# Patient Record
Sex: Male | Born: 1940 | Race: Black or African American | Hispanic: No | Marital: Married | Smoking: Never smoker
Health system: Southern US, Community
[De-identification: ages and names within clinical notes are randomized; demographics above are authoritative.]

## PROBLEM LIST (undated history)

## (undated) DIAGNOSIS — N189 Chronic kidney disease, unspecified: Secondary | ICD-10-CM

## (undated) DIAGNOSIS — I6350 Cerebral infarction due to unspecified occlusion or stenosis of unspecified cerebral artery: Secondary | ICD-10-CM

## (undated) DIAGNOSIS — E785 Hyperlipidemia, unspecified: Secondary | ICD-10-CM

## (undated) DIAGNOSIS — J189 Pneumonia, unspecified organism: Secondary | ICD-10-CM

## (undated) DIAGNOSIS — N289 Disorder of kidney and ureter, unspecified: Secondary | ICD-10-CM

## (undated) DIAGNOSIS — I69959 Hemiplegia and hemiparesis following unspecified cerebrovascular disease affecting unspecified side: Secondary | ICD-10-CM

## (undated) DIAGNOSIS — R1312 Dysphagia, oropharyngeal phase: Secondary | ICD-10-CM

## (undated) DIAGNOSIS — I10 Essential (primary) hypertension: Secondary | ICD-10-CM

## (undated) DIAGNOSIS — J9610 Chronic respiratory failure, unspecified whether with hypoxia or hypercapnia: Secondary | ICD-10-CM

## (undated) DIAGNOSIS — I639 Cerebral infarction, unspecified: Secondary | ICD-10-CM

## (undated) DIAGNOSIS — R4701 Aphasia: Secondary | ICD-10-CM

## (undated) DIAGNOSIS — I69920 Aphasia following unspecified cerebrovascular disease: Secondary | ICD-10-CM

## (undated) HISTORY — PX: STOMACH SURGERY: SHX791

## (undated) HISTORY — DX: Cerebral infarction, unspecified: I63.9

## (undated) HISTORY — DX: Aphasia following unspecified cerebrovascular disease: I69.920

## (undated) HISTORY — DX: Chronic respiratory failure, unspecified whether with hypoxia or hypercapnia: J96.10

## (undated) HISTORY — DX: Hyperlipidemia, unspecified: E78.5

## (undated) HISTORY — DX: Chronic kidney disease, unspecified: N18.9

## (undated) HISTORY — DX: Disorder of kidney and ureter, unspecified: N28.9

## (undated) HISTORY — DX: Hemiplegia and hemiparesis following unspecified cerebrovascular disease affecting unspecified side: I69.959

## (undated) HISTORY — DX: Pneumonia, unspecified organism: J18.9

## (undated) HISTORY — DX: Cerebral infarction due to unspecified occlusion or stenosis of unspecified cerebral artery: I63.50

## (undated) HISTORY — DX: Dysphagia, oropharyngeal phase: R13.12

---

## 2000-05-29 ENCOUNTER — Inpatient Hospital Stay (HOSPITAL_COMMUNITY): Admission: EM | Admit: 2000-05-29 | Discharge: 2000-05-30 | Payer: Self-pay | Admitting: *Deleted

## 2000-05-30 ENCOUNTER — Encounter: Payer: Self-pay | Admitting: *Deleted

## 2002-03-17 ENCOUNTER — Emergency Department (HOSPITAL_COMMUNITY): Admission: EM | Admit: 2002-03-17 | Discharge: 2002-03-17 | Payer: Self-pay | Admitting: Emergency Medicine

## 2002-03-17 ENCOUNTER — Encounter: Payer: Self-pay | Admitting: *Deleted

## 2002-03-31 ENCOUNTER — Emergency Department (HOSPITAL_COMMUNITY): Admission: EM | Admit: 2002-03-31 | Discharge: 2002-03-31 | Payer: Self-pay | Admitting: Emergency Medicine

## 2002-04-11 ENCOUNTER — Inpatient Hospital Stay (HOSPITAL_COMMUNITY): Admission: EM | Admit: 2002-04-11 | Discharge: 2002-04-30 | Payer: Self-pay | Admitting: Emergency Medicine

## 2002-04-11 ENCOUNTER — Encounter (INDEPENDENT_AMBULATORY_CARE_PROVIDER_SITE_OTHER): Payer: Self-pay | Admitting: *Deleted

## 2002-04-11 ENCOUNTER — Encounter: Payer: Self-pay | Admitting: Emergency Medicine

## 2002-04-11 ENCOUNTER — Encounter: Payer: Self-pay | Admitting: General Surgery

## 2002-04-16 ENCOUNTER — Encounter: Payer: Self-pay | Admitting: General Surgery

## 2002-04-25 ENCOUNTER — Encounter: Payer: Self-pay | Admitting: General Surgery

## 2005-08-12 ENCOUNTER — Ambulatory Visit (HOSPITAL_COMMUNITY): Admission: RE | Admit: 2005-08-12 | Discharge: 2005-08-12 | Payer: Self-pay | Admitting: Internal Medicine

## 2008-03-03 ENCOUNTER — Ambulatory Visit (HOSPITAL_COMMUNITY): Admission: RE | Admit: 2008-03-03 | Discharge: 2008-03-03 | Payer: Self-pay | Admitting: Internal Medicine

## 2008-05-26 ENCOUNTER — Ambulatory Visit: Payer: Self-pay | Admitting: Internal Medicine

## 2008-06-15 ENCOUNTER — Encounter: Payer: Self-pay | Admitting: Internal Medicine

## 2008-06-15 ENCOUNTER — Ambulatory Visit (HOSPITAL_COMMUNITY): Admission: RE | Admit: 2008-06-15 | Discharge: 2008-06-15 | Payer: Self-pay | Admitting: Internal Medicine

## 2008-06-15 ENCOUNTER — Ambulatory Visit: Payer: Self-pay | Admitting: Internal Medicine

## 2008-09-20 ENCOUNTER — Ambulatory Visit: Payer: Self-pay | Admitting: Internal Medicine

## 2010-07-04 ENCOUNTER — Encounter (INDEPENDENT_AMBULATORY_CARE_PROVIDER_SITE_OTHER): Payer: Self-pay | Admitting: Internal Medicine

## 2010-07-04 ENCOUNTER — Ambulatory Visit: Payer: Self-pay | Admitting: Cardiology

## 2010-07-04 ENCOUNTER — Inpatient Hospital Stay (HOSPITAL_COMMUNITY): Admission: EM | Admit: 2010-07-04 | Discharge: 2010-07-09 | Payer: Self-pay | Admitting: Emergency Medicine

## 2010-07-09 ENCOUNTER — Inpatient Hospital Stay: Admission: AD | Admit: 2010-07-09 | Discharge: 2010-07-16 | Payer: Self-pay | Admitting: Internal Medicine

## 2010-11-15 ENCOUNTER — Encounter (HOSPITAL_COMMUNITY): Payer: Self-pay

## 2010-11-15 ENCOUNTER — Other Ambulatory Visit (HOSPITAL_COMMUNITY): Payer: Self-pay | Admitting: Internal Medicine

## 2010-11-15 ENCOUNTER — Ambulatory Visit (HOSPITAL_COMMUNITY)
Admission: RE | Admit: 2010-11-15 | Discharge: 2010-11-15 | Disposition: A | Discharge status: Home / Self Care | Payer: Medicare HMO | Source: Ambulatory Visit | Attending: Internal Medicine | Admitting: Internal Medicine

## 2010-11-15 DIAGNOSIS — R059 Cough, unspecified: Secondary | ICD-10-CM | POA: Insufficient documentation

## 2010-11-15 DIAGNOSIS — R05 Cough: Secondary | ICD-10-CM

## 2010-11-15 HISTORY — DX: Essential (primary) hypertension: I10

## 2010-12-26 LAB — GLUCOSE, CAPILLARY
Glucose-Capillary: 178 mg/dL — ABNORMAL HIGH (ref 70–99)
Glucose-Capillary: 190 mg/dL — ABNORMAL HIGH (ref 70–99)
Glucose-Capillary: 205 mg/dL — ABNORMAL HIGH (ref 70–99)

## 2010-12-27 LAB — GLUCOSE, CAPILLARY
Glucose-Capillary: 112 mg/dL — ABNORMAL HIGH (ref 70–99)
Glucose-Capillary: 125 mg/dL — ABNORMAL HIGH (ref 70–99)
Glucose-Capillary: 131 mg/dL — ABNORMAL HIGH (ref 70–99)
Glucose-Capillary: 131 mg/dL — ABNORMAL HIGH (ref 70–99)
Glucose-Capillary: 131 mg/dL — ABNORMAL HIGH (ref 70–99)
Glucose-Capillary: 141 mg/dL — ABNORMAL HIGH (ref 70–99)
Glucose-Capillary: 141 mg/dL — ABNORMAL HIGH (ref 70–99)
Glucose-Capillary: 158 mg/dL — ABNORMAL HIGH (ref 70–99)
Glucose-Capillary: 163 mg/dL — ABNORMAL HIGH (ref 70–99)
Glucose-Capillary: 165 mg/dL — ABNORMAL HIGH (ref 70–99)
Glucose-Capillary: 166 mg/dL — ABNORMAL HIGH (ref 70–99)
Glucose-Capillary: 175 mg/dL — ABNORMAL HIGH (ref 70–99)
Glucose-Capillary: 175 mg/dL — ABNORMAL HIGH (ref 70–99)
Glucose-Capillary: 201 mg/dL — ABNORMAL HIGH (ref 70–99)
Glucose-Capillary: 238 mg/dL — ABNORMAL HIGH (ref 70–99)

## 2010-12-27 LAB — LIPID PANEL
Cholesterol: 121 mg/dL (ref 0–200)
Total CHOL/HDL Ratio: 3 RATIO
VLDL: 16 mg/dL (ref 0–40)

## 2010-12-27 LAB — POCT I-STAT, CHEM 8
BUN: 33 mg/dL — ABNORMAL HIGH (ref 6–23)
Chloride: 112 mEq/L (ref 96–112)
Potassium: 4.7 mEq/L (ref 3.5–5.1)
Sodium: 139 mEq/L (ref 135–145)
TCO2: 20 mmol/L (ref 0–100)

## 2010-12-27 LAB — DIFFERENTIAL
Basophils Absolute: 0 10*3/uL (ref 0.0–0.1)
Eosinophils Relative: 2 % (ref 0–5)
Lymphocytes Relative: 8 % — ABNORMAL LOW (ref 12–46)
Neutro Abs: 9.7 10*3/uL — ABNORMAL HIGH (ref 1.7–7.7)
Neutrophils Relative %: 85 % — ABNORMAL HIGH (ref 43–77)

## 2010-12-27 LAB — POCT CARDIAC MARKERS: Myoglobin, poc: 80.6 ng/mL (ref 12–200)

## 2010-12-27 LAB — CBC
Platelets: 180 10*3/uL (ref 150–400)
RDW: 13.4 % (ref 11.5–15.5)
WBC: 11.4 10*3/uL — ABNORMAL HIGH (ref 4.0–10.5)

## 2010-12-27 LAB — COMPREHENSIVE METABOLIC PANEL
BUN: 21 mg/dL (ref 6–23)
CO2: 28 mEq/L (ref 19–32)
Calcium: 9.5 mg/dL (ref 8.4–10.5)
Creatinine, Ser: 2.2 mg/dL — ABNORMAL HIGH (ref 0.4–1.5)
GFR calc Af Amer: 36 mL/min — ABNORMAL LOW (ref >60)
GFR calc non Af Amer: 30 mL/min — ABNORMAL LOW (ref >60)
Glucose, Bld: 139 mg/dL — ABNORMAL HIGH (ref 70–99)

## 2010-12-27 LAB — VITAMIN B12: Vitamin B-12: 469 pg/mL (ref 211–911)

## 2011-02-26 NOTE — Op Note (Signed)
Brady Frazier, Brady Frazier              ACCOUNT NO.:  [id number]   MEDICAL RECORD NO.:  [id number]          PATIENT TYPE:  AMB   LOCATION:  DAY                           FACILITY:  [facility]   PHYSICIAN:  R. Roetta Sessions, M.D. DATE OF BIRTH:  November 16, 1940   DATE OF PROCEDURE:  06/15/2008  DATE OF DISCHARGE:                               OPERATIVE REPORT   PROCEDURE:  Colonoscopy with biopsy.   INDICATIONS FOR PROCEDURE:  A 70 year old gentleman with chronic  constipation of at least 2 years' duration.  He has intermittent low-  volume hematochezia setting with straining.  He has never had his lower  GI tract evaluated.  There is no family history of colorectal neoplasia.  Colonoscopy is now being done.  Risks, benefits, alternatives, and  limitations have been reviewed, questions answered, and he is agreeable.  Please see the documentation in the medical record.   PROCEDURE NOTE:  O2 saturation, blood pressure, pulse, and respirations  were monitored throughout the entire procedure.   CONSCIOUS SEDATION:  Versed 3 mg IV and Demerol 75 mg IV in divided  doses.   INSTRUMENT:  Pentax video chip system.   FINDINGS:  Digital rectal exam revealed no abnormalities.  Endoscopic  findings:  Prep was good.  Colon:  Colonic mucosa was surveyed from the  rectosigmoid junction through the left transverse, right colon,  appendiceal orifice, ileocecal valve, and cecum.  These structures were  well seen and photographed for the record.  From this level, scope was  slowly withdrawn.  All previously mentioned mucosal surfaces were again  cautiously reviewed.  The colonic mucosa appeared normal aside from a  single 2-mm polyp in the mid sigmoid colon, which was cold  biopsied/removed.  The scope was pulled down the rectum with thorough  examination of the rectal mucosa, including an en face view of the anal  canal demonstrated friable anal canal tissue, otherwise it was  unremarkable.  The patient tolerated  the procedure well and was reacted  to Endoscopy.   IMPRESSION:  Friable anal canal, otherwise normal rectum.  Diminutive  mid sigmoid polyp status post cold biopsy removal.  The remainder of  colonic mucosa appeared normal.   RECOMMENDATIONS:  1. Constipation/high-fiber diet literature provided to [name].      Daily fiber supplement with Benefiber and Metamucil.  2. MiraLax 17 g orally at bedtime p.r.n. constipation.  3. Avoid straining.  4. Follow up on path.  5. Further recommendations to follow.      Jonathon Bellows, M.D.  Electronically Signed     RMR/MEDQ  D:  06/15/2008  T:  06/15/2008  Job:  [id number]   cc:   Kingsley Callander. Ouida Sills, MD  Fax: [phone]

## 2011-02-26 NOTE — Assessment & Plan Note (Signed)
Brady Frazier, Brady Frazier                 CHART#:  [id number]   DATE:  09/20/2008                       DOB:  [birth year]   CHIEF COMPLAINT:  Followup constipation.   SUBJECTIVE:  The patient is here for 55-months followup of recent  colonoscopy.  He has a history of chronic constipation with intermittent  low- volume hematochezia in the setting of straining.  Colonoscopy in  September 2009, revealed friable anal canal, diminutive mid sigmoid  polyp, which was hyperplastic.  He was started on MiraLax and fiber  supplement.  He states that he is having daily bowel movements and is  using MiraLax about twice a week.  He uses fiber p.r.n. as well.  He is  very pleased with his bowel function at this time.  Denies any blood in  the stool or melena.  No abdominal pain, nausea, vomiting, heartburn,  dysphagia, or odynophagia.   CURRENT MEDICATIONS:  See updated list.   ALLERGIES:  No known drug allergies.   PHYSICAL EXAMINATION:  VITAL SIGNS:  Weight 196, stable; temp 97.8;  blood pressure 120/68; and pulse 72.  GENERAL:  A very pleasant, elderly gentleman in no acute distress.  SKIN:  Warm and dry.  No jaundice.  HEENT:  Sclerae nonicteric.  Oropharyngeal mucosa moist and pink.  ABDOMEN:  Positive bowel sounds.  Abdomen is soft, nontender, and  nondistended.   IMPRESSION:  The patient is a 70 year old gentleman with chronic  constipation, doing very well on p.r.n. MiraLax.  He has had no further  hematochezia.   PLAN:  1. Colonoscopy in 10 years, health permitting.  2. Continue MiraLax 17 g daily p.r.n.  3. Continue fiber supplement daily.  4. Office visit on an as needed basis.       Tana Coast, P.A.  Electronically Signed     R. Roetta Sessions, M.D.  Electronically Signed    LL/MEDQ  D:  09/20/2008  T:  09/21/2008  Job:  [id number]   cc:   Kingsley Callander. Ouida Sills, MD

## 2011-02-26 NOTE — Consult Note (Signed)
Brady Frazier, Brady Frazier              ACCOUNT NO.:  [id number]   MEDICAL RECORD NO.:  [id number]          PATIENT TYPE:  AMB   LOCATION:  DAY                           FACILITY:  [facility]   PHYSICIAN:  R. Roetta Sessions, M.D. DATE OF BIRTH:  23-May-1941   DATE OF CONSULTATION:  05/26/2008  DATE OF DISCHARGE:                                 CONSULTATION   REFERRING PHYSICIAN:  Kingsley Callander. Ouida Sills, MD   REASON FOR CONSULTATION:  Constipation/colonoscopy.   HISTORY OF PRESENT ILLNESS:  Brady Frazier is a 69 year old African  American male with type 2 diabetes mellitus referred out of  the  courtesy of Dr. Carylon Perches for further evaluation of constipation and  consideration of colonoscopy.  Brady Frazier has never had his lower GI  tract evaluated.  He has had a couple of years' history of constipation.  He has difficulty evacuating and initiating a bowel movement.  He has a  bowel movement only three times weekly or every other day.  He has tried  a variety of laxatives and stool softeners with varying results.  He  does have occasional small-volume rectal bleeding when he is attempting  to have a bowel movement.  There is no family history of colorectal  neoplasia.  He has not lost any weight.  According to Dr. Alonza Smoker  records, he was Hemoccult negative back in May 2009.  He has had some  vague reflux symptoms in the past treated with Nexium; those were self-  limiting, and he is not on aspiration therapy now.  He denies  odynophagia, dysphagia, early satiety, reflux symptoms, nausea,  vomiting, abdominal pain, or melena currently.  He has not really ever  had any diarrhea.   PAST MEDICAL HISTORY:  1. Significant for type 2 diabetes mellitus.  2. Hypertension.  3. 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  4. History of anemia.   PAST SURGERIES:  Cholecystectomy and laparotomy for what sounds like  small bowel obstruction.   CURRENT MEDICATIONS:  1. Plendil 5 mg daily.  2.  Lisinopril 10 mg daily.  3. Metformin 500 mg b.i.d.  4. Glipizide b.i.d.  5. ASA 81 mg daily.  6. Fish oil daily.   ALLERGIES:  No known drug allergies.   FAMILY HISTORY:  Negative for chronic GI or liver illness.  Father is  deceased, cause unknown.  Mother died at age 34 with diabetes-related  problems.   SOCIAL HISTORY:  The patient is married.  He has 4 children.  He is not  employed.  No tobacco.  No alcohol.  No illicit drugs.   REVIEW OF SYSTEMS:  GI:  As above.  No recent chest pain or dyspnea on  exertion.  No fever or chills.  No change in weight.   PHYSICAL EXAMINATION:  GENERAL:  Pleasant 70 year old gentleman resting  comfortably.  VITAL SIGNS:  Weight 195, height 5 feet 6-1/2 inches, temperature 98.2,  BP 118/80, and pulse 72.  SKIN:  Warm and dry.  HEENT:  No scleral icterus.  CHEST:  Lungs are clear to auscultation.  CARDIAC:  Regular  rate and rhythm without murmur, gallop, or rub.  ABDOMEN:  Nondistended.  Well-healed midline laparotomy scar.  Positive  bowel sounds.  Soft and nontender without appreciable mass or  hepatosplenomegaly.  EXTREMITIES:  No edema.  RECTAL:  Deferred at the time of colonoscopy.   IMPRESSION:  Brady Frazier is a 70 year old gentleman with at least  2-year history of constipation.  He has intermittent low-volume  hematochezia at the time of bowel movements with occasional straining.  I agree with Dr. Ouida Sills since he has never had his lower GI tract  evaluated, he needs a colonoscopy run off the back.  Risks, benefits,  alternatives, and limitations have been reviewed.  Brady Frazier  questions are answered.  He is agreeable.  We will plan to perform a  diagnostic colonoscopy in the very near future and then we will make  further recommendations relating to the management of his constipation.   I would like to thank Dr. Carylon Perches for allowing me to see this very  nice gentleman today.      Jonathon Bellows, M.D.   Electronically Signed     RMR/MEDQ  D:  05/26/2008  T:  05/27/2008  Job:  [id number]   cc:   Kingsley Callander. Ouida Sills, MD  Fax: [phone]

## 2011-03-01 NOTE — Discharge Summary (Signed)
Brady Frazier, Brady Frazier                          ACCOUNT NO.:  [id number]   MEDICAL RECORD NO.:  [id number]                   PATIENT TYPE:  INP   LOCATION:  2019                                 FACILITY:  [facility]   PHYSICIAN:  Margaretann Loveless, M.D.              DATE OF BIRTH:  [birth year]   DATE OF ADMISSION:  04/11/2002  DATE OF DISCHARGE:  04/30/2002                                 DISCHARGE SUMMARY   DISCHARGE DIAGNOSES:  1. Abdominal pain secondary to gallstone pancreatitis.  Transferred to [facility] for surgery.  2. Diabetes with Accu-Cheks well controlled on glipizide 5 mg b.i.d.  3. Hypercholesterolemia, on Lipitor during this hospitalization.  4. Hypertension with blood pressure well controlled on Altace.  5. Anemia with a hemoglobin of 11.1 during this hospitalization.  No workup     done as patient was transferred to [facility].  Workup can be done there     if indicated.   DISCHARGE MEDICATIONS:  1. Glipizide 5 mg t.i.d.  2. Lipitor 10 mg q.h.s.  3. Enteric-coated aspirin 325 mg q.d., which is on hold at this time.  4. Altace 5 mg p.o. q. a.m.  5. Glucophage, which is on hold at this time.   DISPOSITION:  The patient will be transferred to [facility] for surgery  secondary to gallstone pancreatitis.   HISTORY AND PHYSICAL:  Please refer to dictated history and physical.   LABORATORY DATA:  The patient's white cell count on admission was 16.2, his  hemoglobin was 11.1, platelets 271,000.  His chemistry panel showed his  blood sugar elevated at 170, BUN 17, creatinine 1.4.  The electrolytes were  all normal.  His AST 243, ALT 149, alkaline phosphatase 98, and total bili  1.6, amylase 159, lipase 73.  His blood cultures showed no growth.   DIAGNOSTIC DATA:  CT scan showed gallstones with evidence consistent with  gallbladder wall thickening raising the question of acute cholecystitis.  It  also showed questionable inflammation of the distal aspect of the  appendix.   HOSPITAL COURSE:  Problem #1 - Gallstone pancreatitis:  On admission, the  patient had abdominal pain worrisome for gallstones.  His CT scan did show  gallstones and a thickened gallbladder wall consistent with cholecystitis.  His amylase and lipase were elevated consistent with gallstone pancreatitis.  General surgery consultation was obtained and Dr. Lovell Sheehan agreed with the  Unasyn and IV hydration.  He scheduled the patient for laparoscopic  cholecystectomy with cholangiogram but the patient wished this to be done in  [place] at [facility].  He was subsequently transferred to [facility] for  surgery.   Problem #2 - Questionable appendicitis:  This issue will be dealt with by  his surgeon in [place].   Problem #3 - Diabetes.  He was maintained on glipizide and his blood sugars  were stable on sliding scale insulin and Accu-Cheks.  Problem #4 - Hypertension:  Blood pressure stable on Altace.                                               Margaretann Loveless, M.D.    RLJ/MEDQ  D:  05/16/2002  T:  05/20/2002  Job:  [id number]   cc:   Kingsley Callander. Ouida Sills, M.D.

## 2011-03-01 NOTE — Discharge Summary (Signed)
[place]. [facility]  Patient:    Brady Frazier, Brady Frazier Visit Number: [id number] MRN: [id number]          Service Type: MED Location: 2000 2019 01 Attending Physician:  Jackie Plum Dictated by:   Vikki Ports, M.D. Admit Date:  04/11/2002 Discharge Date: 04/30/2002   CC:         Dr. Gray Bernhardt in [place], [place]   Discharge Summary  ADMISSION DIAGNOSIS:  Biliary pancreatitis.  DISCHARGE DIAGNOSES: 1. Biliary pancreatitis. 2. Hypertension. 3. Pulmonary embolus. 4. Non-insulin dependent diabetes mellitus.  PROCEDURES PERFORMED: 1. Laparoscopic cholecystectomy. 2. Exploratory laparotomy with lysis of adhesions.  CONDITION ON DISCHARGE:  Good and improved.  DISPOSITION:  The patient is discharged to home.  HISTORY OF PRESENT ILLNESS:  The patient is a 70 year old male who was seen at [facility] on April 10, 2002, with complaints of abdominal pain. An ultrasound and a CT scan showed multiple gallstones and minimal inflammation of the pancreas but a thickened gallbladder wall. The patient requested transfer down to [facility]. I accepted the patient in transfer.  HOSPITAL COURSE:  He was admitted and placed on bowel rest, IV antibiotics and IV hydration. His temperature came down from 102 to 100.6 by hospital day #2. On the following day he underwent laparoscopic cholecystectomy with intraoperative cholangiogram.  On postoperative day #1 the patient developed fever to 104.2 and had a mildly distended abdomen. White count was 12,000. On postoperative day #2 his temperature came down to 101.3. He was continued on antibiotics.  On postoperative day #3 the patient was vomiting. He had marked distention of the abdomen. He underwent a CT scan which showed umbilical hernia with small bowel involvement in a transition zone. The patient also had developed renal insufficiency, oliguria, and was transferred to the intensive care unit  and taken to the operating room by Dr. Magnus Ivan, where he underwent exploratory laparotomy and repair of hernia. Postoperatively he was maintained on IV hydration. His creatinine slowly decreased from a peak of 5.6 down to over the next few weeks 1.5.  His bowel function was slow to return. His nasogastric tube was pulled out on postoperative day #2. He was started on only a few sips of clears. He was transferred back to the floor. His creatinine continued to improve, however, he was having uncontrolled hypertension and was started on Tenormin. He continued to have uncontrolled hypertension and the hospitalists were consulted. They started the patient on Norvasc and a Catapres patch, which controlled his hypertension nicely.  Over the following few days he continued to improve. He was tolerating a regular diet. However, on postoperative day #9, the patient had significant shortness of breath, was slightly confused. He had decreased oxygen saturations at 90% on 2 liters. The hospitalists requested a V/Q scan which showed high probability for a pulmonary embolism bilaterally. The patient was started on Lovenox and Coumadin, and required about 4 or 5 days of bimodal therapy until he was therapeutic with an INR of 2.1 on April 30, 2002. At this point he was ready for discharge to home.  DISCHARGE MEDICATIONS: 1. Tenormin 100 mg q.d. 2. Norvasc 10 mg q.d. 3. Coumadin 5 mg q.d. 4. We were holding Altace and glucophage until his primary doctor can see him. 5. Continues on Glipizide 10 mg q.d. 6. Aspirin 81 mg q.d. 7. Lipitor 10 mg q.d.  FOLLOWUP: Followup is with me in two weeks and with Dr. Gray Bernhardt in [place]. Dictated by:   Nicholaus Corolla  Luan Pulling, M.D. Attending Physician:  Jackie Plum DD:  04/30/02 TD:  05/05/02 Job: [id number]

## 2011-03-01 NOTE — H&P (Signed)
[place]. [facility]  Patient:    Brady Frazier, Brady Frazier Visit Number: [id number] MRN: [id number]          Service Type: MED Location: 2000 2019 01 Attending Physician:  Jackie Plum Dictated by:   Renne Musca, M.D. Admit Date:  04/11/2002 Discharge Date: 04/30/2002                           History and Physical  DATE OF BIRTH:  21-Feb-1941  HISTORY OF PRESENT ILLNESS:  The patient is a 70 year old African American gentleman with a past medical history remarkable for diabetes mellitus, history of CVA approximately two years prior with no residual, who presents with a four- to five-week history of worsening abdominal pain with nausea and vomiting.  Initially, the pain was intermittent.  He was seen in the emergency room at [facility] two weeks prior and was diagnosed with diverticulitis.  At that time, his white count was normal.  Because of ongoing symptoms including difficulty maintaining p.o.s, he was seen in the [facility] ER one week ago; no labs are in the computer.  However, he did note he had seen a small amount of blood per rectum at that time and continued to feel poorly, though he had no diarrhea.  He was scheduled for colonoscopy at that time, once his "stomach healed."  He was told he had gastritis.  The pain has continued.  He generally feels poor and weak.  He is not able to maintain any p.o.s; he, however, did not vomit again until yesterday after drinking apple juice and an apple.  He has not worked during this time due to the ongoing symptoms and generally feeling poorly.  He is quite active as a Scientist, water quality.  Tonight, he presents with these symptoms including the nausea and vomiting.  He has had no fever or chills, no diarrhea, no shortness of breath, no chest pain or cardiovascular-type symptoms.  His white count tonight is 16,000 with a left shift.  His hemoglobin is 11, although this is not significantly changed, though I  did not have any labs from prior to June.  In addition, his transaminases are markedly elevated with SGPT of ______, SGOT of 243, SGPT of 149.  The patient is also noted to have lipase of 73, amylase 159 with a BUN of 17 and creatinine of 1.4.  Wife has noted his glucoses have been moderately elevated compared to his baseline over this period.  CT scan reveals a suspected gallbladder wall thickening and some small stones, as well as thickening of the appendix as well and non-filling consistent with appendicitis.  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 Glucophage 500 mg b.i.d. 2. Glipizide 10 mg b.i.d. 3. Lipitor 10 mg daily. 4. Aspirin. 5. Altace 5 mg daily.  SOCIAL HISTORY:  No alcohol.  No tobacco.  He is married.  He has four children who are alive and well.  He is a Scientist, water quality.  FAMILY MEDICAL HISTORY:  Positive for diabetes, hypertension and colon cancer. He has four siblings, I believe.  PAST MEDICAL HISTORY: 1. Diabetes mellitus.  Control has been good per the patients wife. 2. Hypertension. 3. CVA -- manifest as peripheral vision loss which had resolved.  ALLERGIES:  None known.  PHYSICAL EXAMINATION:  VITAL SIGNS:  Blood pressure is 120/70.  Pulse is 84 and regular.  GENERAL:  He is lying supine, in no  distress now after 8 mg of morphine.  NECK:  Supple.  There is no JVD, adenopathy or thyromegaly.  LUNGS:  Clear to auscultation anteriorly and posteriorly.  Respiratory rate is 14.  ABDOMEN:  Protuberant.  Hyperactive bowel sounds.  Mild diffuse tenderness but more marked tenderness in the right upper quadrant and right mid-lower abdomen.  No frank rebound is noted.  EXTREMITIES:  Without clubbing, cyanosis, or edema.  There are good distal pulses bilaterally.  NEUROLOGIC:  Exam is nonfocal.  Gait is not tested.  The patient is awake, easily arouses.  SKIN:  Without rash, lesion or breakdown.  ASSESSMENT AND PLAN:   Abdominal pain with increased transaminases, increased pancreatic enzymes, nausea and vomiting with a question of passed gallstone as well as appendix changes on CT scan.  Surgical consultation has been obtained. Empiric Unasyn.  Will maintain the patient as sips and chips.  The patient had been on Glucophage and has had a CT scan with some renal insufficiency.  We will continue aggressive intravenous hydration and monitor for any evidence of volume overload.  Hold Glucophage for the next 48 hours and Accu-Cheks.  I am going to continue his glipizide at 5 mg b.i.d. and a sliding-scale insulin to continue his management.  The plan was discussed with the patient and his family, who seem to have reasonable understanding. Dictated by:   Renne Musca, M.D. Attending Physician:  Jackie Plum DD:  04/11/02 TD:  04/12/02 Job: [id number]

## 2011-03-01 NOTE — Consult Note (Signed)
[facility]  Patient:    Brady Frazier, Brady Frazier Visit Number: [id number] MRN: [id number]          Service Type: MED Location: [phone] Attending Physician:  Brady Frazier Dictated by:   Brady Frazier, M.D. Proc. Date: 04/11/02 Admit Date:  04/11/2002   CC:         Brady Frazier, M.D.  Brady Frazier, M.D.   Consultation Report  PATIENT AGE:  70 years old.  REASON FOR CONSULTATION:  Gallstone pancreatitis, question appendicitis.  REFERRING PHYSICIANS:  Brady Frazier, M.D. and Brady Frazier, M.D.  HISTORY OF PRESENT ILLNESS:  The patient is a 70 year old black male who has had a several-week history of recurring abdominal pain.  He has been seen by multiple physicians in the emergency room in the past.  Earlier this morning, he was seen in the emergency room at [facility] and was noted to have a leukocytosis as well as elevated transaminase as well as elevated amylase and lipase.  A CT scan of the abdomen and pelvis was performed which revealed cholecystitis with cholelithiasis.  The gallbladder wall was thickened.  There was also a question of whether or not he had appendicitis.  The patient has had nonspecific abdominal pain and nausea.  He states this has been intermittent in nature.  He has also had generalized fatigue.  He denies any fever or chills.  He denies any jaundice.  PAST MEDICAL HISTORY: 1. Non-insulin-dependent diabetes mellitus. 2. Hypertension. 3. High cholesterol levels.  PAST MEDICAL HISTORY:  Unremarkable.  CURRENT MEDICATIONS: 1. Glucophage 500 mg p.o. b.i.d. 2. Glipizide 10 mg p.o. b.i.d. 3. Lipitor 10 mg p.o. q.d. 4. Aspirin 1 tablet p.o. q.d. 5. Altace 5 mg p.o. q.d.  ALLERGIES:  No known drug allergies.  REVIEW OF SYSTEMS:  Unremarkable.  PHYSICAL EXAMINATION:  GENERAL:  The patient is a pleasant 70 year old black male in no acute distress.  VITAL SIGNS:  He is afebrile.  Vital signs are  stable.  ABDOMEN:  Soft but protuberant.  He is tender diffusely in all quadrants, though specifically in the right upper quadrant.  He has no hepatosplenomegaly or rigidity.  No hernias are noted.  RECTAL:  Examination was deferred at this time.  DIAGNOSTIC DATA:  White blood cell count 16.2, hematocrit 33, platelet count 271.  Amylase 159, lipase 73.  MET-7 within normal limits except for glucose of 170.  SGOT is 243, SGPT 159, alkaline phosphatase 98, total bilirubin 1.6.  I reviewed the CT scan with radiology this morning.  It is their feeling that he does have a thickened gallbladder wall and edema as well as cholelithiasis. The pancreas is within normal limits.  In looking at the appendix, the appendix is filled with either contrast or air.  It does not have any periappendiceal process, thus making the diagnosis of appendicitis unlikely.  IMPRESSION:  Cholecystitis, cholelithiasis, with gallstone pancreatitis.  PLAN:  The patient is scheduled for a laparoscopic cholecystectomy with cholangiogram tomorrow.  The risks and benefits of the procedure including bleeding, infection, hepatobiliary injury, the possibility of an open procedure were fully explained to the patient who gave informed consent.  Agree with continuing Unasyn and intravenous hydration.  Repeat liver enzymes as well as pancreatic enzymes have been ordered. Dictated by:   Brady Frazier, M.D. Attending Physician:  Brady Frazier DD:  04/11/02 TD:  04/12/02 Job: [id number]

## 2011-03-01 NOTE — Op Note (Signed)
[place]. [facility]  Patient:    Brady Frazier, Brady Frazier Visit Number: [id number] MRN: [id number]          Service Type: MED Location: [phone] Attending Physician:  Vikki Ports. Dictated by:   Abigail Miyamoto, M.D. Proc. Date: 04/16/02 Admit Date:  04/11/2002                             Operative Report  PREOPERATIVE DIAGNOSIS:  Small bowel obstruction.  POSTOPERATIVE DIAGNOSIS:  Small bowel obstruction.  OPERATION PERFORMED:  Exploratory laparotomy.  SURGEON:  Abigail Miyamoto, M.D.  ASSISTANT:  Sheppard Plumber. Earlene Plater, M.D.  ANESTHESIA:  General endotracheal anesthesia.  ESTIMATED BLOOD LOSS:  Minimal.  INDICATIONS FOR PROCEDURE:  Brady Frazier is a 70 year old gentleman who underwent a laparoscopic cholecystectomy several days ago.  He presented with nausea, vomiting, worsening NG output as well as an increase in his white blood cell count, creatinine that increased to 5 and increasing liver function tests.  Given these findings, a CAT scan was performed which showed a partial small bowel obstruction with bowel at the umbilicus.  Given these findings a decision was made to take the patient to the operating room for exploratory laparotomy.  OPERATIVE FINDINGS:  The patient indeed was found to have a piece of obstructed small bowel at the umbilicus.  There was no evidence of ischemia of this.  The right upper quadrant was examined and found to be normal with only postoperative changes and no evidence of bile leak.  The cecum was examined also and found to have only postoperative changes and no evidence of perforation.  DESCRIPTION OF PROCEDURE:  Patient brought to operating room and identified as Brady Frazier.  He was placed supine on the operating table and general anesthesia was induced.  His abdomen was then prepped and draped in the usual sterile fashion.  Using a #10 blade, a midline incision was then created. Incision was carried  down through the fascia with the electrocautery.  The peritoneum was then opened the entire length of the incision.  The small bowel was examined and was found to be incarcerated into a hernia where the suture had broken through the umbilicus.  The skin incision was taken further down to include the umbilicus and the fascial defect was incorporated into the midline.  The bowel was examined and no ischemia was identified.  The rest of the small bowel was examined from the ligament of Treitz to the terminal ileum and again no defect was found.  The right upper quadrant was then examined and the liver bed was evaluated and there was no evidence of gross hemorrhage there.  The cecum was also examined and there was evidence of the surgical changes but no evidence of bowel injury.  At this point the abdomen was copiously irrigated with normal saline.  Hemostasis appeared to be achieved. The midline fascia was then closed with running #1 PDS suture.  The skin was then irrigated and closed with skin staples.  The patient tolerated the procedure well.  Sponge, needle and instrument counts were correct at the end of the procedure.  The patient was then extubated in the operating room and taken in stable condition to the recovery room. Dictated by:   Abigail Miyamoto, M.D. Attending Physician:  Danna Hefty R. DD:  04/16/02 TD:  04/20/02 Job: [id number]

## 2011-03-01 NOTE — Discharge Summary (Signed)
[place]. [facility]  Patient:    Brady Frazier, Brady Frazier                       MRN: [id number] Adm. Date:  [id number] Disc. Date: [id number] Attending:  Enos Fling CC:         Candy Sledge, M.D., admitting physician                           Discharge Summary  DATE OF BIRTH:  [birth year].  ADMITTING PHYSICIAN:  Candy Sledge, M.D.  ADMISSION DIAGNOSES: 1. Left posterior cerebral artery stroke. 2. Diabetes mellitus.  DISCHARGE DIAGNOSES: 1. 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 2. Diabetes mellitus.  CONDITION AT DISCHARGE:  Stable.  DISCHARGE DIET:  A 2200 calorie ADA diet.  DISCHARGE ACTIVITY:  Ad lib except for no driving.  MEDICATIONS AT DISCHARGE: 1. Plavix 75 mg p.o. q.d. 2. Enteric coated aspirin 325 mg p.o. q.d. 3. Glucophage 500 mg p.o. b.i.d. 4. Glucotrol XL 10 mg p.o. b.i.d.  STUDIES: 1. MRI/MRA of the brain revealing acute left posterior cerebral artery    distribution stroke with moderate degree of hemorrhagic transformation. 2. MRA of the intracranial vessels revealing no significant stenosis    except for distal left posterior cerebral artery branches. 3. Echocardiogram revealing good left ventricular function and EF of about    60%, mild left ventricular hypertrophy and no evident cardiac source of    emboli. 4. Carotid Doppler study revealing no hemodynamically significant carotid    stenosis.  HOSPITAL COURSE:  Please see admission H&P for full details.  Briefly, this is a 70 year old gentleman with history of diabetes who experienced nausea, vomiting and visual changes on Monday.  He was seen at [facility] ER where a CT was said to demonstrate a stroke.  He was discharged to home and subsequently came here for further work-up.  He was admitted on Thursday, May 29, 2000, from the emergency room for work-up of a stroke.  He underwent the usual work-up including  MRI/MRA, echocardiogram and Dopplers which were unrevealing except for local disease in the left posterior cerebral artery distribution.  He was placed on aspirin and Plavix for stroke prophylaxis.  He was ambulatory throughout his hospitalization and complained only of a mild headache.  Medications for his diabetes and blood sugar control remained reasonably good throughout his hospitalization.  He had no further strokelike events.  As his work-up was negative, he was deemed stable for discharge on combination antiplatelet therapy with aspirin and Plavix and was discharged on the evening of May 30, 2000.  DISCHARGE INSTRUCTIONS: 1. He is to follow up with his primary care physician within one week. 2. He may follow up with [facility] as needed. DD:  05/30/00 TD:  06/02/00 Job: [id number]

## 2011-03-01 NOTE — Op Note (Signed)
[place]. [facility]  Patient:    Brady Frazier, Brady Frazier Visit Number: [id number] MRN: [id number]          Service Type: MED Location: [phone] Attending Physician:  Estella Husk Dictated by:   Earna Coder, M.D. Proc. Date: 04/13/02 Admit Date:  04/11/2002                             Operative Report  PREOPERATIVE DIAGNOSIS:  Rule out acute appendicitis, and acute cholecystitis.  POSTOPERATIVE DIAGNOSIS:  Acute cholecystitis and normal retrocecal appendix.  OPERATION PERFORMED:  Laparoscopic cholecystectomy with intraoperative cholangiogram.  SURGEON:  Stephenie Acres, M.D.  ASSISTANT:  Anselm Pancoast. Zachery Dakins, M.D.  ANESTHESIA:  General.  DESCRIPTION OF PROCEDURE:  The patient was taken to the operating room and placed in supine position.  After adequate anesthesia was induced, the abdomen was prepped and draped in normal sterile fashion.  Using a transverse infraumbilical incision, I dissected down to the fascia.  This was opened vertically.  A second 10 mm trocar was placed in the subxiphoid region after pneumoperitoneum had been obtained.  Extensive exploration was undertaken to identify the appendix which was in a very difficult location to see.  Three separate 5 mm ports were placed in the right abdomen.  The cecum was retracted and mobilized completely until a normal-appearing completely retrocecal appendix was identified.  The gallbladder was then identified, was very tense, was aspirated, was grasped and retracted cephalad.  Dissection down at the infundibulum identified the cystic duct which was clipped.  Ductotomy was made.  Cholangiogram was performed with a Reddick catheter which showed free flow into the common bile duct and down into the duodenum.  Both left and right hepatic ducts also filled nicely.  The Reddick catheter was removed. The cystic duct was doubly clipped and divided.  The cystic artery was  then identified, triply clipped and divided.  The gallbladder was taken off the gallbladder bed using Bovie electrocautery, placed in an Endocatch bag as a number of small holes were made in the gallbladder with loss of a number of small stones.  It was placed in an endocatch bag.  The right upper quadrant was copiously irrigated with 5L of saline solution.  All stones had been removed.  The Endo bag was removed, the remainder of the abdomen was copiously irrigated.  All trocars were removed after a pneumoperitoneum had been released.  Incisions were injected using 0.5 Marcaine.  Fascial defect was closed with the 0 Vicryl suture.  Skin incisions were closed with staples. The patient tolerated the procedure well and went to PACU in good condition.Dictated by:   Earna Coder, M.D. Attending Physician:  Estella Husk DD:  04/13/02 TD:  04/15/02 Job: [id number]

## 2011-07-17 LAB — GLUCOSE, CAPILLARY: Glucose-Capillary: 111 — ABNORMAL HIGH

## 2012-10-01 ENCOUNTER — Ambulatory Visit (INDEPENDENT_AMBULATORY_CARE_PROVIDER_SITE_OTHER): Payer: Medicare HMO | Admitting: Orthopedic Surgery

## 2012-10-01 ENCOUNTER — Encounter: Payer: Self-pay | Admitting: Orthopedic Surgery

## 2012-10-01 ENCOUNTER — Ambulatory Visit (INDEPENDENT_AMBULATORY_CARE_PROVIDER_SITE_OTHER): Payer: Medicare HMO

## 2012-10-01 VITALS — BP 130/72 | Ht 67.0 in | Wt 191.4 lb

## 2012-10-01 DIAGNOSIS — M25519 Pain in unspecified shoulder: Secondary | ICD-10-CM | POA: Insufficient documentation

## 2012-10-01 DIAGNOSIS — M7552 Bursitis of left shoulder: Secondary | ICD-10-CM

## 2012-10-01 DIAGNOSIS — M67919 Unspecified disorder of synovium and tendon, unspecified shoulder: Secondary | ICD-10-CM

## 2012-10-01 MED ORDER — ACETAMINOPHEN-CODEINE #3 300-30 MG PO TABS: 1.0000 | ORAL_TABLET | ORAL | Status: DC | PRN

## 2012-10-01 NOTE — Progress Notes (Signed)
Patient ID: Brady Frazier, male   DOB: 07-04-1941, 71 y.o.   MRN: [id number] Chief Complaint  Patient presents with  . Shoulder Pain    Left shoulder pain d/t injury a year ago referred by Dr. Ouida Sills      Mr. Grivas fell landed on his left side about one year ago now complains of sharp 7/10 intermittent left shoulder pain which is worse at night and worse when rolling onto his side. He denies catching or locking and he says nothing makes it better. He does not report weakness or loss of motion  He listed his review of systems as normal except for cold intolerance.   Medical history Past Medical History  Diagnosis Date  . Diabetes mellitus   . Hypertension   . Stroke   . Kidney disease    Physical Exam(12)  Vital signs:   GENERAL: normal development   CDV: pulses are normal   Skin: normal  Lymph: nodes were not palpable/normal  Psychiatric: awake, alert and oriented  Neuro: normal sensation Physical Exam  Musculoskeletal:       Left shoulder: He exhibits decreased range of motion, tenderness, bony tenderness and decreased strength. He exhibits no swelling, no effusion, no crepitus, no deformity, no laceration, no pain, no spasm and normal pulse.       Right elbow: Normal.      Left elbow: Normal.       Right wrist: Normal.       Left wrist: Normal.   Provocative tests for the shoulder including impingement sign which was positive. He had a negative drop arm test.  Lower extremity exam  Ambulation is normal.  The right and left lower extremity:  Inspection and palpation revealed no tenderness or abnormality in alignment in the lower extremities. Range of motion is full.  Strength is grade 5.  All joints are stable.    His x-ray did not show an abnormality.  He most likely has bursitis  We ordered an injection and therapy for him  Injection Subacromial Shoulder Injection Procedure Note  Pre-operative Diagnosis: left RC Syndrome  Post-operative  Diagnosis: same  Indications: pain   Anesthesia: ethyl chloride   Procedure Details   Verbal consent was obtained for the procedure. The shoulder was prepped withalcohol and the skin was anesthetized. A 20 gauge needle was advanced into the subacromial space through posterior approach without difficulty  The space was then injected with 3 ml 1% lidocaine and 1 ml of depomedrol. The injection site was cleansed with isopropyl alcohol and a dressing was applied.  Complications:  None; patient tolerated the procedure well.

## 2012-10-01 NOTE — Patient Instructions (Addendum)
Schedule therapy   DX: bursitis of shoulder   You have received a steroid shot. 15% of patients experience increased pain at the injection site with in the next 24 hours. This is best treated with ice and tylenol extra strength 2 tabs every 8 hours. If you are still having pain please call the office.

## 2012-10-21 ENCOUNTER — Ambulatory Visit (HOSPITAL_COMMUNITY): Payer: Medicare HMO | Admitting: Specialist

## 2012-10-28 ENCOUNTER — Ambulatory Visit (HOSPITAL_COMMUNITY)
Admission: RE | Admit: 2012-10-28 | Discharge: 2012-10-28 | Disposition: A | Discharge status: Home / Self Care | Payer: Medicare HMO | Source: Ambulatory Visit | Attending: Orthopedic Surgery | Admitting: Orthopedic Surgery

## 2012-10-28 DIAGNOSIS — IMO0001 Reserved for inherently not codable concepts without codable children: Secondary | ICD-10-CM | POA: Insufficient documentation

## 2012-10-28 DIAGNOSIS — M6281 Muscle weakness (generalized): Secondary | ICD-10-CM | POA: Insufficient documentation

## 2012-10-28 DIAGNOSIS — M25619 Stiffness of unspecified shoulder, not elsewhere classified: Secondary | ICD-10-CM | POA: Insufficient documentation

## 2012-10-28 DIAGNOSIS — M25519 Pain in unspecified shoulder: Secondary | ICD-10-CM | POA: Insufficient documentation

## 2012-10-28 NOTE — Evaluation (Signed)
Occupational Therapy Evaluation  Patient Details  Name: Brady Frazier MRN: [id number] Date of Birth: 08-01-1941  Today's Date: 10/28/2012 Time: 4098-1191 OT Time Calculation (min): 38 min OT eval 4782-9562 38'  Visit#: 1  of 16   Re-eval: 11/25/12  Assessment Diagnosis: Left shoulder bursitis  Authorization: [name] medicare - Requested 16 visits. Re-assess on 10 th visit.  Authorization Time Period:    Authorization Visit#: 1  of 16    Past Medical History:  Past Medical History  Diagnosis Date  . Diabetes mellitus   . Hypertension   . Stroke   . Kidney disease    Past Surgical History:  Past Surgical History  Procedure Date  . Stomach surgery     Subjective Symptoms/Limitations Symptoms: S: My left shoulder just hurts. Pertinent History: [name] states that approximately 3 weeks ago he began to experience pain in his left shoulder. The pain did not get any better so he received a steroid shot which he says has helped.  He has been referred to occupational therapy for evaluation and treatment. Limitations: Getting up from sitting or going down to sit, 5/10 pain level when putting on a shirt, rolling over in bed.  Patient Stated Goals: To be able to use my arm without it hurting. Pain Assessment Currently in Pain?: No/denies  Precautions/Restrictions  Precautions Precautions: None  Prior Functioning  Home Living Lives With: Spouse  Assessment ADL/Vision/Perception ADL ADL Comments: Painful when donning shirt Dominant Hand: Right Vision - History Baseline Vision: No visual deficits   Additional Assessments RUE AROM (degrees) Right Shoulder Flexion: 106 Degrees Right Shoulder ABduction: 94 Degrees Right Shoulder Internal Rotation: 51 Degrees Right Shoulder External Rotation: 30 Degrees RUE Strength Right Shoulder Flexion: 4/5 Right Shoulder ABduction: 4/5 Right Shoulder Internal Rotation: 4/5 Right Shoulder External Rotation: 4/5 Grip (lbs):  45# Lateral Pinch: 20 lbs 3 Point Pinch: 9 lbs LUE AROM (degrees) Left Shoulder Flexion: 89 Degrees Left Shoulder ABduction: 79 Degrees Left Shoulder Internal Rotation: 51 Degrees Left Shoulder External Rotation: 23 Degrees LUE PROM (degrees) Left Shoulder Flexion: 102 Degrees Left Shoulder ABduction: 98 Degrees Left Shoulder Internal Rotation: 70 Degrees Left Shoulder External Rotation: 26 Degrees LUE Strength Left Shoulder Flexion: 4/5 Left Shoulder ABduction: 3+/5 Left Shoulder Internal Rotation: 4/5 Left Shoulder External Rotation: 4/5 Grip (lbs): 45# Lateral Pinch: 15 lbs 3 Point Pinch: 6 lbs Right Hand Strength - Pinch (lbs) Lateral Pinch: 20 lbs 3 Point Pinch: 9 lbs Left Hand Strength - Pinch (lbs) Lateral Pinch: 15 lbs 3 Point Pinch: 6 lbs    Manual Therapy Manual Therapy: Myofascial release Myofascial Release: Myofascial Release: MFR and manual stretching to left upper arm, shoulder, and scapular region to decrease pain and restrictions and increase pain free mobility.  Occupational Therapy Assessment and Plan OT Assessment and Plan Clinical Impression Statement: A: Patient presents with decreased AROM and strength and increased pain and restrictions in left shoulder region causing decreased I with all B/IADLs, work, and leisure activities.  Patient will benefit from skilled OT intervention to return to prior level of function. Pt will benefit from skilled therapeutic intervention in order to improve on the following deficits: Decreased mobility;Decreased range of motion;Decreased strength;Increased fascial restricitons;Pain;Impaired UE functional use Rehab Potential: Good OT Frequency: Min 2X/week OT Duration: 8 weeks OT Treatment/Interventions: Self-care/ADL training;Therapeutic exercise;Therapeutic activities;Energy conservation;Manual therapy;Patient/family education OT Plan: P:  Skilled OT intervention to decrease pain and restrictions in his left shoulder and  increase AROM and strength needed to return to prior level of  funciton.  Treatment Plan:  MFR and manual stretching to left shoulder region.  PROM and AAROM in supine. Seated ext, ret, row, elev.  ball stretches wall wash, thumb tacks, prot.ret..elev.dep.  Progress as tolerated     Goals Short Term Goals Short Term Goal 1: Patient will be educated on HEP. Short Term Goal 2: Patient will increase PROM in left shoulder to WNL for increased abiity to reach up to groom hair. Short Term Goal 3: Patient will increase left shoulder strength to 4-/5 for increased ability to pick up groceries. Short Term Goal 4: Patient will decrease pain in his left shoulder to 3/10 when donning a shirt. Short Term Goal 5: Patient will decrease fascial restrictions to minimal-moderate in his left shoulder region. Long Term Goals Long Term Goal 1: Patient will return to prior level of I with all BIADLs and leisure activities and return to work. Long Term Goal 2: Patient will increase left shoulder AROM to [facility] for increaesd ability to reach into overhead cabinets. Long Term Goal 3: Patient will increase left shoulder strength to 4+/5 for increased ability to lift groceries Long Term Goal 4: Patient will decrease pain in his left shoulder to 1/10 when completing daily activiites. Long Term Goal 5: Patient will decrease fascial restrictions to minimal in his left shoulder region.   Problem List Patient Active Problem List  Diagnosis  . Bursitis of shoulder, left  . Shoulder pain    End of Session Activity Tolerance: Patient tolerated treatment well General Behavior During Session: [facility] for tasks performed Cognition: [facility] for tasks performed OT Plan of Care OT Home Exercise Plan: towel slides and shoulder stretches OT Patient Instructions: handout Consulted and Agree with Plan of Care: Patient   Brady Frazier, OTR/L 10/28/2012, 5:13 PM  Physician Documentation Your signature is required to indicate approval  of the treatment plan as stated above.  Please sign and either send electronically or make a copy of this report for your files and return this physician signed original.  Please mark one 1.__approve of plan  2. ___approve of plan with the following conditions.   ______________________________                                                          _____________________ Physician Signature                                                                                                             Date

## 2012-11-05 ENCOUNTER — Inpatient Hospital Stay (HOSPITAL_COMMUNITY): Admission: RE | Admit: 2012-11-05 | Payer: Medicare HMO | Source: Ambulatory Visit

## 2012-11-06 ENCOUNTER — Ambulatory Visit (HOSPITAL_COMMUNITY)
Admission: RE | Admit: 2012-11-06 | Discharge: 2012-11-06 | Disposition: A | Discharge status: Home / Self Care | Payer: Medicare HMO | Source: Ambulatory Visit | Attending: Orthopedic Surgery | Admitting: Orthopedic Surgery

## 2012-11-06 DIAGNOSIS — M6281 Muscle weakness (generalized): Secondary | ICD-10-CM | POA: Insufficient documentation

## 2012-11-06 DIAGNOSIS — M25519 Pain in unspecified shoulder: Secondary | ICD-10-CM | POA: Insufficient documentation

## 2012-11-06 NOTE — Progress Notes (Signed)
Occupational Therapy Treatment Patient Details  Name: Brady Frazier MRN: [id number] Date of Birth: November 22, 1940  Today's Date: 11/06/2012 Time: 6295-2841 OT Time Calculation (min): 43 min MFR 3244-0102 11' Therex 7253-6644 32'  Visit#: 2  of 16   Re-eval: 11/25/12    Authorization: [employer] medicare - Requested 16 visits. Re-assess on 10 th visit.  Authorization Time Period:    Authorization Visit#: 2  of 16   Subjective Symptoms/Limitations Symptoms: S: My shoulder feels pretty good today. Pain Assessment Currently in Pain?: No/denies  Precautions/Restrictions   Progress as tolerated.  Exercise/Treatments Supine Protraction: PROM;AAROM;10 reps Horizontal ABduction: PROM;AAROM;10 reps External Rotation: PROM;AAROM;10 reps Internal Rotation: PROM;AAROM;10 reps Flexion: PROM;AAROM;10 reps ABduction: PROM;AAROM;10 reps    Manual Therapy Manual Therapy: Myofascial release Myofascial Release: MFR and manual stretching to left upper arm, shoulder, and scapular region to decrease pain and restrictions and increase pain free mobility.  Occupational Therapy Assessment and Plan OT Assessment and Plan Pt will benefit from skilled therapeutic intervention in order to improve on the following deficits: Decreased mobility;Decreased range of motion;Decreased strength;Increased fascial restricitons;Pain;Impaired UE functional use Rehab Potential: Good OT Frequency: Min 2X/week OT Duration: 8 weeks OT Treatment/Interventions: Self-care/ADL training;Therapeutic exercise;Therapeutic activities;Energy conservation;Manual therapy;Patient/family education OT Plan: P: Seated ext, ret, row, elev. ball stretches wall wash, thumb tacks, prot.ret..elev.dep. Progress as tolerated    Goals Short Term Goals Short Term Goal 1: Patient will be educated on HEP. Short Term Goal 1 Progress: Progressing toward goal Short Term Goal 2: Patient will increase PROM in left shoulder to WNL for increased  abiity to reach up to groom hair. Short Term Goal 2 Progress: Progressing toward goal Short Term Goal 3: Patient will increase left shoulder strength to 4-/5 for increased ability to pick up groceries. Short Term Goal 3 Progress: Progressing toward goal Short Term Goal 4: Patient will decrease pain in his left shoulder to 3/10 when donning a shirt. Short Term Goal 4 Progress: Progressing toward goal Short Term Goal 5: Patient will decrease fascial restrictions to minimal-moderate in his left shoulder region. Long Term Goals Long Term Goal 1: Patient will return to prior level of I with all BIADLs and leisure activities and return to work. Long Term Goal 1 Progress: Progressing toward goal Long Term Goal 2: Patient will increase left shoulder AROM to [facility] for increaesd ability to reach into overhead cabinets. Long Term Goal 2 Progress: Progressing toward goal Long Term Goal 3: Patient will increase left shoulder strength to 4+/5 for increased ability to lift groceries Long Term Goal 3 Progress: Progressing toward goal Long Term Goal 4: Patient will decrease pain in his left shoulder to 1/10 when completing daily activiites. Long Term Goal 4 Progress: Progressing toward goal Long Term Goal 5: Patient will decrease fascial restrictions to minimal in his left shoulder region.  Long Term Goal 5 Progress: Progressing toward goal  Problem List Patient Active Problem List  Diagnosis  . Bursitis of shoulder, left  . Shoulder pain  . Muscle weakness (generalized)  . Pain in joint, shoulder region    End of Session Activity Tolerance: Patient tolerated treatment well General Behavior During Session: [facility] for tasks performed Cognition: [facility] for tasks performed   [name], OTR/L 11/06/2012, 12:00 PM

## 2012-11-10 ENCOUNTER — Ambulatory Visit (HOSPITAL_COMMUNITY)
Admission: RE | Admit: 2012-11-10 | Discharge: 2012-11-10 | Disposition: A | Discharge status: Home / Self Care | Payer: Medicare HMO | Source: Ambulatory Visit | Attending: Orthopedic Surgery | Admitting: Orthopedic Surgery

## 2012-11-10 NOTE — Progress Notes (Signed)
Occupational Therapy Treatment Patient Details  Name: Brady Frazier MRN: [id number] Date of Birth: August 10, 1941  Today's Date: 11/10/2012 Time: 7829-5621 OT Time Calculation (min): 42 min Massage 1028-1040 12' Therex 1040-1110 30'  Visit#: 3  of 16   Re-eval: 11/25/12    Authorization: [employer] - Requested 16 visits. Re-assess on 10 th visit.  Authorization Time Period:    Authorization Visit#: 3  of 16   Subjective Symptoms/Limitations Symptoms: S: My shoulder is sore today. I've been moving it a lot. Pain Assessment Currently in Pain?: Yes Pain Score:   5 Pain Location: Shoulder Pain Orientation: Left Pain Type: Acute pain  Precautions/Restrictions  Precautions Precautions: None  Exercise/Treatments Supine Protraction: PROM;AAROM;10 reps Horizontal ABduction: PROM;AAROM;10 reps External Rotation: PROM;AAROM;10 reps Internal Rotation: PROM;AAROM;10 reps Flexion: PROM;AAROM;10 reps ABduction: PROM;AAROM;10 reps Therapy Ball Flexion: 10 reps ABduction: 10 reps ROM / Strengthening / Isometric Strengthening Thumb Tacks: 1'      Manual Therapy Manual Therapy: Massage Massage: Massage and manual stretching to left upper arm, shoulder, and scapular region to decrease pain and restrictions and increase pain free mobility.  Occupational Therapy Assessment and Plan OT Assessment and Plan Clinical Impression Statement: A: Pain noted in left shoulder bursa; anterior shoulder region during abduction stretch. Educated patient to relax shoulder during stretches to decrease pain level. OT Plan: P: Add seated elevation, retraction, row, and extension,    Goals Short Term Goals Short Term Goal 1: Patient will be educated on HEP. Short Term Goal 1 Progress: Met Short Term Goal 2: Patient will increase PROM in left shoulder to WNL for increased abiity to reach up to groom hair. Short Term Goal 2 Progress: Progressing toward goal Short Term Goal 3: Patient will increase  left shoulder strength to 4-/5 for increased ability to pick up groceries. Short Term Goal 3 Progress: Progressing toward goal Short Term Goal 4: Patient will decrease pain in his left shoulder to 3/10 when donning a shirt. Short Term Goal 4 Progress: Progressing toward goal Short Term Goal 5: Patient will decrease fascial restrictions to minimal-moderate in his left shoulder region. Short Term Goal 5 Progress: Progressing toward goal Long Term Goals Long Term Goal 1: Patient will return to prior level of I with all BIADLs and leisure activities and return to work. Long Term Goal 1 Progress: Progressing toward goal Long Term Goal 2: Patient will increase left shoulder AROM to [facility] for increaesd ability to reach into overhead cabinets. Long Term Goal 2 Progress: Progressing toward goal Long Term Goal 3: Patient will increase left shoulder strength to 4+/5 for increased ability to lift groceries Long Term Goal 3 Progress: Progressing toward goal Long Term Goal 4: Patient will decrease pain in his left shoulder to 1/10 when completing daily activiites. Long Term Goal 4 Progress: Progressing toward goal Long Term Goal 5: Patient will decrease fascial restrictions to minimal in his left shoulder region.  Long Term Goal 5 Progress: Progressing toward goal  Problem List Patient Active Problem List  Diagnosis  . Bursitis of shoulder, left  . Shoulder pain  . Muscle weakness (generalized)  . Pain in joint, shoulder region    End of Session Activity Tolerance: Patient tolerated treatment well General Behavior During Session: [facility] for tasks performed Cognition: [facility] for tasks performed   [name], OTR/L 11/10/2012, 11:20 AM

## 2012-11-12 ENCOUNTER — Ambulatory Visit (HOSPITAL_COMMUNITY)
Admission: RE | Admit: 2012-11-12 | Discharge: 2012-11-12 | Disposition: A | Discharge status: Home / Self Care | Payer: Medicare HMO | Source: Ambulatory Visit | Attending: Orthopedic Surgery | Admitting: Orthopedic Surgery

## 2012-11-12 NOTE — Progress Notes (Signed)
Occupational Therapy Treatment Patient Details  Name: Brady Frazier MRN: [id number] Date of Birth: April 23, 1941  Today's Date: 11/12/2012 Time: 7829-5621 OT Time Calculation (min): 53 min Manual Therapy 3086-5784 18' Therapuetic Exercises 6962-9528 35' Visit#: 4  of 16   Re-eval: 11/25/12    Authorization: [name] - Requested 16 visits. Re-assess on 10 th visit.  Authorization Time Period: before 10th visit update  g code  Authorization Visit#: 4  of 10   Subjective S:  I think therapy is helping me.  My shoulder isnt quite as sore. Pain Assessment Currently in Pain?: Yes Pain Score:   4 Pain Location: Shoulder Pain Orientation: Left Pain Type: Acute pain  Precautions/Restrictions   progress as tolerated   Exercise/Treatments Supine Protraction: PROM;10 reps;AAROM;12 reps Horizontal ABduction: PROM;10 reps;AAROM;12 reps External Rotation: PROM;10 reps;AAROM;12 reps Internal Rotation: PROM;10 reps;AAROM;12 reps Flexion: PROM;10 reps;AAROM;12 reps ABduction: PROM;10 reps;AAROM;12 reps Seated Elevation: AROM;10 reps Extension: AROM;10 reps Retraction: AROM;10 reps Therapy Ball Flexion: 15 reps ABduction: 15 reps ROM / Strengthening / Isometric Strengthening Thumb Tacks: resume next visit      Manual Therapy Manual Therapy: Myofascial release Myofascial Release: MFR and manual stretching to left upper arm, shoulder, and scapular region to decrease pain and restrictions and increase pain free mobility. [id number]  Occupational Therapy Assessment and Plan OT Assessment and Plan Clinical Impression Statement: A:  Moderate fascial restrictions noted in scapular region of left shoulder.  With vg, patient able to relax body and allow for increased release release. OT Plan: P:  Add AAROM in seated as AAROM in supine improves   Goals Short Term Goals Short Term Goal 1: Patient will be educated on HEP. Short Term Goal 2: Patient will increase PROM in left shoulder  to WNL for increased abiity to reach up to groom hair. Short Term Goal 3: Patient will increase left shoulder strength to 4-/5 for increased ability to pick up groceries. Short Term Goal 4: Patient will decrease pain in his left shoulder to 3/10 when donning a shirt. Short Term Goal 5: Patient will decrease fascial restrictions to minimal-moderate in his left shoulder region. Long Term Goals Long Term Goal 1: Patient will return to prior level of I with all BIADLs and leisure activities and return to work. Long Term Goal 2: Patient will increase left shoulder AROM to [facility] for increaesd ability to reach into overhead cabinets. Long Term Goal 3: Patient will increase left shoulder strength to 4+/5 for increased ability to lift groceries Long Term Goal 4: Patient will decrease pain in his left shoulder to 1/10 when completing daily activiites. Long Term Goal 5: Patient will decrease fascial restrictions to minimal in his left shoulder region.   Problem List Patient Active Problem List  Diagnosis  . Bursitis of shoulder, left  . Shoulder pain  . Muscle weakness (generalized)  . Pain in joint, shoulder region    End of Session Activity Tolerance: Patient tolerated treatment well General Behavior During Session: [facility] for tasks performed   Shirlean Mylar, OTR/L  11/12/2012, 11:36 AM

## 2012-11-17 ENCOUNTER — Ambulatory Visit (HOSPITAL_COMMUNITY)
Admission: RE | Admit: 2012-11-17 | Discharge: 2012-11-17 | Disposition: A | Discharge status: Home / Self Care | Payer: Medicare HMO | Source: Ambulatory Visit | Attending: Orthopedic Surgery | Admitting: Orthopedic Surgery

## 2012-11-17 DIAGNOSIS — M25619 Stiffness of unspecified shoulder, not elsewhere classified: Secondary | ICD-10-CM | POA: Insufficient documentation

## 2012-11-17 DIAGNOSIS — M6281 Muscle weakness (generalized): Secondary | ICD-10-CM | POA: Insufficient documentation

## 2012-11-17 DIAGNOSIS — M25519 Pain in unspecified shoulder: Secondary | ICD-10-CM | POA: Insufficient documentation

## 2012-11-17 DIAGNOSIS — IMO0001 Reserved for inherently not codable concepts without codable children: Secondary | ICD-10-CM | POA: Insufficient documentation

## 2012-11-17 NOTE — Progress Notes (Signed)
Occupational Therapy Treatment Patient Details  Name: Brady Frazier MRN: [id number] Date of Birth: 09-24-1941  Today's Date: 11/17/2012 Time: 1191-4782 OT Time Calculation (min): 48 min Massage 9562-1308 13' Therex 6578-4696 35'  Visit#: 5  of 16   Re-eval: 11/25/12    Authorization: [employer] - Requested 16 visits. Re-assess on 10 th visit.  Authorization Time Period: before 10th visit update  g code  Authorization Visit#: 5  of 10   Subjective Symptoms/Limitations Symptoms: S: My arm is only a little sore today. Pain Assessment Currently in Pain?: Yes Pain Score:   4 Pain Location: Shoulder Pain Orientation: Left Pain Type: Acute pain  Precautions/Restrictions  Precautions Precautions: None  Exercise/Treatments Supine Protraction: PROM;10 reps;AAROM;12 reps Horizontal ABduction: PROM;10 reps;AAROM;12 reps External Rotation: PROM;10 reps;AAROM;12 reps Internal Rotation: PROM;10 reps;AAROM;12 reps Flexion: PROM;10 reps;AAROM;12 reps ABduction: PROM;10 reps;AAROM;12 reps ROM / Strengthening / Isometric Strengthening Thumb Tacks: 1'      Manual Therapy Manual Therapy: Massage Massage: Massage and manual stretching to left upper arm, shoulder, and scapular region to decrease pain and restrictions and increase pain free mobility  Occupational Therapy Assessment and Plan OT Assessment and Plan Clinical Impression Statement: A: Decreased AROM and AAROM in supine during shoulder abduction due to pain. All other supine exercises were performed without difficulty. OT Plan: P:  Add AAROM in seated as AAROM in supine improves   Goals Short Term Goals Short Term Goal 1: Patient will be educated on HEP. Short Term Goal 2: Patient will increase PROM in left shoulder to WNL for increased abiity to reach up to groom hair. Short Term Goal 2 Progress: Progressing toward goal Short Term Goal 3: Patient will increase left shoulder strength to 4-/5 for increased ability to  pick up groceries. Short Term Goal 3 Progress: Progressing toward goal Short Term Goal 4: Patient will decrease pain in his left shoulder to 3/10 when donning a shirt. Short Term Goal 4 Progress: Progressing toward goal Short Term Goal 5: Patient will decrease fascial restrictions to minimal-moderate in his left shoulder region. Short Term Goal 5 Progress: Progressing toward goal Long Term Goals Long Term Goal 1: Patient will return to prior level of I with all BIADLs and leisure activities and return to work. Long Term Goal 1 Progress: Progressing toward goal Long Term Goal 2: Patient will increase left shoulder AROM to [facility] for increaesd ability to reach into overhead cabinets. Long Term Goal 2 Progress: Progressing toward goal Long Term Goal 3: Patient will increase left shoulder strength to 4+/5 for increased ability to lift groceries Long Term Goal 3 Progress: Progressing toward goal Long Term Goal 4: Patient will decrease pain in his left shoulder to 1/10 when completing daily activiites. Long Term Goal 4 Progress: Progressing toward goal Long Term Goal 5: Patient will decrease fascial restrictions to minimal in his left shoulder region.  Long Term Goal 5 Progress: Progressing toward goal  Problem List Patient Active Problem List  Diagnosis  . Bursitis of shoulder, left  . Shoulder pain  . Muscle weakness (generalized)  . Pain in joint, shoulder region    End of Session Activity Tolerance: Patient tolerated treatment well General Behavior During Session: [facility] for tasks performed   [name], OTR/L 11/17/2012, 12:00 PM

## 2012-11-19 ENCOUNTER — Inpatient Hospital Stay (HOSPITAL_COMMUNITY): Admission: RE | Admit: 2012-11-19 | Payer: Medicare HMO | Source: Ambulatory Visit

## 2013-02-20 ENCOUNTER — Encounter (HOSPITAL_COMMUNITY): Payer: Self-pay | Admitting: *Deleted

## 2013-02-20 ENCOUNTER — Inpatient Hospital Stay (HOSPITAL_COMMUNITY)
Admission: EM | Admit: 2013-02-20 | Discharge: 2013-02-26 | DRG: 061 | Disposition: A | Discharge status: Skilled Nursing Facility | Payer: Medicare HMO | Attending: Neurology | Admitting: Neurology

## 2013-02-20 ENCOUNTER — Inpatient Hospital Stay (HOSPITAL_COMMUNITY): Payer: Medicare HMO

## 2013-02-20 ENCOUNTER — Emergency Department (HOSPITAL_COMMUNITY): Payer: Medicare HMO

## 2013-02-20 DIAGNOSIS — Z66 Do not resuscitate: Secondary | ICD-10-CM | POA: Diagnosis present

## 2013-02-20 DIAGNOSIS — Z8673 Personal history of transient ischemic attack (TIA), and cerebral infarction without residual deficits: Secondary | ICD-10-CM

## 2013-02-20 DIAGNOSIS — Q2111 Secundum atrial septal defect: Secondary | ICD-10-CM

## 2013-02-20 DIAGNOSIS — Q211 Atrial septal defect: Secondary | ICD-10-CM

## 2013-02-20 DIAGNOSIS — G8190 Hemiplegia, unspecified affecting unspecified side: Secondary | ICD-10-CM | POA: Diagnosis present

## 2013-02-20 DIAGNOSIS — I619 Nontraumatic intracerebral hemorrhage, unspecified: Secondary | ICD-10-CM | POA: Diagnosis not present

## 2013-02-20 DIAGNOSIS — I6340 Cerebral infarction due to embolism of unspecified cerebral artery: Principal | ICD-10-CM | POA: Diagnosis present

## 2013-02-20 DIAGNOSIS — R4701 Aphasia: Secondary | ICD-10-CM | POA: Diagnosis present

## 2013-02-20 DIAGNOSIS — G936 Cerebral edema: Secondary | ICD-10-CM

## 2013-02-20 DIAGNOSIS — R4789 Other speech disturbances: Secondary | ICD-10-CM | POA: Diagnosis present

## 2013-02-20 DIAGNOSIS — I63512 Cerebral infarction due to unspecified occlusion or stenosis of left middle cerebral artery: Secondary | ICD-10-CM | POA: Diagnosis present

## 2013-02-20 DIAGNOSIS — R262 Difficulty in walking, not elsewhere classified: Secondary | ICD-10-CM | POA: Diagnosis present

## 2013-02-20 DIAGNOSIS — Q2112 Patent foramen ovale: Secondary | ICD-10-CM

## 2013-02-20 DIAGNOSIS — G9341 Metabolic encephalopathy: Secondary | ICD-10-CM | POA: Diagnosis present

## 2013-02-20 DIAGNOSIS — R29810 Facial weakness: Secondary | ICD-10-CM | POA: Diagnosis present

## 2013-02-20 DIAGNOSIS — I6350 Cerebral infarction due to unspecified occlusion or stenosis of unspecified cerebral artery: Secondary | ICD-10-CM

## 2013-02-20 DIAGNOSIS — R471 Dysarthria and anarthria: Secondary | ICD-10-CM | POA: Diagnosis present

## 2013-02-20 DIAGNOSIS — E119 Type 2 diabetes mellitus without complications: Secondary | ICD-10-CM | POA: Diagnosis present

## 2013-02-20 DIAGNOSIS — I10 Essential (primary) hypertension: Secondary | ICD-10-CM | POA: Diagnosis present

## 2013-02-20 DIAGNOSIS — E785 Hyperlipidemia, unspecified: Secondary | ICD-10-CM | POA: Diagnosis present

## 2013-02-20 DIAGNOSIS — I639 Cerebral infarction, unspecified: Secondary | ICD-10-CM

## 2013-02-20 LAB — CBC
HCT: 31.4 % — ABNORMAL LOW (ref 39.0–52.0)
Hemoglobin: 11.3 g/dL — ABNORMAL LOW (ref 13.0–17.0)
MCH: 30.5 pg (ref 26.0–34.0)
MCHC: 36 g/dL (ref 30.0–36.0)
MCV: 84.9 fL (ref 78.0–100.0)

## 2013-02-20 LAB — COMPREHENSIVE METABOLIC PANEL
BUN: 19 mg/dL (ref 6–23)
CO2: 26 mEq/L (ref 19–32)
Calcium: 8.7 mg/dL (ref 8.4–10.5)
Creatinine, Ser: 2.19 mg/dL — ABNORMAL HIGH (ref 0.50–1.35)
GFR calc Af Amer: 33 mL/min — ABNORMAL LOW (ref >90)
GFR calc non Af Amer: 28 mL/min — ABNORMAL LOW (ref >90)
Glucose, Bld: 244 mg/dL — ABNORMAL HIGH (ref 70–99)
Sodium: 136 mEq/L (ref 135–145)
Total Protein: 6.9 g/dL (ref 6.0–8.3)

## 2013-02-20 LAB — TROPONIN I: Troponin I: <0.3 ng/mL (ref <0.30)

## 2013-02-20 LAB — PROTIME-INR
INR: 1.07 (ref 0.00–1.49)
Prothrombin Time: 13.8 seconds (ref 11.6–15.2)

## 2013-02-20 LAB — URINALYSIS, ROUTINE W REFLEX MICROSCOPIC
Ketones, ur: NEGATIVE mg/dL
Leukocytes, UA: NEGATIVE
Protein, ur: >300 mg/dL — AB
Urobilinogen, UA: 1 mg/dL (ref 0.0–1.0)

## 2013-02-20 MED ORDER — NICARDIPINE HCL IN NACL 20-0.86 MG/200ML-% IV SOLN
5.0000 mg/h | INTRAVENOUS | Status: DC
  Administered 2013-02-22 – 2013-02-23 (×3): 3 mg/h via INTRAVENOUS
  Filled 2013-02-20 (×4): qty 200

## 2013-02-20 MED ORDER — ACETAMINOPHEN 650 MG RE SUPP
650.0000 mg | RECTAL | Status: DC | PRN
  Administered 2013-02-21 – 2013-02-23 (×4): 650 mg via RECTAL
  Filled 2013-02-20 (×4): qty 1

## 2013-02-20 MED ORDER — DEXTROSE 5 % IV SOLN
5.0000 mg/h | INTRAVENOUS | Status: DC
  Filled 2013-02-20 (×3): qty 10

## 2013-02-20 MED ORDER — PANTOPRAZOLE SODIUM 40 MG IV SOLR
40.0000 mg | Freq: Every day | INTRAVENOUS | Status: DC
  Administered 2013-02-20 – 2013-02-22 (×3): 40 mg via INTRAVENOUS
  Filled 2013-02-20 (×4): qty 40

## 2013-02-20 MED ORDER — SODIUM CHLORIDE 0.9 % IV SOLN
INTRAVENOUS | Status: DC
  Administered 2013-02-21 – 2013-02-24 (×6): via INTRAVENOUS

## 2013-02-20 MED ORDER — ONDANSETRON HCL 4 MG/2ML IJ SOLN
4.0000 mg | Freq: Four times a day (QID) | INTRAMUSCULAR | Status: DC | PRN
  Administered 2013-02-20 – 2013-02-21 (×2): 4 mg via INTRAVENOUS
  Filled 2013-02-20 (×2): qty 2

## 2013-02-20 MED ORDER — HYDRALAZINE HCL 20 MG/ML IJ SOLN
5.0000 mg | Freq: Once | INTRAMUSCULAR | Status: AC
  Administered 2013-02-20: 5 mg via INTRAVENOUS

## 2013-02-20 MED ORDER — ALTEPLASE 100 MG IV SOLR
INTRAVENOUS | Status: AC
  Administered 2013-02-20: 70 mg via INTRAVENOUS
  Filled 2013-02-20: qty 1

## 2013-02-20 MED ORDER — HYDRALAZINE HCL 20 MG/ML IJ SOLN
2.0000 mg | INTRAMUSCULAR | Status: DC | PRN
  Administered 2013-02-20 – 2013-02-21 (×2): 2 mg via INTRAVENOUS
  Filled 2013-02-20 (×3): qty 1

## 2013-02-20 MED ORDER — INSULIN ASPART 100 UNIT/ML ~~LOC~~ SOLN
0.0000 [IU] | Freq: Three times a day (TID) | SUBCUTANEOUS | Status: DC
  Administered 2013-02-20: 2 [IU] via SUBCUTANEOUS
  Administered 2013-02-21: 5 [IU] via SUBCUTANEOUS

## 2013-02-20 MED ORDER — SENNOSIDES-DOCUSATE SODIUM 8.6-50 MG PO TABS
1.0000 | ORAL_TABLET | Freq: Every evening | ORAL | Status: DC | PRN
  Filled 2013-02-20: qty 1

## 2013-02-20 MED ORDER — ALTEPLASE (STROKE) FULL DOSE INFUSION
77.8000 mg | Freq: Once | INTRAVENOUS | Status: AC
  Filled 2013-02-20: qty 78

## 2013-02-20 MED ORDER — SODIUM CHLORIDE 0.9 % IV SOLN: INTRAVENOUS | Status: DC

## 2013-02-20 MED ORDER — ACETAMINOPHEN 325 MG PO TABS
650.0000 mg | ORAL_TABLET | ORAL | Status: DC | PRN
  Filled 2013-02-20: qty 2

## 2013-02-20 MED ORDER — NICARDIPINE HCL IN NACL 20-0.86 MG/200ML-% IV SOLN: 5.0000 mg/h | INTRAVENOUS | Status: DC

## 2013-02-20 NOTE — H&P (Addendum)
Admission H&P    Chief Complaint: Drooling, slurred speech and difficulty walking  HPI: Brady Frazier is an 72 y.o. male who was well this morning per family.  Patient was watching television with his wife when she noted that he was drooling.  Wife then began to ask questions of the patient and his answers were not accurate, speech was slurred.  Patient is at baseline ambulatory and walking with a limp but was unable to ambulate at this time as well.  Patient was brought to the ED at that time for further evaluation where he was evaluated by Teleneurology services.  tPA was administered and patient is being transferred here for further care. Patient's pre-tPA NIHSS was 7. Current score is 5.    Date last known well: 02/20/2013 Time last known well: 1045 tPA Given: Yes  Past Medical History  Diagnosis Date  . Diabetes mellitus   . Hypertension   . Stroke   . Kidney disease     Past Surgical History  Procedure Laterality Date  . Stomach surgery      Family history: Family unavailable at this time.  Patient unable to provide.     Social History:  reports that he has never smoked. He does not have any smokeless tobacco history on file. He reports that he does not drink alcohol or use illicit drugs.  Allergies: No Known Allergies  Medications Prior to Admission  Medication Sig Dispense Refill  . amLODipine (NORVASC) 5 MG tablet Take 5 mg by mouth daily.      Marland Kitchen atorvastatin (LIPITOR) 20 MG tablet Take 20 mg by mouth daily.      . insulin glargine (LANTUS) 100 UNIT/ML injection Inject 25 Units into the skin at bedtime.         ROS: History obtained from the patient  General ROS: negative for - chills, fatigue, fever, night sweats, weight gain or weight loss Psychological ROS: negative for - behavioral disorder, hallucinations, memory difficulties, mood swings or suicidal ideation Ophthalmic ROS: negative for - blurry vision, double vision, eye pain or loss of vision ENT ROS:  negative for - epistaxis, nasal discharge, oral lesions, sore throat, tinnitus or vertigo Allergy and Immunology ROS: negative for - hives or itchy/watery eyes Hematological and Lymphatic ROS: negative for - bleeding problems, bruising or swollen lymph nodes Endocrine ROS: negative for - galactorrhea, hair pattern changes, polydipsia/polyuria or temperature intolerance Respiratory ROS: negative for - cough, hemoptysis, shortness of breath or wheezing Cardiovascular ROS: negative for - chest pain, dyspnea on exertion, edema or irregular heartbeat Gastrointestinal ROS: negative for - abdominal pain, diarrhea, hematemesis, nausea/vomiting or stool incontinence Genito-Urinary ROS: negative for - dysuria, hematuria, incontinence or urinary frequency/urgency Musculoskeletal ROS: negative for - joint swelling or muscular weakness Neurological ROS: as noted in HPI, headache (left sided) Dermatological ROS: negative for rash and skin lesion changes  Physical Examination: Blood pressure 192/73, pulse 62, temperature 98.2 F (36.8 C), temperature source Oral, resp. rate 15, height 5\' 8"  (1.727 m), weight 83.3 kg (183 lb 10.3 oz), SpO2 100.00%.  General Examination: HEENT-  Normocephalic, no lesions, without obvious abnormality.  Normal external eye and conjunctiva.  Normal TM's bilaterally.  Normal auditory canals and external ears. Normal external nose, mucus membranes and septum.  Normal pharynx. Neck supple with no masses, nodes, nodules or enlargement. Cardiovascular - S1, S2 normal Lungs - chest clear, no wheezing, rales, normal symmetric air entry Abdomen - soft, non-tender; bowel sounds normal; no masses,  no organomegaly Extremities -  no edema and no skin discoloration  Neurologic Examination: Mental Status: Alert.  Oriented to name and place but thought it was April of 1973.  Speech fluent without evidence of aphasia but mildly dysarthric.  Able to follow 3 step commands without difficulty.   Left neglect. Cranial Nerves: II: Discs flat bilaterally; Visual fields grossly normal, pupils equal, round, reactive to light and accommodation III,IV, VI: ptosis not present, extra-ocular motions intact bilaterally V,VII: right facial droop, facial light touch sensation normal bilaterally VIII: hearing normal bilaterally IX,X: gag reflex present XI: bilateral shoulder shrug XII: midline tongue extension Motor: Right : Upper extremity   5/5    Left:     Upper extremity   4/5  Lower extremity   5/5     Lower extremity   5/5 Tone and bulk:normal tone throughout; no atrophy noted Sensory: Pinprick and light touch intact throughout, bilaterally Deep Tendon Reflexes: 2+ in the upper extremities, 1+ at the knees and absent at the ankles Plantars: Right: mute   Left: mute Cerebellar: normal finger-to-nose and normal heel-to-shin test Gait: Unable to test CV: pulses palpable throughout   Laboratory Studies:   Basic Metabolic Panel:  Recent Labs Lab 02/20/13 1154  NA 136  K 4.8  CL 103  CO2 26  GLUCOSE 244*  BUN 19  CREATININE 2.19*  CALCIUM 8.7    Liver Function Tests:  Recent Labs Lab 02/20/13 1154  AST 17  ALT 14  ALKPHOS 71  BILITOT 0.4  PROT 6.9  ALBUMIN 3.0*   No results found for this basename: LIPASE, AMYLASE,  in the last 168 hours No results found for this basename: AMMONIA,  in the last 168 hours  CBC:  Recent Labs Lab 02/20/13 1154  WBC 7.2  HGB 11.3*  HCT 31.4*  MCV 84.9  PLT 214    Cardiac Enzymes:  Recent Labs Lab 02/20/13 1154  TROPONINI <0.30    BNP: No components found with this basename: POCBNP,   CBG: No results found for this basename: GLUCAP,  in the last 168 hours  Microbiology: No results found for this or any previous visit.  Coagulation Studies:  Recent Labs  02/20/13 1154  LABPROT 13.8  INR 1.07    Urinalysis: No results found for this basename: COLORURINE, APPERANCEUR, LABSPEC, PHURINE, GLUCOSEU, HGBUR,  BILIRUBINUR, KETONESUR, PROTEINUR, UROBILINOGEN, NITRITE, LEUKOCYTESUR,  in the last 168 hours  Lipid Panel:     Component Value Date/Time   CHOL  Value: 121        ATP III CLASSIFICATION:  <200     mg/dL   Desirable  366-440  mg/dL   Borderline High  >=347    mg/dL   High        02/05/9562 0921   TRIG 80 07/05/2010 0921   HDL 40 07/05/2010 0921   CHOLHDL 3.0 07/05/2010 0921   VLDL 16 07/05/2010 0921   LDLCALC  Value: 65        Total Cholesterol/HDL:CHD Risk Coronary Heart Disease Risk Table                     Men   Women  1/2 Average Risk   3.4   3.3  Average Risk       5.0   4.4  2 X Average Risk   9.6   7.1  3 X Average Risk  23.4   11.0        Use the calculated Patient Ratio above and the CHD  Risk Table to determine the patient's CHD Risk.        ATP III CLASSIFICATION (LDL):  <100     mg/dL   Optimal  409-811  mg/dL   Near or Above                    Optimal  130-159  mg/dL   Borderline  914-782  mg/dL   High  >956     mg/dL   Very High 11/27/863 7846    HgbA1C:  No results found for this basename: HGBA1C    Urine Drug Screen:   No results found for this basename: labopia, cocainscrnur, labbenz, amphetmu, thcu, labbarb    Alcohol Level: No results found for this basename: ETH,  in the last 168 hours  Other results: EKG: sinus rhythm at 64 bpm with 1st degree AV block  Imaging: Ct Head Wo Contrast  02/20/2013  *RADIOLOGY REPORT*  Clinical Data: Acute onset of slurred speech and drooling. Weakness.  CT HEAD WITHOUT CONTRAST  Technique:  Contiguous axial images were obtained from the base of the skull through the vertex without contrast.  Comparison: MRI brain 07/04/2010.  Findings: The previous left superior cerebellar infarct is evident. Smaller remote infarcts of the cerebellum are stable as well. Remote posterior parietal and occipital encephalomalacia bilaterally is stable.  No acute cortical infarct, hemorrhage, mass lesion is evident.  The ventricles are proportionate to the degree  of atrophy.  There is ex vacuo dilation associated with the previous infarcts.  No significant extra-axial fluid collection is present.  Partial opacification of anterior right ethmoid air cells is evident.  The remaining paranasal sinuses and the mastoid air cells are clear.  The osseous skull is intact.  Atherosclerotic calcifications are present in the cavernous carotid arteries bilaterally.  t.  IMPRESSION:  1.  Remote left superior cerebellar infarct. 2.  Stable additional bilateral remote infarcts of the cerebellum, posterior parietal and occipital lobes bilaterally.  Ischemic changes are predominately in the posterior circulation. 3.  Stable atrophy and white matter disease. 4.  Stable ventricular dilation, proportionate to the atrophy and with ex vacuo dilation associated with prior infarcts.  These results were called by telephone on 02/20/2013 at 01:18 p.m. to Dr. Devoria Albe, who verbally acknowledged these results.   Original Report Authenticated By: Marin Roberts, M.D.     Assessment: 72 y.o. male presenting with slurred speech, drooling, and difficulty with gait.  Head CT was reviewed and shows no acute changes.  Patient was administered tPA.  NIHSS at that time was 7.  Current NIHSS after tPA administration and transfer is 5.    Stroke Risk Factors - diabetes mellitus, hypertension and stroke in the past  Plan: 1. HgbA1c, fasting lipid panel 2. MRI, MRA  of the brain without contrast 3. PT consult, OT consult, Speech consult 4. Echocardiogram 5. Carotid dopplers 6. Prophylactic therapy-None 7. BP control 8. Telemetry monitoring 9. Frequent neuro checks 10. Repeat head CT in 24 hours   Thana Farr, MD [facility] [phone] 02/20/2013, 3:37 PM

## 2013-02-20 NOTE — ED Notes (Signed)
MD at bedside. 

## 2013-02-20 NOTE — Progress Notes (Signed)
Pt becoming more lethargic with expressive aphasia, new NIH completed with score of 7. Pt remains nauseated. Dr Thad Ranger paged, orders for STAT head CT given. Pt taken for CT. While on table pt vomited. No new neuro changes. Dr Thad Ranger updated. Will continue to monitor.

## 2013-02-20 NOTE — ED Notes (Signed)
Report given to [facility] 

## 2013-02-20 NOTE — Progress Notes (Addendum)
Upon entering pt room to assit with dinner tray, he became nauseated and vomited small amount of green bile. NIH remains at 4. Zofran 4mg  IVP given. Dr Thad Ranger notified of episode. She stated to continue to monitor for any changes in neuro function. No new orders given. Will continue to monitor.

## 2013-02-20 NOTE — ED Notes (Signed)
Pt unable to state place, month or year

## 2013-02-20 NOTE — ED Provider Notes (Signed)
History     CSN: [id number]  Arrival date & time      First MD Initiated Contact with Patient 02/20/13 1144      Chief Complaint  Patient presents with  . Altered Mental Status    (Consider location/radiation/quality/duration/timing/severity/associated sxs/prior treatment) The history is provided by the spouse and medical records. No language interpreter was used.   HPI Comments: Brady Frazier is a 72 y.o. male brought in by ambulance, with a h/o of a CVA with left-sided weakness in 2010 who presents to the Emergency Department with sudden onset AMS that began 1100 when he was sitting in front the television and began to drool. He wife reports that she noticed the drooling and told him to wipe it away, but he did not wipe near his mouth. His wife reports that she noticed he began drooling again a few minutes and became concerned so she started asking him questions about his family, but he was slurring his words and unable to recall his brother and some of his grandchildren. She states that he was last seen normal at 1045 when he was able to walk to the bathroom then into the living room and had just finished eating breakfast. She reports that he was unable to walk after she noticed the drooling. When she asked him to get up he said he couldn't She denies LOC.   In ED, he denies having a HA or having any current pain. His wife states that he has been complaining of being tired all week. She states that he is currently taking Plavix and that he takes all medications as prescribed, although she has to remind him. She reports after his previous CVA that his left arm weakness resolved, but that he is still ambulatory with a limp, but does not require the use of a cane. He is a nonsmoker.  PCP is Dr. Ouida Sills. No neurologist.  Past Medical History  Diagnosis Date  . Diabetes mellitus   . Hypertension   . Stroke   . Kidney disease     Past Surgical History  Procedure Laterality Date  .  Stomach surgery      History reviewed. No pertinent family history.  History  Substance Use Topics  . Smoking status: Never Smoker   . Smokeless tobacco: Not on file  . Alcohol Use: No  lives at home Lives with spouse   Review of Systems  Neurological: Negative for headaches.       Altered Mental Status  All other systems reviewed and are negative.    Allergies  Review of patient's allergies indicates no known allergies.  Home Medications   Current Outpatient Rx  Name  Route  Sig  Dispense  Refill  . amLODipine (NORVASC) 5 MG tablet   Oral   Take 5 mg by mouth daily.         Marland Kitchen atorvastatin (LIPITOR) 20 MG tablet   Oral   Take 20 mg by mouth daily.         . insulin glargine (LANTUS) 100 UNIT/ML injection   Subcutaneous   Inject 25 Units into the skin at bedtime.            BP 182/78  Pulse 65  Temp(Src) 98.4 F (36.9 C) (Oral)  Resp 17  SpO2 100%  Vital signs normal except hypertension   Physical Exam  Nursing note and vitals reviewed. Constitutional: He appears well-developed and well-nourished.  Non-toxic appearance. He does not appear ill. No distress.  HENT:  Head: Normocephalic and atraumatic.  Right Ear: External ear normal.  Left Ear: External ear normal.  Nose: Nose normal. No mucosal edema or rhinorrhea.  Mouth/Throat: Oropharynx is clear and moist and mucous membranes are normal. No dental abscesses or edematous.  Eyes: Conjunctivae and EOM are normal. Pupils are equal, round, and reactive to light.  Neck: Normal range of motion and full passive range of motion without pain. Neck supple.  Cardiovascular: Normal rate, regular rhythm and normal heart sounds.  Exam reveals no gallop and no friction rub.   No murmur heard. Pulmonary/Chest: Effort normal and breath sounds normal. No respiratory distress. He has no wheezes. He has no rhonchi. He has no rales. He exhibits no tenderness and no crepitus.  Abdominal: Soft. Normal appearance and  bowel sounds are normal. He exhibits no distension. There is no tenderness. There is no rebound and no guarding.  Musculoskeletal: Normal range of motion. He exhibits no edema and no tenderness.  Neurological:  Speech is slow and thick, he mainly states "I can't do it". Unable to move his tongue. Can not cooperate for any of the facial nerve tests. Started drooling during exam. Nursing staff was assembling suction for patient. Difficulty with gripping on the left, unable to flex fingers. Grip on the right was very weak. Able to hold both arms against gravity without drift. Could lift both legs off the stretcher, but only about 1 inch and seemed difficult in both legs.   Skin: Skin is warm, dry and intact. No rash noted. No erythema. No pallor.  Psychiatric: He has a normal mood and affect. His speech is normal and behavior is normal. His mood appears not anxious.    ED Course  Procedures (including critical care time)  Medications  alteplase (ACTIVASE) 1 mg/mL infusion 77.8 mg (70 mg Intravenous New Bag/Given 02/20/13 1342)     DIAGNOSTIC STUDIES: Oxygen Saturation is 100% on room air, normal by my interpretation.    COORDINATION OF CARE:  12:00- Discussed planned course of treatment with the patient, including a head CT without contrast, Troponin, blood work, UA, and EKG, who is agreeable at this time.  12:38- Dr. Elta Guadeloupe from tele-neurology discussed reason for consult.  13:13 Dr Sharf,teleneurologistm, feels pt needs to get activase, to get 77.8 mg total,  7.8 mg bolus then 70 mg as drip.  13:15 CODE STROKE called  13:18 Dr Alfredo Batty called back CT results.   13:24 Dr Thad Ranger, Neurology [facility] accepts in transfer to Neuro ICU   13:51 Dr Cleophus Molt, called back, states his BP is improving and he got his tPA within the 3 hour window  14:00 pt now talking, he is able to stick out his tongue, he can flex the fingers in his left hand.    Results for orders placed during the hospital encounter  of 02/20/13  CBC      Result Value Range   WBC 7.2  4.0 - 10.5 K/uL   RBC 3.70 (*) 4.22 - 5.81 MIL/uL   Hemoglobin 11.3 (*) 13.0 - 17.0 g/dL   HCT 16.1 (*) 09.6 - 04.5 %   MCV 84.9  78.0 - 100.0 fL   MCH 30.5  26.0 - 34.0 pg   MCHC 36.0  30.0 - 36.0 g/dL   RDW 40.9  81.1 - 91.4 %   Platelets 214  150 - 400 K/uL  COMPREHENSIVE METABOLIC PANEL      Result Value Range   Sodium 136  135 - 145 mEq/L  Potassium 4.8  3.5 - 5.1 mEq/L   Chloride 103  96 - 112 mEq/L   CO2 26  19 - 32 mEq/L   Glucose, Bld 244 (*) 70 - 99 mg/dL   BUN 19  6 - 23 mg/dL   Creatinine, Ser 1.47 (*) 0.50 - 1.35 mg/dL   Calcium 8.7  8.4 - 82.9 mg/dL   Total Protein 6.9  6.0 - 8.3 g/dL   Albumin 3.0 (*) 3.5 - 5.2 g/dL   AST 17  0 - 37 U/L   ALT 14  0 - 53 U/L   Alkaline Phosphatase 71  39 - 117 U/L   Total Bilirubin 0.4  0.3 - 1.2 mg/dL   GFR calc non Af Amer 28 (*) >90 mL/min   GFR calc Af Amer 33 (*) >90 mL/min  PROTIME-INR      Result Value Range   Prothrombin Time 13.8  11.6 - 15.2 seconds   INR 1.07  0.00 - 1.49  TROPONIN I      Result Value Range   Troponin I <0.30  <0.30 ng/mL   Laboratory interpretation all normal renal insuffic, mild anemia, hyperglycemia  Ct Head Wo Contrast  02/20/2013  *RADIOLOGY REPORT*  Clinical Data: Acute onset of slurred speech and drooling. Weakness.  CT HEAD WITHOUT CONTRAST  Technique:  Contiguous axial images were obtained from the base of the skull through the vertex without contrast.  Comparison: MRI brain 07/04/2010.  Findings: The previous left superior cerebellar infarct is evident. Smaller remote infarcts of the cerebellum are stable as well. Remote posterior parietal and occipital encephalomalacia bilaterally is stable.  No acute cortical infarct, hemorrhage, mass lesion is evident.  The ventricles are proportionate to the degree of atrophy.  There is ex vacuo dilation associated with the previous infarcts.  No significant extra-axial fluid collection is present.   Partial opacification of anterior right ethmoid air cells is evident.  The remaining paranasal sinuses and the mastoid air cells are clear.  The osseous skull is intact.  Atherosclerotic calcifications are present in the cavernous carotid arteries bilaterally.  t.  IMPRESSION:  1.  Remote left superior cerebellar infarct. 2.  Stable additional bilateral remote infarcts of the cerebellum, posterior parietal and occipital lobes bilaterally.  Ischemic changes are predominately in the posterior circulation. 3.  Stable atrophy and white matter disease. 4.  Stable ventricular dilation, proportionate to the atrophy and with ex vacuo dilation associated with prior infarcts.  These results were called by telephone on 02/20/2013 at 01:18 p.m. to Dr. Devoria Albe, who verbally acknowledged these results.   Original Report Authenticated By: Marin Roberts, M.D.      Date: 02/20/2013  Rate: 64  Rhythm: normal sinus rhythm  QRS Axis: normal  Intervals: PR prolonged  ST/T Wave abnormalities: normal  Conduction Disutrbances:LVH  Narrative Interpretation:   Old EKG Reviewed: unchanged from 07/04/2010    1. Acute ischemic stroke     Plan transfer to [facility] for admission to stroke service   Devoria Albe, MD, FACEP  CRITICAL CARE Performed by: Devoria Albe L Total critical care time: 52 min Critical care time was exclusive of separately billable procedures and treating other patients. Critical care was necessary to treat or prevent imminent or life-threatening deterioration. Critical care was time spent personally by me on the following activities: development of treatment plan with patient and/or surrogate as well as nursing, discussions with consultants, evaluation of patient's response to treatment, examination of patient, obtaining history from patient or surrogate, ordering  and performing treatments and interventions, ordering and review of laboratory studies, ordering and review of radiographic studies, pulse  oximetry and re-evaluation of patient's condition.    MDM   I personally performed the services described in this documentation, which was scribed in my presence. The recorded information has been reviewed and considered.       Ward Givens, MD 02/20/13 [phone]

## 2013-02-20 NOTE — ED Notes (Signed)
[facility] reports wife noticed pt with drooling while watching TV today at 1100, hx of CVA's with left sided weakness, denies any pain, [facility] CBG was 208

## 2013-02-21 ENCOUNTER — Inpatient Hospital Stay (HOSPITAL_COMMUNITY): Payer: Medicare HMO

## 2013-02-21 DIAGNOSIS — G936 Cerebral edema: Secondary | ICD-10-CM

## 2013-02-21 DIAGNOSIS — I619 Nontraumatic intracerebral hemorrhage, unspecified: Secondary | ICD-10-CM

## 2013-02-21 LAB — HEMOGLOBIN A1C
Hgb A1c MFr Bld: 5.7 % — ABNORMAL HIGH (ref <5.7)
Mean Plasma Glucose: 117 mg/dL — ABNORMAL HIGH (ref <117)

## 2013-02-21 LAB — LIPID PANEL
HDL: 62 mg/dL (ref >39)
Triglycerides: 56 mg/dL (ref <150)

## 2013-02-21 LAB — GLUCOSE, CAPILLARY
Glucose-Capillary: 228 mg/dL — ABNORMAL HIGH (ref 70–99)
Glucose-Capillary: 168 mg/dL — ABNORMAL HIGH (ref 70–99)

## 2013-02-21 MED ORDER — LABETALOL HCL 5 MG/ML IV SOLN
20.0000 mg | INTRAVENOUS | Status: DC | PRN
  Administered 2013-02-21 – 2013-02-26 (×9): 20 mg via INTRAVENOUS
  Filled 2013-02-21 (×9): qty 4

## 2013-02-21 MED ORDER — BIOTENE DRY MOUTH MT LIQD
15.0000 mL | Freq: Two times a day (BID) | OROMUCOSAL | Status: DC
  Administered 2013-02-22 – 2013-02-23 (×4): 15 mL via OROMUCOSAL

## 2013-02-21 MED ORDER — INSULIN ASPART 100 UNIT/ML ~~LOC~~ SOLN: 0.0000 [IU] | SUBCUTANEOUS | Status: DC

## 2013-02-21 MED ORDER — BIOTENE DRY MOUTH MT LIQD
15.0000 mL | Freq: Two times a day (BID) | OROMUCOSAL | Status: DC
  Administered 2013-02-21 (×2): 15 mL via OROMUCOSAL

## 2013-02-21 MED ORDER — HYDRALAZINE HCL 20 MG/ML IJ SOLN
20.0000 mg | Freq: Three times a day (TID) | INTRAMUSCULAR | Status: DC | PRN
  Administered 2013-02-21 – 2013-02-26 (×6): 20 mg via INTRAVENOUS
  Filled 2013-02-21 (×5): qty 1

## 2013-02-21 MED ORDER — HYDRALAZINE HCL 20 MG/ML IJ SOLN
INTRAMUSCULAR | Status: AC
  Administered 2013-02-21: 10:00:00
  Filled 2013-02-21: qty 1

## 2013-02-21 MED ORDER — INSULIN ASPART 100 UNIT/ML ~~LOC~~ SOLN
0.0000 [IU] | SUBCUTANEOUS | Status: DC
  Administered 2013-02-21 (×3): 3 [IU] via SUBCUTANEOUS
  Administered 2013-02-22 (×2): 2 [IU] via SUBCUTANEOUS
  Administered 2013-02-22: 3 [IU] via SUBCUTANEOUS
  Administered 2013-02-22 – 2013-02-23 (×5): 2 [IU] via SUBCUTANEOUS

## 2013-02-21 MED ORDER — CHLORHEXIDINE GLUCONATE 0.12 % MT SOLN
15.0000 mL | Freq: Two times a day (BID) | OROMUCOSAL | Status: DC
  Administered 2013-02-21 – 2013-02-23 (×3): 15 mL via OROMUCOSAL
  Filled 2013-02-21 (×4): qty 15

## 2013-02-21 NOTE — Progress Notes (Addendum)
Stroke Team Progress Note  HISTORY Colten Skeet is a 72 y.o. male who was well on the morning of 02/20/13 per family. Patient was watching television with his wife when she noted that he was drooling. Wife then began to ask questions of the patient and his answers were not accurate, speech was slurred. Patient is at baseline ambulatory and walking with a limp but was unable to ambulate at this time as well. Patient was brought to the ED at that time for further evaluation where he was evaluated by Teleneurology services. tPA was administered and patient was transferred to [facility] for further care. Patient's pre-tPA NIHSS was 7. Upon arrival at [facility]: Lambert Rhonda W NIH score was 5.  The patient later developed an intracranial hemorrhage after receiving the TPA. Yesterday PM and seems to have declined even further thru the night  Date last known well: 02/20/2013  Time last known well: 1045  tPA Given: Yes  SUBJECTIVE  There are no family members present this morning. The patient is poorly responsive. Minimal verbal communication.BP has been controlled overnight per TPA protocol  OBJECTIVE Most recent Vital Signs: Filed Vitals:   02/21/13 0600 02/21/13 0630 02/21/13 0700 02/21/13 0730  BP: 169/59 170/68 166/68 175/64  Pulse: 94 96 101 101  Temp:      TempSrc:      Resp: 24 24 21 24   Height:      Weight:      SpO2: 97%  97% 97%   CBG (last 3)   Recent Labs  02/20/13 1705 02/20/13 2216  GLUCAP 132* 160*    IV Fluid Intake:   . sodium chloride 100 mL/hr at 02/20/13 1515  . sodium chloride 75 mL/hr at 02/21/13 0700  . niCARDipine      MEDICATIONS  . antiseptic oral rinse  15 mL Mouth Rinse BID  . insulin aspart  0-15 Units Subcutaneous TID WC  . pantoprazole (PROTONIX) IV  40 mg Intravenous QHS   PRN:  acetaminophen, acetaminophen, hydrALAZINE, ondansetron (ZOFRAN) IV, senna-docusate  Diet:  Carb Control ? thin liquids Activity:  Bedrest  DVT Prophylaxis:  SCDs  CLINICALLY SIGNIFICANT  STUDIES Basic Metabolic Panel:  Recent Labs Lab 02/20/13 1154  NA 136  K 4.8  CL 103  CO2 26  GLUCOSE 244*  BUN 19  CREATININE 2.19*  CALCIUM 8.7   Liver Function Tests:  Recent Labs Lab 02/20/13 1154  AST 17  ALT 14  ALKPHOS 71  BILITOT 0.4  PROT 6.9  ALBUMIN 3.0*   CBC:  Recent Labs Lab 02/20/13 1154  WBC 7.2  HGB 11.3*  HCT 31.4*  MCV 84.9  PLT 214   Coagulation:  Recent Labs Lab 02/20/13 1154  LABPROT 13.8  INR 1.07   Cardiac Enzymes:  Recent Labs Lab 02/20/13 1154  TROPONINI <0.30   Urinalysis:  Recent Labs Lab 02/20/13 1506  COLORURINE YELLOW  LABSPEC 1.011  PHURINE 6.5  GLUCOSEU 100*  HGBUR TRACE*  BILIRUBINUR NEGATIVE  KETONESUR NEGATIVE  PROTEINUR >300*  UROBILINOGEN 1.0  NITRITE NEGATIVE  LEUKOCYTESUR NEGATIVE   Lipid Panel    Component Value Date/Time   CHOL 152 02/21/2013 0330   TRIG 56 02/21/2013 0330   HDL 62 02/21/2013 0330   CHOLHDL 2.5 02/21/2013 0330   VLDL 11 02/21/2013 0330   LDLCALC 79 02/21/2013 0330   HgbA1C  No results found for this basename: HGBA1C    Urine Drug Screen:   No results found for this basename: labopia, cocainscrnur, labbenz, amphetmu, thcu, labbarb  Alcohol Level: No results found for this basename: ETH,  in the last 168 hours  Ct Head Wo Contrast  02/20/2013 18:37 1.  New left parietal parenchymal hemorrhage with some extension into the subarachnoid space in the posterior left sylvian fissure and tentorium. 2.  Stable remote infarcts.  Ct Head Wo Contrast  02/20/2013 12:16 1.  Remote left superior cerebellar infarct. 2.  Stable additional bilateral remote infarcts of the cerebellum, posterior parietal and occipital lobes bilaterally.  Ischemic changes are predominately in the posterior circulation. 3.  Stable atrophy and white matter disease. 4.  Stable ventricular dilation, proportionate to the atrophy and with ex vacuo dilation associated with prior infarcts.      Dg Chest Port 1  View  02/20/2013   1.  Low lung volumes and minimal bibasilar atelectasis.  2.  No acute cardiopulmonary disease.      MRI of the brain  Pending  MRA of the brain  - Pending  2D Echocardiogram  - Pending  Carotid Doppler  - Pending  EKG  SR rate 64 BPM  Therapy Recommendations - Pending  Physical Exam   Elderly african american male  Not in distress.  Afebrile. Head is nontraumatic. Neck is supple without bruit.  l. Cardiac exam no murmur or gallop. Lungs are clear to auscultation. Distal pulses are well felt.  NEUROLOGIC:   MENTAL STATUS: Drowsy barely arouses. Speech nonfluent and few words only,  follows simple commands.  CRANIAL NERVES: pupils equal and reactive to light, left gaze preference. Able to look to right upto midline only. Decrease blink to threat on right side.,right lower face weakness. uvula midlinec, tongue midline MOTOR: normal bulk and tone, Strength  Mildly diminished on right but has antigravity movements  Left more than right- SENSORY: normal and symmetric to light touch  COORDINATION: finger-nose-finger and  - heel to shin unable to test REFLEXES: deep tendon reflexes present and symmetric - no babinski    ASSESSMENT Mr. Olumide Dolinger is a 72 y.o. male presenting with dysarthria and LUE paresis.. Status post IV t-PA  at 13:15 on 02/20/13.. Imaging later confirmed a new left parietal parenchymal hemorrhage with some extension into the subarachnoid space and stable remote infarcts. Infarcts felt to be embolic secondary to unknown etiology.  On no anticoagulation prior to admission. Now on no anticoagulation secondary to TPA and hemorrhage. Patient with resultant lethargy, left upper extremity paresis, and right neglect. Work up underway.   DM  Htn  Renal Disease  Previous strokes  Anemia.  Hospital day # 1  TREATMENT/PLAN  Continue to hold anticoagulation.  Await MRI/MRA, HBA1C, Echo, Dopplers  Therapy evaluations  Risk factor  modification.  Strict Control hypertension. And change BP goal to below 160.  Repeat head Ct now  No family available at bedside  Keep NPO till evaluated by speech therapy  Hassel Neth [facility] Pager [phone] 02/21/2013, 10:26 AM I have personally obtained a history, examined the patient, evaluated imaging results, and formulated the assessment and plan of care. I agree with the above. This patient is critically ill and at significant risk of neurological worsening, death and care requires constant monitoring of vital signs, hemodynamics,respiratory and cardiac monitoring,review of multiple databases, neurological assessment, discussion with family, other specialists and medical decision making of high complexity. I spent 35 minutes of neurocritical care time  in the care of  this patient. Delia Heady, MD  ADDENDUM : I reviewed personally f/u CT head which now shows slight  enlargement of the left parietal hematoma along with intraventricular blood and some sub-arachnoid blood in the right pareital region. I discussed the results with patient's family member who arrived and answered questions. His prognosis is guarded given prior multiple strokes.

## 2013-02-21 NOTE — Plan of Care (Signed)
Problem: Phase I Progression Outcomes Goal: Antithrombotic given by end of Day 2 Outcome: Not Met (add Reason) Pt with hemorrhage on CT.

## 2013-02-21 NOTE — Progress Notes (Signed)
PT Cancellation Note  Patient Details Name: Brady Frazier MRN: [id number] DOB: 06-17-1941   Cancelled Treatment:    Reason Eval/Treat Not Completed: Medical issues which prohibited therapy;Other (comment) (slight extension of his bleed.  Will hold today per RN) 02/21/2013  Fosston Bing, PT [phone]  (pager)  Liah Morr, Eliseo Gum 02/21/2013, 12:18 PM

## 2013-02-21 NOTE — Progress Notes (Signed)
Called by nursing staff. They were informed by patient's wife that Mr. Face always expressed his wishes of not being resuscitated in case of a major medical problem and thus she wanted to make him DNR. I spoke with wife and then placed a DNR order in the chart.  Brady Portela, MD [facility].

## 2013-02-21 NOTE — Progress Notes (Signed)
SLP Cancellation Note  Patient Details Name: Brady Frazier MRN: [id number] DOB: 1941-05-28   Cancelled treatment: ST received order for BSE/SLE and not completed due to patient presenting with lethargy with inability to participate fully.  ST to reattempt on 02/22/13. Moreen Fowler MS, CCC-SLP [phone] [facility] 02/21/2013, 3:12 PM

## 2013-02-21 NOTE — Progress Notes (Signed)
Pt vomited again with small amount of green emesis noted.  Pt's SBP has been 160's-170's but he did have a brief episode of SBP >180.  Prn hydralazine given with SBP returning to 160's-170's.  Pt was also given prn zofran earlier for nausea/vomiting.  Pt is arousable and remains oriented to person only.  MD (Neuro) notified.  No new orders given at this time.  Will continue to monitor.

## 2013-02-21 NOTE — Progress Notes (Signed)
  Echocardiogram 2D Echocardiogram has been performed.  Georgian Co 02/21/2013, 1:56 PM

## 2013-02-21 NOTE — Progress Notes (Signed)
Pt vomited moderate amount of dark green emesis.  Pt's neuro status remains unchanged.  MD (Neuro) notified.  No orders given.  Will continue to monitor and give prn zofran at next available time.

## 2013-02-22 ENCOUNTER — Inpatient Hospital Stay (HOSPITAL_COMMUNITY): Payer: Medicare HMO

## 2013-02-22 ENCOUNTER — Encounter (HOSPITAL_COMMUNITY): Payer: Self-pay | Admitting: Radiology

## 2013-02-22 LAB — GLUCOSE, CAPILLARY
Glucose-Capillary: 131 mg/dL — ABNORMAL HIGH (ref 70–99)
Glucose-Capillary: 138 mg/dL — ABNORMAL HIGH (ref 70–99)
Glucose-Capillary: 147 mg/dL — ABNORMAL HIGH (ref 70–99)
Glucose-Capillary: 152 mg/dL — ABNORMAL HIGH (ref 70–99)

## 2013-02-22 MED ORDER — LORAZEPAM 2 MG/ML IJ SOLN
1.0000 mg | Freq: Once | INTRAMUSCULAR | Status: DC
  Filled 2013-02-22: qty 1

## 2013-02-22 NOTE — Evaluation (Signed)
Clinical/Bedside Swallow Evaluation Patient Details  Name: Brady Frazier MRN: [id number] Date of Birth: 1941/01/12  Today's Date: 02/22/2013 Time: 0920-0930 SLP Time Calculation (min): 10 min  Past Medical History:  Past Medical History  Diagnosis Date  . Diabetes mellitus   . Hypertension   . Stroke   . Kidney disease    Past Surgical History:  Past Surgical History  Procedure Laterality Date  . Stomach surgery     HPI:  72 y.o. male presenting with dysarthria and LUE paresis.. Status post IV t-PA  at 13:15 on 02/20/13.. Imaging later confirmed a new left parietal parenchymal hemorrhage with some extension into the subarachnoid space and stable remote infarcts.    Assessment / Plan / Recommendation Clinical Impression  Bedside swallow evaluation complete. At this time, lethargy playing the largest role in increased aspiration risk and inefficiency of swallow with patient requiring max SLP cueing for maintenance of alert state, oral transit of bolus, and initiation of swallow. Cannot r/o component of oropharyngeal dysphagia related to acute CVA at this time as well. SLP will f/u at bedside for improvement and readiness for po intake. NPO for now.     Aspiration Risk  Severe    Diet Recommendation NPO   Medication Administration: Via alternative means    Other  Recommendations Oral Care Recommendations: Oral care QID   Follow Up Recommendations  Inpatient Rehab    Frequency and Duration min 3x week  2 weeks   Pertinent Vitals/Pain None reported    SLP Swallow Goals Goal #3: Patient will maintain aroused state for 15 minutes with min clinician cues for diagnostic po trials.  Swallow Study Goal #3 - Progress: Other (comment) (new goal)   Swallow Study    General HPI: 72 y.o. male presenting with dysarthria and LUE paresis.. Status post IV t-PA  at 13:15 on 02/20/13.. Imaging later confirmed a new left parietal parenchymal hemorrhage with some extension into the  subarachnoid space and stable remote infarcts.  Type of Study: Bedside swallow evaluation Previous Swallow Assessment: none reported Diet Prior to this Study: NPO Temperature Spikes Noted: No Respiratory Status: Room air History of Recent Intubation: No Behavior/Cognition: Lethargic;Distractible;Decreased sustained attention;Requires cueing Oral Cavity - Dentition: Adequate natural dentition Patient Positioning: Upright in bed Baseline Vocal Quality: Clear Volitional Cough: Cognitively unable to elicit Volitional Swallow: Unable to elicit    Oral/Motor/Sensory Function Overall Oral Motor/Sensory Function: Other (comment) (unable to formally assess due to current mentation)   Ice Chips Ice chips: Impaired Presentation: Spoon Oral Phase Impairments: Poor awareness of bolus;Impaired anterior to posterior transit Oral Phase Functional Implications: Prolonged oral transit;Oral holding Pharyngeal Phase Impairments: Suspected delayed Swallow;Cough - Immediate;Throat Clearing - Immediate   Thin Liquid Thin Liquid: Not tested    Nectar Thick Nectar Thick Liquid: Not tested   Honey Thick Honey Thick Liquid: Not tested   Puree Puree: Not tested   Solid   GO   Keeanna Villafranca MA, CCC-SLP [phone]  Solid: Not tested       Rakayla Ricklefs Meryl 02/22/2013,9:45 AM

## 2013-02-22 NOTE — Evaluation (Signed)
Speech Language Pathology Evaluation Patient Details Name: Brady Frazier MRN: [id number] DOB: 1941-07-12 Today's Date: 02/22/2013 Time: 4098-1191 SLP Time Calculation (min): 9 min  Problem List:  Patient Active Problem List   Diagnosis Date Noted  . Muscle weakness (generalized) 11/06/2012  . Pain in joint, shoulder region 11/06/2012  . Bursitis of shoulder, left 10/01/2012  . Shoulder pain 10/01/2012   Past Medical History:  Past Medical History  Diagnosis Date  . Diabetes mellitus   . Hypertension   . Stroke   . Kidney disease    Past Surgical History:  Past Surgical History  Procedure Laterality Date  . Stomach surgery     HPI:  72 y.o. male presenting with dysarthria and LUE paresis.. Status post IV t-PA  at 13:15 on 02/20/13.. Imaging later confirmed a new left parietal parenchymal hemorrhage with some extension into the subarachnoid space and stable remote infarcts.    Assessment / Plan / Recommendation Clinical Impression  Cognitive-linguistic evaluation complete however limited by lethargy. At this time, notable deficits in the areas of focused attention, problem solving, and both receptive and expressive language noted however difficult to ascertain aphasia vs impact of lethargy at this time. SLP will f/u for cognitive-linguistic treatment at bedside in conjunction with dysphagia f/u. Recommend CIR consult for post d/c needs.     SLP Assessment       Follow Up Recommendations  Inpatient Rehab    Frequency and Duration min 3x week      Pertinent Vitals/Pain None reported   SLP Goals  SLP Goals Progress/Goals/Alternative treatment plan discussed with pt/caregiver and they: Agree SLP Goal #1: Patient will focus attention to clinician provided stimuli with min cues.  SLP Goal #1 - Progress:  (new goal) SLP Goal #2: Patient will follow basic 1-step commands during functional ADLs with mod cues SLP Goal #2 - Progress:  (new goal) SLP Goal #3: Patient will  verbalize basic needs with max clinician cues.  SLP Goal #3 - Progress:  (new goal)  SLP Evaluation Prior Functioning  Cognitive/Linguistic Baseline: Within functional limits (per son)   Cognition  Overall Cognitive Status: Impaired/Different from baseline Arousal/Alertness: Lethargic Orientation Level: Oriented to person Attention: Focused Focused Attention: Impaired Focused Attention Impairment: Verbal basic;Functional basic Memory: Impaired Memory Impairment: Storage deficit (impacted by poor alertness and attention) Awareness: Impaired Awareness Impairment: Intellectual impairment Problem Solving: Impaired Problem Solving Impairment: Verbal basic;Functional basic (bilateral hand mits required) Safety/Judgment: Impaired    Comprehension  Auditory Comprehension Overall Auditory Comprehension: Impaired Yes/No Questions: Impaired Basic Biographical Questions: 0-25% accurate Commands: Impaired One Step Basic Commands: 0-24% accurate Interfering Components: Attention (lethargy) EffectiveTechniques: Visual/Gestural cues;Extra processing time    Expression Expression Primary Mode of Expression: Verbal Verbal Expression Overall Verbal Expression: Impaired Initiation: Impaired Level of Generative/Spontaneous Verbalization: Word Repetition: Impaired Level of Impairment: Word level Interfering Components: Attention   Oral / Motor Oral Motor/Sensory Function Overall Oral Motor/Sensory Function: Other (comment) (unable to formally assess due to current mentation) Motor Speech Overall Motor Speech: Other (comment) (unable to formally assess due to cog status)   GO   Ferdinand Lango MA, CCC-SLP [phone]   Ferdinand Lango Meryl 02/22/2013, 9:53 AM

## 2013-02-22 NOTE — Plan of Care (Signed)
Problem: Phase I Progression Outcomes Goal: Antithrombotic given by end of Day 2 Outcome: Not Met (add Reason) Pt positive for hemorrhage

## 2013-02-22 NOTE — Evaluation (Signed)
Physical Therapy Evaluation Patient Details Name: Brady Frazier MRN: [id number] DOB: 05-14-1941 Today's Date: 02/22/2013 Time: 3086-5784 PT Time Calculation (min): 25 min  PT Assessment / Plan / Recommendation Clinical Impression  Patient is a 72 y/o male admitted with slurred speech and weakness given TPA for acute stroke, later developed left parietal hematoma along with intraventricular blood and some sub-arachnoid blood in the right pareital region.  He presents with decreased arousal, decreased right side awareness, decreased right gaze, decreased balance, decreased strength and limited activity tolerance with orthostasis noted today.  He will benefit from skilled PT in the acute setting to maximize independence and allow return home with family assist following CIR stay.    PT Assessment  Patient needs continued PT services    Follow Up Recommendations  CIR    Does the patient have the potential to tolerate intense rehabilitation    Yes  Barriers to Discharge  Rehab consult      Equipment Recommendations   (TBA)    Recommendations for Other Services   None  Frequency Min 4X/week    Precautions / Restrictions Precautions Precautions: Fall Precaution Comments: Pt with mitts on hands, pulls at lines.   Pertinent Vitals/Pain No pain complaints; positive for symptoms with position changes and BP as noted; sitting 134/60, standing 92/54 and supine 169/59.      Mobility  Bed Mobility Bed Mobility: Supine to Sit;Sitting - Scoot to [place] of Bed;Sit to Supine Supine to Sit: 3: Mod assist Sitting - Scoot to Edge of Bed: 2: Max assist Sit to Supine: 4: Min assist Details for Bed Mobility Assistance: assisted feet over edge of the bed and pt pulled up with help to sit; to supine with gestures pt able to lift legs mostly in bed with min assist to get all the way in. Transfers Sit to Stand: 1: +2 Total assist Sit to Stand: Patient Percentage: 40% Stand to Sit: 1: +2 Total  assist Stand to Sit: Patient Percentage: 60% Details for Transfer Assistance: increased participation with repeated standing closer to 60% second attempt.   Ambulation/Gait Ambulation/Gait Assistance: 1: +2 Total assist Ambulation/Gait: Patient Percentage: 50% Ambulation Distance (Feet): 2 Feet Assistive device: 2 person hand held assist Ambulation/Gait Assistance Details: side stepping couple of steps to head of bed in standing prior to return to sit    Exercises Other Exercises Other Exercises: education with family regarding plan of care and recommendations for rehab.  Also reinforced stimulation on pt's right side due to left gaze.   PT Diagnosis: Abnormality of gait;Generalized weakness;Altered mental status  PT Problem List: Decreased strength;Decreased activity tolerance;Decreased cognition;Decreased balance;Decreased mobility;Decreased safety awareness PT Treatment Interventions: DME instruction;Gait training;Neuromuscular re-education;Balance training;Functional mobility training;Patient/family education;Therapeutic activities;Therapeutic exercise   PT Goals Acute Rehab PT Goals PT Goal Formulation: With patient/family Time For Goal Achievement: 03/08/13 Potential to Achieve Goals: Good Pt will go Supine/Side to Sit: with supervision PT Goal: Supine/Side to Sit - Progress: Goal set today Pt will go Sit to Supine/Side: with supervision PT Goal: Sit to Supine/Side - Progress: Goal set today Pt will go Sit to Stand: with min assist PT Goal: Sit to Stand - Progress: Goal set today Pt will go Stand to Sit: with supervision PT Goal: Stand to Sit - Progress: Goal set today Pt will Transfer Bed to Chair/Chair to Bed: with min assist PT Transfer Goal: Bed to Chair/Chair to Bed - Progress: Goal set today Pt will Stand: with supervision;with unilateral upper extremity support;3 - 5 min PT Goal: Stand -  Progress: Goal set today Pt will Ambulate: 51 - 150 feet;with least restrictive  assistive device;with min assist PT Goal: Ambulate - Progress: Goal set today  Visit Information  Last PT Received On: 02/22/13 Assistance Needed: +2 PT/OT Co-Evaluation/Treatment: Yes    Subjective Data  Subjective: Yeah! Patient Stated Goal: Per wife to return home independent   Prior Functioning  Home Living Lives With: Spouse Available Help at Discharge: Family;Available 24 hours/day Type of Home: Apartment Home Access: Level entry Home Layout: One level Bathroom Shower/Tub: Engineer, manufacturing systems: Standard Home Adaptive Equipment: Grab bars in shower;Straight cane;Shower chair with back;Quad cane Prior Function Level of Independence: Independent with assistive device(s) Driving: Yes Vocation: Retired Musician: Receptive difficulties;Expressive difficulties Dominant Hand: Right    Cognition  Cognition Arousal/Alertness: Lethargic Overall Cognitive Status: Impaired/Different from baseline Area of Impairment: Attention;Safety/judgement Current Attention Level: Focused Following Commands: Follows one step commands inconsistently;Follows one step commands with increased time General Comments: wearing mitts to avoid pulling at lines    Extremity/Trunk Assessment Right Upper Extremity Assessment RUE ROM/Strength/Tone: Unable to fully assess;Due to impaired cognition (reaches to face, head, opposite UE to scratch) Left Upper Extremity Assessment LUE ROM/Strength/Tone: Unable to fully assess;Due to impaired cognition (reaches to face, head, opposite arm to scratch) Right Lower Extremity Assessment RLE ROM/Strength/Tone: Unable to fully assess;Due to impaired cognition RLE ROM/Strength/Tone Deficits: moves antigravity, stiffness noted in knees with initial standing, improved with repeat standing Left Lower Extremity Assessment LLE ROM/Strength/Tone: Unable to fully assess;Due to impaired cognition LLE ROM/Strength/Tone Deficits: moves  antigravity, stiffness noted in knees with initial standing, improved with repeat standing Trunk Assessment Trunk Assessment: Normal   Balance Balance Balance Assessed: Yes Static Sitting Balance Static Sitting - Balance Support: Feet supported;Left upper extremity supported Static Sitting - Level of Assistance: 5: Stand by assistance Static Standing Balance Static Standing - Balance Support: Bilateral upper extremity supported Static Standing - Level of Assistance: 1: +2 Total assist (60%) Static Standing - Comment/# of Minutes: weight shifted to right in standing approx 1-1.5 minutes limited by orthostasis  End of Session PT - End of Session Equipment Utilized During Treatment: Gait belt Activity Tolerance: Patient limited by fatigue;Other (comment) (decreased BP in upright positions) Patient left: in bed;with family/visitor present;with bed alarm set  GP     [facility] 02/22/2013, 1:22 PM [place], [place] [phone] 02/22/2013

## 2013-02-22 NOTE — Plan of Care (Signed)
Problem: tPA Day Progression Outcomes-Only if tPA administered Goal: Post tPA image without hemorrhage Outcome: Not Met (add Reason) Pt positive for hemorrhage Goal: Post tPA neurologically at baseline or improved CRITERIA FOR NEUROLOGICALLY AT BASELINE OR IMPROVED: - NO S/S OF INC. ICP - AWAKE, ALERT, ORIENTED X3 - SPEECH CLEAR, APPROPRIATE - PERRL - EOMS, BLINK INTACT - FACE SYMMETRICAL - TONGUE/TRACH MIDLINE - GRIPS, PUSH/PULL EQUAL - NO PRONATOR DRIFT - DORSIPLANTAR FLEXION EQUAL - MOVES ALL EXTREMITIES - SENSATION INTACT - NO NUCHAL RIGIDITY OR PHOTOPHOBIA  Outcome: Not Met (add Reason) No orders per Dr Thad Ranger  Problem: Phase I Progression Outcomes Goal: Antithrombotic given by end of Day 2 Outcome: Not Met (add Reason) Pt positive for hemorrhage

## 2013-02-22 NOTE — Evaluation (Signed)
Occupational Therapy Evaluation Patient Details Name: Brady Frazier MRN: [id number] DOB: 03/12/1941 Today's Date: 02/22/2013 Time: 4098-1191 OT Time Calculation (min): 28 min  OT Assessment / Plan / Recommendation Clinical Impression  Pt admitted with CVA, L posterior parietal hemorrhage s/p TPA.  Pt also with remote infarcts. Presents with lethargy, decreased attention to R visual field, focused attention, impaired cognition and language deficits.  He is dependent in all aspects of ADL and requires +2 assist for OOB activity.  Pt observed to use B UEs for reaching to his face, head and opposite arm, but unable to ascertain the extent of any weakness due to lethargy and cognition. Recommend CIR for intensive rehab.    OT Assessment  Patient needs continued OT Services    Follow Up Recommendations  CIR    Barriers to Discharge      Equipment Recommendations  3 in 1 bedside comode    Recommendations for Other Services Rehab consult  Frequency  Min 3X/week    Precautions / Restrictions Precautions Precautions: Fall Precaution Comments: Pt with mitts on hands, pulls at lines.   Pertinent Vitals/Pain No pain, monitored throughout, dizziness with drop in BP recorded with position change.    ADL  Eating/Feeding: NPO Equipment Used: Gait belt Transfers/Ambulation Related to ADLs: Did not transfer or ambulate due to symptomatic orthostasis with sitting and standing ADL Comments: Pt with lethargy.  Requiring both physical and verbal cues for simple one step direction following.  Dependent in all aspects of ADL.  NPO pending swallowing assessment.    OT Diagnosis: Generalized weakness;Cognitive deficits;Blindness and low vision  OT Problem List: Decreased strength;Decreased activity tolerance;Impaired balance (sitting and/or standing);Decreased cognition;Decreased safety awareness;Decreased knowledge of use of DME or AE;Obesity OT Treatment Interventions: Self-care/ADL training;Cognitive  remediation/compensation;Therapeutic activities;DME and/or AE instruction;Visual/perceptual remediation/compensation;Patient/family education;Balance training   OT Goals Acute Rehab OT Goals OT Goal Formulation: Patient unable to participate in goal setting Time For Goal Achievement: 03/08/13 Potential to Achieve Goals: Good ADL Goals Pt Will Perform Grooming: with min assist;Sitting, edge of bed ADL Goal: Grooming - Progress: Goal set today Pt Will Transfer to Toilet: with min assist;with DME;3-in-1 ADL Goal: Toilet Transfer - Progress: Goal set today Miscellaneous OT Goals Miscellaneous OT Goal #1: Pt will perform bed mobility with supervision as a precursor to ADL at EOB. OT Goal: Miscellaneous Goal #1 - Progress: Goal set today Miscellaneous OT Goal #2: Pt will turn his head to the R to locate designated items in room/on tray table with head turn and min verbal cues. OT Goal: Miscellaneous Goal #2 - Progress: Goal set today Miscellaneous OT Goal #3: Pt will follow one step directions with min verbal and physical cues. OT Goal: Miscellaneous Goal #3 - Progress: Goal set today Miscellaneous OT Goal #4: Pt will participate in 20 minutes of activity remaining alert. OT Goal: Miscellaneous Goal #4 - Progress: Goal set today  Visit Information  Last OT Received On: 02/22/13 Assistance Needed: +2 PT/OT Co-Evaluation/Treatment: Yes    Subjective Data  Subjective: "Hey."   Prior Functioning     Home Living Lives With: Spouse Available Help at Discharge: Family;Available 24 hours/day Type of Home: Apartment Home Access: Level entry Home Layout: One level Bathroom Shower/Tub: Engineer, manufacturing systems: Standard Home Adaptive Equipment: Grab bars in shower;Straight cane;Shower chair with back;Quad cane Prior Function Level of Independence: Independent with assistive device(s) Driving: Yes Vocation: Retired Musician: Receptive difficulties;Expressive  difficulties Dominant Hand: Right         Vision/Perception Vision -  History Baseline Vision: Wears glasses only for reading Vision - Assessment Vision Assessment: Vision impaired - to be further tested in functional context Additional Comments: Pt without ability to track.  Does turn eyes or track to R side.  Instructed wife to speak to pt from R side. Perception Perception: Not tested Praxis Praxis: Not tested   Cognition  Cognition Arousal/Alertness: Lethargic Overall Cognitive Status: Impaired/Different from baseline Area of Impairment: Attention;Safety/judgement Current Attention Level: Focused Following Commands: Follows one step commands inconsistently;Follows one step commands with increased time General Comments: wearing mitts to avoid pulling at lines    Extremity/Trunk Assessment Right Upper Extremity Assessment RUE ROM/Strength/Tone: Unable to fully assess;Due to impaired cognition (reaches to face, head, opposite UE to scratch) Left Upper Extremity Assessment LUE ROM/Strength/Tone: Unable to fully assess;Due to impaired cognition (reaches to face, head, opposite arm to scratch) Right Lower Extremity Assessment RLE ROM/Strength/Tone: Unable to fully assess;Due to impaired cognition RLE ROM/Strength/Tone Deficits: moves antigravity, stiffness noted in knees with initial standing, improved with repeat standing Left Lower Extremity Assessment LLE ROM/Strength/Tone: Unable to fully assess;Due to impaired cognition LLE ROM/Strength/Tone Deficits: moves antigravity, stiffness noted in knees with initial standing, improved with repeat standing Trunk Assessment Trunk Assessment: Normal     Mobility Bed Mobility Bed Mobility: Supine to Sit;Sitting - Scoot to [place] of Bed;Sit to Supine Supine to Sit: 3: Mod assist Sitting - Scoot to Edge of Bed: 2: Max assist Sit to Supine: 4: Min assist Details for Bed Mobility Assistance: assisted feet over edge of the bed and pt  pulled up with help to sit; to supine with gestures pt able to lift legs mostly in bed with min assist to get all the way in. Transfers Transfers: Sit to Stand;Stand to Sit Sit to Stand: 1: +2 Total assist Sit to Stand: Patient Percentage: 40% Stand to Sit: 1: +2 Total assist Stand to Sit: Patient Percentage: 60% Details for Transfer Assistance: increased participation with repeated standing closer to 60% second attempt.       Exercise Other Exercises Other Exercises: education with family regarding plan of care and recommendations for rehab.  Also reinforced stimulation on pt's right side due to left gaze.   Balance Balance Balance Assessed: Yes Static Sitting Balance Static Sitting - Balance Support: Feet supported;Left upper extremity supported Static Sitting - Level of Assistance: 5: Stand by assistance Static Standing Balance Static Standing - Balance Support: Bilateral upper extremity supported Static Standing - Level of Assistance: 1: +2 Total assist (60%) Static Standing - Comment/# of Minutes: weight shifted to right in standing approx 1-1.5 minutes limited by orthostasis   End of Session OT - End of Session Equipment Utilized During Treatment: Gait belt Activity Tolerance: Treatment limited secondary to medical complications (Comment) (orthostasis) Patient left: in bed;with call bell/phone within reach;with family/visitor present  GO     Evern Bio 02/22/2013, 1:15 PM [phone]

## 2013-02-22 NOTE — Progress Notes (Signed)
Stroke Team Progress Note  HISTORY Brady Frazier is a 72 y.o. male who was well on the morning of 02/20/13 per family. Patient was watching television with his wife when she noted that he was drooling. Wife then began to ask questions of the patient and his answers were not accurate, speech was slurred. Patient is at baseline ambulatory and walking with a limp but was unable to ambulate at this time as well. Patient was brought to the ED at that time for further evaluation where he was evaluated by Teleneurology services. tPA was administered and patient was transferred to [facility] for further care. Patient's pre-tPA NIHSS was 7. Upon arrival at [facility] NIH score was 5.  The patient later developed an intracranial hemorrhage after receiving the TPA. Yesterday PM and seems to have declined even further thru the night  Date last known well: 02/20/2013  Time last known well: 1045  tPA Given: Yes  SUBJECTIVE Multiple family members present today. Dr. Pearlean Brownie had a long discussion regarding the patient's condition and prognosis. All questions were answered.patient more awake today but remains aphasic.BP adequately controlled. F/U CY head this am shows stable ppearance of left pareital and IVH .  OBJECTIVE Most recent Vital Signs: Filed Vitals:   02/22/13 0700 02/22/13 0756 02/22/13 0800 02/22/13 0900  BP: 151/73  166/60 167/62  Pulse: 73  73 67  Temp:  99.4 F (37.4 C)    TempSrc:  Axillary    Resp: 18  20 20   Height:      Weight:      SpO2: 98%  98% 98%   CBG (last 3)   Recent Labs  02/21/13 2356 02/22/13 0351 02/22/13 0754  GLUCAP 131* 138* 152*    IV Fluid Intake:   . sodium chloride 100 mL/hr at 02/20/13 1515  . sodium chloride 75 mL/hr at 02/22/13 0900  . niCARDipine      MEDICATIONS  . antiseptic oral rinse  15 mL Mouth Rinse q12n4p  . chlorhexidine  15 mL Mouth Rinse BID  . insulin aspart  0-15 Units Subcutaneous Q4H  . pantoprazole (PROTONIX) IV  40 mg Intravenous QHS   PRN:   acetaminophen, acetaminophen, hydrALAZINE, labetalol, ondansetron (ZOFRAN) IV, senna-docusate  Diet:  NPO ? thin liquids Activity:  Bedrest  DVT Prophylaxis:  SCDs  CLINICALLY SIGNIFICANT STUDIES Basic Metabolic Panel:   Recent Labs Lab 02/20/13 1154  NA 136  K 4.8  CL 103  CO2 26  GLUCOSE 244*  BUN 19  CREATININE 2.19*  CALCIUM 8.7   Liver Function Tests:   Recent Labs Lab 02/20/13 1154  AST 17  ALT 14  ALKPHOS 71  BILITOT 0.4  PROT 6.9  ALBUMIN 3.0*   CBC:   Recent Labs Lab 02/20/13 1154  WBC 7.2  HGB 11.3*  HCT 31.4*  MCV 84.9  PLT 214   Coagulation:   Recent Labs Lab 02/20/13 1154  LABPROT 13.8  INR 1.07   Cardiac Enzymes:   Recent Labs Lab 02/20/13 1154  TROPONINI <0.30   Urinalysis:   Recent Labs Lab 02/20/13 1506  COLORURINE YELLOW  LABSPEC 1.011  PHURINE 6.5  GLUCOSEU 100*  HGBUR TRACE*  BILIRUBINUR NEGATIVE  KETONESUR NEGATIVE  PROTEINUR >300*  UROBILINOGEN 1.0  NITRITE NEGATIVE  LEUKOCYTESUR NEGATIVE   Lipid Panel    Component Value Date/Time   CHOL 152 02/21/2013 0330   TRIG 56 02/21/2013 0330   HDL 62 02/21/2013 0330   CHOLHDL 2.5 02/21/2013 0330   VLDL 11 02/21/2013  0330   LDLCALC 79 02/21/2013 0330   HgbA1C  Lab Results  Component Value Date   HGBA1C 5.7* 02/21/2013    Urine Drug Screen:   No results found for this basename: labopia,  cocainscrnur,  labbenz,  amphetmu,  thcu,  labbarb    Alcohol Level: No results found for this basename: ETH,  in the last 168 hours  Ct Head Wo Contrast  02/20/2013 18:37 1.  New left parietal parenchymal hemorrhage with some extension into the subarachnoid space in the posterior left sylvian fissure and tentorium. 2.  Stable remote infarcts.  Ct Head Wo Contrast  02/20/2013 12:16 1.  Remote left superior cerebellar infarct. 2.  Stable additional bilateral remote infarcts of the cerebellum, posterior parietal and occipital lobes bilaterally.  Ischemic changes are predominately  in the posterior circulation. 3.  Stable atrophy and white matter disease. 4.  Stable ventricular dilation, proportionate to the atrophy and with ex vacuo dilation associated with prior infarcts.      Dg Chest Port 1 View  02/20/2013   1.  Low lung volumes and minimal bibasilar atelectasis.  2.  No acute cardiopulmonary disease.      MRI of the brain  Pending  MRA of the brain  - Pending  2D Echocardiogram  - Pending  Carotid Doppler  - Pending  EKG  SR rate 64 BPM  Therapy Recommendations - Pending  Physical Exam   Elderly african american male  Not in distress.  Afebrile. Head is nontraumatic. Neck is supple without bruit. Cardiac exam no murmur or gallop. Lungs are clear to auscultation. Distal pulses are well felt.  NEUROLOGIC:   MENTAL STATUS: Drowsy barely arouses. Speech nonfluent and few words only,  follows simple commands.  CRANIAL NERVES: pupils equal and reactive to light, left gaze preference. Able to look to right upto midline only. Decrease blink to threat on right side.,right lower face weakness. uvula midlinec, tongue midline MOTOR: normal bulk and tone, Strength  Mildly diminished on right but has antigravity movements  Left more than right- SENSORY: normal and symmetric to light touch  COORDINATION: finger-nose-finger and  - heel to shin unable to test REFLEXES: deep tendon reflexes present and symmetric - no babinski    ASSESSMENT Mr. Brady Frazier is a 72 y.o. male presenting with dysarthria and mild right hemiparesis.. Status post IV t-PA  at 13:15 on 02/20/13.. Imaging later confirmed a new left parietal parenchymal hemorrhage with some extension into the subarachnoid space and stable remote infarcts. Infarcts felt to be embolic secondary to unknown etiology.  On no anticoagulation prior to admission. Now on no anticoagulation secondary to TPA and hemorrhage. Patient with resultant lethargy,right uppper extremity paresis,  . Work up underway.   DM -  HEMOGLOBIN A1c 5.7  Htn  Renal Disease  Previous strokes  Anemia.  Hospital day # 2  TREATMENT/PLAN  Continue to hold anticoagulation.  Await MRI/MRA, Echo, Dopplers  Therapy evaluations  Risk factor modification.  Strict Control hypertension. And change BP goal to below 160.  Keep NPO till evaluated by speech therapy  MRI today  Discussed with multiple family members at bedside.  Delton See PA-C [facility] Pager [phone] 02/22/2013, 9:54 AM I have personally obtained a history, examined the patient, evaluated imaging results, and formulated the assessment and plan of care. I agree with the above. This patient is critically ill and at significant risk of neurological worsening, death and care requires constant monitoring of vital signs, hemodynamics,respiratory and cardiac  monitoring,review of multiple databases, neurological assessment, discussion with family, other specialists and medical decision making of high complexity. I spent 30 minutes of neurocritical care time  in the care of  this patient  Delia Heady, MD   .

## 2013-02-22 NOTE — Progress Notes (Signed)
Portable EEG completed

## 2013-02-22 NOTE — Progress Notes (Signed)
VASCULAR LAB PRELIMINARY  PRELIMINARY  PRELIMINARY  PRELIMINARY  Carotid duplex  completed.    Preliminary report:  Bilateral:  No evidence of hemodynamically significant internal carotid artery stenosis.   Vertebral artery flow is antegrade.      Brady Frazier, RVT 02/22/2013, 3:34 PM

## 2013-02-22 NOTE — Progress Notes (Signed)
Rehab Admissions Coordinator Note:  Patient was screened by Clois Dupes for appropriateness for an Inpatient Acute Rehab Consult. Patient has [employer] which typically will not approve inpatient rehab admissions. I have left a message for insurance liason onsite as well as offsite to discuss likelihood of approval. No return call today. At this time, we are recommending Inpatient Rehab consult pending our assessment and insurance approval.   Clois Dupes 02/22/2013, 4:37 PM  I can be reached at [phone].

## 2013-02-23 DIAGNOSIS — I63512 Cerebral infarction due to unspecified occlusion or stenosis of left middle cerebral artery: Secondary | ICD-10-CM | POA: Diagnosis present

## 2013-02-23 DIAGNOSIS — I619 Nontraumatic intracerebral hemorrhage, unspecified: Secondary | ICD-10-CM | POA: Diagnosis not present

## 2013-02-23 DIAGNOSIS — I10 Essential (primary) hypertension: Secondary | ICD-10-CM | POA: Diagnosis present

## 2013-02-23 DIAGNOSIS — E119 Type 2 diabetes mellitus without complications: Secondary | ICD-10-CM | POA: Diagnosis present

## 2013-02-23 LAB — GLUCOSE, CAPILLARY
Glucose-Capillary: 127 mg/dL — ABNORMAL HIGH (ref 70–99)
Glucose-Capillary: 127 mg/dL — ABNORMAL HIGH (ref 70–99)

## 2013-02-23 MED ORDER — ATORVASTATIN CALCIUM 20 MG PO TABS
20.0000 mg | ORAL_TABLET | Freq: Every day | ORAL | Status: DC
  Administered 2013-02-23 – 2013-02-25 (×3): 20 mg via ORAL
  Filled 2013-02-23 (×4): qty 1

## 2013-02-23 MED ORDER — MORPHINE SULFATE 2 MG/ML IJ SOLN
2.0000 mg | INTRAMUSCULAR | Status: DC | PRN
  Administered 2013-02-23 – 2013-02-24 (×3): 2 mg via INTRAVENOUS
  Filled 2013-02-23 (×3): qty 1

## 2013-02-23 MED ORDER — HYDROCHLOROTHIAZIDE 25 MG PO TABS
25.0000 mg | ORAL_TABLET | Freq: Every day | ORAL | Status: DC
  Administered 2013-02-23 – 2013-02-26 (×4): 25 mg via ORAL
  Filled 2013-02-23 (×4): qty 1

## 2013-02-23 MED ORDER — INSULIN ASPART 100 UNIT/ML ~~LOC~~ SOLN
0.0000 [IU] | Freq: Three times a day (TID) | SUBCUTANEOUS | Status: DC
  Administered 2013-02-23 – 2013-02-24 (×2): 2 [IU] via SUBCUTANEOUS
  Administered 2013-02-24: 3 [IU] via SUBCUTANEOUS
  Administered 2013-02-24: 2 [IU] via SUBCUTANEOUS
  Administered 2013-02-25: 3 [IU] via SUBCUTANEOUS
  Administered 2013-02-26: 2 [IU] via SUBCUTANEOUS
  Administered 2013-02-26: 8 [IU] via SUBCUTANEOUS

## 2013-02-23 MED ORDER — ENOXAPARIN SODIUM 40 MG/0.4ML ~~LOC~~ SOLN
40.0000 mg | SUBCUTANEOUS | Status: DC
  Administered 2013-02-23: 40 mg via SUBCUTANEOUS
  Filled 2013-02-23 (×2): qty 0.4

## 2013-02-23 MED ORDER — AMLODIPINE BESYLATE 5 MG PO TABS
5.0000 mg | ORAL_TABLET | Freq: Every day | ORAL | Status: DC
  Administered 2013-02-23 – 2013-02-26 (×4): 5 mg via ORAL
  Filled 2013-02-23 (×4): qty 1

## 2013-02-23 NOTE — Progress Notes (Signed)
Speech Language Pathology Dysphagia and Cognitive-Linguistic Treatment Patient Details Name: Brady Frazier MRN: [id number] DOB: 1941/01/29 Today's Date: 02/23/2013 Time: 1012-1040 SLP Time Calculation (min): 28 min  Assessment / Plan / Recommendation Clinical Impression  Skilled treatment focused on diagnostic po trials and facilitation of cognitive-linguistic abilitites. Max verbal, tactile, and visual cueing required for labial opening for po trials due to poor awareness of situation. Once pos initiated, patient presented with what appears to be a functional oropharyngeal swallow without overt evidence of aspiration however solid po trials limited to minimal amounts of purees only due to general refusal. Vocal quality remained clear and timing of swallow appears appropriate. Verbalization of basic needs/wants required max clinician questioning cues with patient grunting "uh-uh (no)" to most questions. Max cues for focused attention to task as well as for carryout of basic 1-step directions during functional self feeding ADL. Recommend initiation of conservative diet (dysphagia 1 with thin liquid) with general safe swallowing precautions and continued f/u for diet tolerance and facilitation of cognition and communication.     Diet Recommendation  Initiate / Change Diet: Dysphagia 1 (puree);Thin liquid    SLP Plan Goals updated   Pertinent Vitals/Pain None observed   Swallowing Goals  SLP Swallowing Goals Patient will utilize recommended strategies during swallow to increase swallowing safety with: Maximum assistance Swallow Study Goal #2 - Progress:  (new goal) Goal #3: Patient will maintain aroused state for 15 minutes with min clinician cues for diagnostic po trials.  Swallow Study Goal #3 - Progress: Met  General Temperature Spikes Noted: Yes Respiratory Status: Room air Behavior/Cognition: Alert;Requires cueing;Distractible;Decreased sustained attention Oral Cavity - Dentition:  Adequate natural dentition Patient Positioning: Upright in bed  Oral Cavity - Oral Hygiene Does patient have any of the following "at risk" factors?: Nutritional status - inadequate Patient is HIGH RISK - Oral Care Protocol followed (see row info): Yes   Dysphagia Treatment Treatment focused on: Upgraded PO texture trials;Skilled observation of diet tolerance (cognitive-linguistic skills) Treatment Methods/Modalities: Skilled observation;Differential diagnosis Patient observed directly with PO's: Yes Type of PO's observed: Dysphagia 1 (puree);Thin liquids;Dysphagia 3 (soft) Feeding: Able to feed self;Needs assist Liquids provided via: Cup;Straw Oral Phase Signs & Symptoms:  (limited labial opening for solid) Type of cueing: Verbal;Tactile;Visual Amount of cueing: Maximal   GO   Brady Lango MA, CCC-SLP [phone]   Brady Frazier 02/23/2013, 11:06 AM

## 2013-02-23 NOTE — Progress Notes (Signed)
Utilization review completed.  P.J. Ayahna Solazzo,RN,BSN Case Manager [phone]  

## 2013-02-23 NOTE — Progress Notes (Signed)
Stroke Team Progress Note  HISTORY Brady Frazier is a 72 y.o. male who was well on the morning of 02/20/13 per family. Patient was watching television with his wife when she noted that he was drooling. Wife then began to ask questions of the patient and his answers were not accurate, speech was slurred. Patient is at baseline ambulatory and walking with a limp but was unable to ambulate at this time as well. Patient was brought to [facility] ED at that time for further evaluation where he was evaluated by Teleneurology services. tPA was administered and patient was transferred to [facility] for further care. Patient's pre-tPA NIHSS was 7. Upon arrival at [facility] NIH score was 5.  SUBJECTIVE Family presented to bedside during rounds. Patient more awake, not speaking.  OBJECTIVE Most recent Vital Signs: Filed Vitals:   02/23/13 0630 02/23/13 0645 02/23/13 0700 02/23/13 0800  BP: 141/56 154/60 151/62 158/63  Pulse: 73 77 76 93  Temp:    99 F (37.2 C)  TempSrc:    Oral  Resp: 20 17 18 18   Height:      Weight:      SpO2:   98% 97%   CBG (last 3)   Recent Labs  02/23/13 0047 02/23/13 0353 02/23/13 0834  GLUCAP 150* 127* 127*   IV Fluid Intake:   . sodium chloride 100 mL/hr at 02/20/13 1515  . sodium chloride 75 mL/hr at 02/23/13 0800  . niCARDipine 3 mg/hr (02/23/13 0800)   MEDICATIONS  . antiseptic oral rinse  15 mL Mouth Rinse q12n4p  . chlorhexidine  15 mL Mouth Rinse BID  . insulin aspart  0-15 Units Subcutaneous Q4H  . LORazepam  1 mg Intravenous Once  . pantoprazole (PROTONIX) IV  40 mg Intravenous QHS   PRN:  acetaminophen, acetaminophen, hydrALAZINE, labetalol, ondansetron (ZOFRAN) IV, senna-docusate  Diet:  NPO Activity:  Bedrest  DVT Prophylaxis:  SCDs  CLINICALLY SIGNIFICANT STUDIES Basic Metabolic Panel:   Recent Labs Lab 02/20/13 1154  NA 136  K 4.8  CL 103  CO2 26  GLUCOSE 244*  BUN 19  CREATININE 2.19*  CALCIUM 8.7   Liver Function Tests:   Recent Labs Lab  02/20/13 1154  AST 17  ALT 14  ALKPHOS 71  BILITOT 0.4  PROT 6.9  ALBUMIN 3.0*   CBC:   Recent Labs Lab 02/20/13 1154  WBC 7.2  HGB 11.3*  HCT 31.4*  MCV 84.9  PLT 214   Coagulation:   Recent Labs Lab 02/20/13 1154  LABPROT 13.8  INR 1.07   Cardiac Enzymes:   Recent Labs Lab 02/20/13 1154  TROPONINI <0.30   Urinalysis:   Recent Labs Lab 02/20/13 1506  COLORURINE YELLOW  LABSPEC 1.011  PHURINE 6.5  GLUCOSEU 100*  HGBUR TRACE*  BILIRUBINUR NEGATIVE  KETONESUR NEGATIVE  PROTEINUR >300*  UROBILINOGEN 1.0  NITRITE NEGATIVE  LEUKOCYTESUR NEGATIVE   Lipid Panel    Component Value Date/Time   CHOL 152 02/21/2013 0330   TRIG 56 02/21/2013 0330   HDL 62 02/21/2013 0330   CHOLHDL 2.5 02/21/2013 0330   VLDL 11 02/21/2013 0330   LDLCALC 79 02/21/2013 0330   HgbA1C  Lab Results  Component Value Date   HGBA1C 5.7* 02/21/2013   Urine Drug Screen:   No results found for this basename: labopia,  cocainscrnur,  labbenz,  amphetmu,  thcu,  labbarb    Alcohol Level: No results found for this basename: ETH,  in the last 168 hours  Ct Head  Wo Contrast 02/20/2013 18:37  1.  New left parietal parenchymal hemorrhage with some extension into the subarachnoid space in the posterior left sylvian fissure and tentorium. 2.  Stable remote infarcts. 02/20/2013 12:16  1.  Remote left superior cerebellar infarct. 2.  Stable additional bilateral remote infarcts of the cerebellum, posterior parietal and occipital lobes bilaterally.  Ischemic changes are predominately in the posterior circulation. 3.  Stable atrophy and white matter disease. 4.  Stable ventricular dilation, proportionate to the atrophy and with ex vacuo dilation associated with prior infarcts.      Chest Xray 02/20/2013   1.  Low lung volumes and minimal bibasilar atelectasis.  2.  No acute cardiopulmonary disease.     MRI of the brain 02/22/2013  68 x 52 x 42 mm, approximate 70 ml intracerebral hemorrhage, with  intraventricular extension. Multiple areas of left hemisphere restricted diffusion consistent with acute non hemorrhagic embolic infarction.  Findings consistent with post t-PA hemorrhage without visible underlying vascular malformation.  Areas of atrophy, small vessel disease, and chronic infarction elsewhere   MRA of the brain 02/22/2013   No proximal stenosis or vascular malformation.  No intracranial aneurysm is evident.   2D Echocardiogram  EF 60-65% with no source of embolus.   Carotid Doppler No evidence of hemodynamically significant internal carotid artery stenosis. Vertebral artery flow is antegrade.   EKG  SR rate 64 BPM  EEG   Therapy Recommendations   Physical Exam   Elderly african american male  Not in distress.  Afebrile. Head is nontraumatic. Neck is supple without bruit. Cardiac exam no murmur or gallop. Lungs are clear to auscultation. Distal pulses are well felt.  NEUROLOGIC:  MENTAL STATUS: Alert. Speech nonfluent and few words only,  follows simple commands.  CRANIAL NERVES: pupils equal and reactive to light, left gaze preference. Able to look to right upto midline only. Decrease blink to threat on right side.,right lower face weakness. uvula midlinec, tongue midline MOTOR: normal bulk and tone, Strength  Mildly diminished on right but has antigravity movements  Left more than right- SENSORY: normal and symmetric to light touch  COORDINATION: finger-nose-finger and  - heel to shin unable to test REFLEXES: deep tendon reflexes present and symmetric - no babinski  ASSESSMENT Mr. Brady Frazier is a 72 y.o. male presenting with dysarthria and mild right hemiparesis. Status post IV t-PA  at 13:15 on 02/20/13 at [facility], transferred to [facility] for care. patient with post tpa symptomatic left parietal parenchymal hemorrhage with extension into the subarachnoid space. 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 Infarcts felt to be secondary to unknown etiology.  On no  antiplatelets prior to admission. Now on no antiplatelets secondary to post tPA hemorrhage. Patient with resultant  right hemiparesis, global aphasia, dysphagia. Work up underway.   DM - HEMOGLOBIN A1c 5.7 Hypertension  Renal Disease  Previous strokes  Anemia.  Hospital day # 3  TREATMENT/PLAN  F/u EEG  OOB, Therapy evaluations  Risk factor modification.  Strict Control hypertension. Increase SBP goal to below 180. Wean caredene. Use prn labetolol/hydralazine if needed. Add po medication if able to swallow.  Keep NPO till evaluated by speech therapy  lovenox for VTE prophy TEE to look for embolic source. Arranged with [facility] for Thursday.  If positive for PFO (patent foramen ovale), check bilateral lower extremity venous dopplers to rule out DVT as possible source of stroke.  Transfer to floor once stable off cardene  Check labs in am  Discussed with  family members at bedside.  Annie Main, MSN, RN, ANVP-BC, ANP-BC, [facility] Pager: [phone] 02/23/2013 8:46 AM  I have personally obtained a history, examined the patient, evaluated imaging results, and formulated the assessment and plan of care. I agree with the above. Delia Heady, MD

## 2013-02-23 NOTE — Progress Notes (Signed)
Physical Therapy Treatment Patient Details Name: Brady Frazier MRN: [id number] DOB: 1941/03/23 Today's Date: 02/23/2013 Time: 0865-7846 PT Time Calculation (min): 26 min  PT Assessment / Plan / Recommendation Comments on Treatment Session  Patient tolerating upright position better without orthostatic drop in BP.  Feel more alert to begin with today, but seen in cooperation with speech therapist to ensure pt able to complete swallow assessment.  Will progress to gait next visit.    Follow Up Recommendations  CIR     Does the patient have the potential to tolerate intense rehabilitation   yes   Barriers to Discharge  none      Equipment Recommendations  Other (comment) (TBA)       Frequency Min 4X/week   Plan Discharge plan remains appropriate    Precautions / Restrictions Precautions Precautions: Fall Precaution Comments: Pt with mitts on hands, pulls at lines.   Pertinent Vitals/Pain No pain complaints    Mobility  Bed Mobility Supine to Sit: 3: Mod assist;HOB elevated Sitting - Scoot to Edge of Bed: 3: Mod assist Details for Bed Mobility Assistance: assisted feet over edge and to lift trunk up to allow pt to sit upright Transfers Transfers: Stand Pivot Transfers Sit to Stand: 3: Mod assist;With upper extremity assist;From bed Stand to Sit: 3: Mod assist;To chair/3-in-1;Without upper extremity assist Stand Pivot Transfers: 3: Mod assist Details for Transfer Assistance: facilitation for anterior weight shift for sit>stand; cues to reach back for chair, but pt did not follow so needed assist to lower safely in chair; cues for stepping around to chair and support on right side due to weakness on right side.      PT Goals Acute Rehab PT Goals Pt will go Supine/Side to Sit: with supervision PT Goal: Supine/Side to Sit - Progress: Progressing toward goal Pt will go Sit to Stand: with min assist PT Goal: Sit to Stand - Progress: Progressing toward goal Pt will go Stand  to Sit: with supervision PT Goal: Stand to Sit - Progress: Progressing toward goal Pt will Transfer Bed to Chair/Chair to Bed: with min assist PT Transfer Goal: Bed to Chair/Chair to Bed - Progress: Progressing toward goal Pt will Stand: with supervision;with unilateral upper extremity support;3 - 5 min PT Goal: Stand - Progress: Progressing toward goal  Visit Information  Last PT Received On: 02/23/13 Assistance Needed: +2    Subjective Data  Subjective: Okay   Cognition  Cognition Arousal/Alertness: Awake/alert Behavior During Therapy: Restless Overall Cognitive Status: Impaired/Different from baseline Area of Impairment: Attention;Safety/judgement Current Attention Level: Sustained Following Commands: Follows one step commands inconsistently;Follows one step commands with increased time General Comments: initially refusing to participate with swallowing with speech theapist, later more participative.    Balance  Static Sitting Balance Static Sitting - Balance Support: Feet supported;No upper extremity supported Static Sitting - Level of Assistance: 5: Stand by assistance Static Sitting - Comment/# of Minutes: sat edge of bed approx 10 minutes during swallow assessment with speech therapist.  End of Session PT - End of Session Equipment Utilized During Treatment: Gait belt Activity Tolerance: Patient tolerated treatment well Patient left: in chair;with family/visitor present;with call bell/phone within reach Nurse Communication: Mobility status   GP     [facility] 02/23/2013, 2:13 PM [place], PT [phone] 02/23/2013

## 2013-02-23 NOTE — Consult Note (Signed)
Physical Medicine and Rehabilitation Consult Reason for Consult: Left parietal parenchymal hemorrhage Referring Physician: Dr. Pearlean Brownie   HPI: Brady Frazier is a 72 y.o. right-handed male with history of diabetes mellitus and peripheral neuropathy as well as hypertension. Admitted 02/20/2013 with slurred speech and decrease in balance as well as right-sided weakness. 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 Patient did receive TPA. Followup imaging shows new left parietal parenchymal hemorrhage with some extension to the subarachnoid space and stable remote infarcts. Carotid Dopplers with no ICA stenosis. EEG is pending. Echocardiogram with ejection fraction of 65% grade 1 diastolic dysfunction. Neurology service with workup ongoing. Patient is currently n.p.o. with followup per speech therapy. Bedside sitter for patient safety due to restlessness. Physical and occupational therapy evaluation completed 02/22/2013 with recommendations of physical medicine rehabilitation consult to consider inpatient rehabilitation services.   Review of Systems  Unable to perform ROS  Past Medical History  Diagnosis Date  . Diabetes mellitus   . Hypertension   . Stroke   . Kidney disease    Past Surgical History  Procedure Laterality Date  . Stomach surgery     History reviewed. No pertinent family history. Social History:  reports that he has never smoked. He does not have any smokeless tobacco history on file. He reports that he does not drink alcohol or use illicit drugs. Allergies: No Known Allergies Medications Prior to Admission  Medication Sig Dispense Refill  . amLODipine (NORVASC) 5 MG tablet Take 5 mg by mouth daily.      Marland Kitchen atorvastatin (LIPITOR) 20 MG tablet Take 20 mg by mouth daily.      . insulin glargine (LANTUS) 100 UNIT/ML injection Inject 25 Units into the skin at bedtime.         Home: Home Living Lives With: Spouse Available Help at Discharge: Family;Available  24 hours/day Type of Home: Apartment Home Access: Level entry Home Layout: One level Bathroom Shower/Tub: Engineer, manufacturing systems: Standard Home Adaptive Equipment: Grab bars in shower;Straight cane;Shower chair with back;Quad cane  Functional History: Prior Function Driving: Yes Vocation: Retired Functional Status:  Mobility: Bed Mobility Bed Mobility: Supine to Sit;Sitting - Scoot to [place] of Bed;Sit to Supine Supine to Sit: 3: Mod assist Sitting - Scoot to Edge of Bed: 2: Max assist Sit to Supine: 4: Min assist Transfers Sit to Stand: 1: +2 Total assist Sit to Stand: Patient Percentage: 40% Stand to Sit: 1: +2 Total assist Stand to Sit: Patient Percentage: 60% Ambulation/Gait Ambulation/Gait Assistance: 1: +2 Total assist Ambulation/Gait: Patient Percentage: 50% Ambulation Distance (Feet): 2 Feet Assistive device: 2 person hand held assist Ambulation/Gait Assistance Details: side stepping couple of steps to head of bed in standing prior to return to sit    ADL: ADL Eating/Feeding: NPO Equipment Used: Gait belt Transfers/Ambulation Related to ADLs: Did not transfer or ambulate due to symptomatic orthostasis with sitting and standing ADL Comments: Pt with lethargy.  Requiring both physical and verbal cues for simple one step direction following.  Dependent in all aspects of ADL.  NPO pending swallowing assessment.  Cognition: Cognition Overall Cognitive Status: Impaired/Different from baseline Arousal/Alertness: Lethargic Orientation Level: Oriented to person;Disoriented to place;Disoriented to time;Disoriented to situation Attention: Focused Focused Attention: Impaired Focused Attention Impairment: Verbal basic;Functional basic Memory: Impaired Memory Impairment: Storage deficit (impacted by poor alertness and attention) Awareness: Impaired Awareness Impairment: Intellectual impairment Problem Solving: Impaired Problem Solving Impairment: Verbal  basic;Functional basic (bilateral hand mits required) Safety/Judgment: Impaired Cognition Arousal/Alertness: Lethargic Overall Cognitive Status:  Impaired/Different from baseline Area of Impairment: Attention;Safety/judgement Current Attention Level: Focused Following Commands: Follows one step commands inconsistently;Follows one step commands with increased time General Comments: wearing mitts to avoid pulling at lines  Blood pressure 145/59, pulse 78, temperature 100.5 F (38.1 C), temperature source Axillary, resp. rate 21, height 5\' 8"  (1.727 m), weight 83.7 kg (184 lb 8.4 oz), SpO2 98.00%. Physical Exam  Vitals reviewed. Constitutional: No distress.  HENT:  Head: Normocephalic.  Eyes:  Pupils sluggish to react to light  Neck: Neck supple. No thyromegaly present.  Cardiovascular: Normal rate and regular rhythm.   Pulmonary/Chest:  Decreased breath sounds but clear to auscultation  Abdominal: Bowel sounds are normal. He exhibits no distension.  Neurological:  Patient is lethargic but would arouse to name. He speaks mostly gibberish. He did not follow commands. Bilateral mittens are in place. Restless. Just received medication for pain/agitation---difficult to arouse  Skin: Skin is warm and dry.    Results for orders placed during the hospital encounter of 02/20/13 (from the past 24 hour(s))  GLUCOSE, CAPILLARY     Status: Abnormal   Collection Time    02/22/13  7:54 AM      Result Value Range   Glucose-Capillary 152 (*) 70 - 99 mg/dL  GLUCOSE, CAPILLARY     Status: Abnormal   Collection Time    02/22/13 12:23 PM      Result Value Range   Glucose-Capillary 134 (*) 70 - 99 mg/dL  GLUCOSE, CAPILLARY     Status: Abnormal   Collection Time    02/22/13  3:56 PM      Result Value Range   Glucose-Capillary 147 (*) 70 - 99 mg/dL   Comment 1 Notify RN     Comment 2 Documented in Chart    GLUCOSE, CAPILLARY     Status: Abnormal   Collection Time    02/22/13  7:48 PM       Result Value Range   Glucose-Capillary 110 (*) 70 - 99 mg/dL   Comment 1 Notify RN     Comment 2 Documented in Chart    GLUCOSE, CAPILLARY     Status: Abnormal   Collection Time    02/23/13 12:47 AM      Result Value Range   Glucose-Capillary 150 (*) 70 - 99 mg/dL  GLUCOSE, CAPILLARY     Status: Abnormal   Collection Time    02/23/13  3:53 AM      Result Value Range   Glucose-Capillary 127 (*) 70 - 99 mg/dL   Ct Head Wo Contrast  02/22/2013  *RADIOLOGY REPORT*  Clinical Data: Hemorrhagic stroke.  CT HEAD WITHOUT CONTRAST  Technique:  Contiguous axial images were obtained from the base of the skull through the vertex without contrast.  Comparison: 02/21/2013  Findings: Large left posterior parietal hemorrhage is again noted. This is similar in size to prior study.  This measures 5.0 x 5.0 cm.  When measured in the same planes previously, this measured 4.8 x 5.0 cm, likely not significantly changed. Intraventricular blood again noted within both occipital horns, stable.  Small amount of hemorrhage also again noted in the posterior right parietal region. Small amount of bilateral subarachnoid hemorrhage, stable.  Stable ventriculomegaly.  Stable slight left to right midline shift.  Chronic microvascular changes throughout the deep white matter.  No areas of new hemorrhage.  IMPRESSION: Stable exam.   Original Report Authenticated By: Charlett Nose, M.D.    Ct Head Wo Contrast  02/21/2013  *RADIOLOGY  REPORT*  Clinical Data: 72 year old male with acute infarct, TPA administration and intra-parenchymal hemorrhage.  CT HEAD WITHOUT CONTRAST  Technique:  Contiguous axial images were obtained from the base of the skull through the vertex without contrast.  Comparison: 02/20/2013 CTs  Findings: A left posterior parietal hemorrhage has increased in size, now measuring 4.5 x 5 cm, previously 3.8 x 3.9 cm. Increasing mass effect on the posterior left occipital horn noted. Now noted is hemorrhage within both  occipital horns and a small amount of hemorrhage within the posterior right parietal region. A small amount of subarachnoid hemorrhage within both sylvian fissures is now noted. 5 mm left to right midline shift is again noted. Mild ventriculomegaly is unchanged.  Remote bilateral cerebellar, occipital and parietal infarcts again noted.  IMPRESSION: Enlarging left posterior parietal hemorrhage (presently 4.5 x 5 cm, previously 3.8 x 3.9 cm) with increasing mass effect and unchanged 5 mm left right midline shift.  New intraventricular and subarachnoid hemorrhage as described above.  Critical Value/emergent results were called by telephone at the time of interpretation on 07/24/2013 at 10:45 a.m. to Mehgan, R.N., who verbally acknowledged these results.   Original Report Authenticated By: Harmon Pier, M.D.    Mr Brain Wo Contrast  02/22/2013  *RADIOLOGY REPORT*  Clinical Data:  Post t-PA hemorrhage.  Left brain stroke.  MRI HEAD WITHOUT CONTRAST MRA HEAD WITHOUT CONTRAST  Technique:  Multiplanar, multiecho pulse sequences of the brain and surrounding structures were obtained without intravenous contrast. Angiographic images of the head were obtained using MRA technique without contrast.  Comparison:  CT head 02/20/2013 through 02/22/13.  MRI brain 07/04/2010.  MRI HEAD  Findings:  Scattered areas of left MCA territory posterior frontal, parietal, cortical and subcortical acute infarction are separate from the area of hemorrhage. Their multiplicity suggests multiple emboli.  There is a large (68 x 52 x 42 mm, approx 70 ml) acute left parieto-occipital intracerebral hematoma with slight intraventricular extension. There is peripheral restricted diffusion.  There is mild vasogenic edema surrounding hematoma which extends to the surface of the brain.  There is compression of the left lateral ventricle.  No midline shift is evident. There is no visible intracranial mass lesion.  Compared with the prior MR from 2011, no  underlying vascular malformation in this region was identified.  Remote infarctions are seen in the left superior cerebellum, right mid cerebellum, and left occipital lobe.  Foci of chronic hemorrhage can be seen associated with right greater than left posterior temporal lobe infarcts as seen on gradient sequence image 14 series 8.  Moderate ventriculomegaly similar to priors, without evidence for obstructive hydrocephalus.  Atrophy and chronic microvascular ischemic change.  No osseous lesions.  Cervical spondylosis incompletely evaluated but appears fairly pronounced at C3-C4.  No tonsillar herniation. Mild chronic sinus disease.  IMPRESSION: 68 x 52 x 42 mm, approximate 70 ml intracerebral hemorrhage, with intraventricular extension. Multiple areas of left hemisphere restricted diffusion consistent with acute non hemorrhagic embolic infarction.  Findings consistent with post t-PA hemorrhage without visible underlying vascular malformation.  Areas of atrophy, small vessel disease, and chronic infarction elsewhere as described.  MRA HEAD  Findings: Internal carotid arteries widely patent.  The basilar artery is widely patent with vertebrals codominant.  There is no proximal intracranial stenosis or aneurysm.  No vascular malformation is evident.  Mild irregularity distal MCA and PCA branches suggests intracranial atherosclerotic change.  IMPRESSION: No proximal stenosis or vascular malformation.  No intracranial aneurysm is evident.   Original Report  Authenticated By: Davonna Belling, M.D.    Mr Mra Head/brain Wo Cm  02/22/2013  *RADIOLOGY REPORT*  Clinical Data:  Post t-PA hemorrhage.  Left brain stroke.  MRI HEAD WITHOUT CONTRAST MRA HEAD WITHOUT CONTRAST  Technique:  Multiplanar, multiecho pulse sequences of the brain and surrounding structures were obtained without intravenous contrast. Angiographic images of the head were obtained using MRA technique without contrast.  Comparison:  CT head 02/20/2013 through  02/22/13.  MRI brain 07/04/2010.  MRI HEAD  Findings:  Scattered areas of left MCA territory posterior frontal, parietal, cortical and subcortical acute infarction are separate from the area of hemorrhage. Their multiplicity suggests multiple emboli.  There is a large (68 x 52 x 42 mm, approx 70 ml) acute left parieto-occipital intracerebral hematoma with slight intraventricular extension. There is peripheral restricted diffusion.  There is mild vasogenic edema surrounding hematoma which extends to the surface of the brain.  There is compression of the left lateral ventricle.  No midline shift is evident. There is no visible intracranial mass lesion.  Compared with the prior MR from 2011, no underlying vascular malformation in this region was identified.  Remote infarctions are seen in the left superior cerebellum, right mid cerebellum, and left occipital lobe.  Foci of chronic hemorrhage can be seen associated with right greater than left posterior temporal lobe infarcts as seen on gradient sequence image 14 series 8.  Moderate ventriculomegaly similar to priors, without evidence for obstructive hydrocephalus.  Atrophy and chronic microvascular ischemic change.  No osseous lesions.  Cervical spondylosis incompletely evaluated but appears fairly pronounced at C3-C4.  No tonsillar herniation. Mild chronic sinus disease.  IMPRESSION: 68 x 52 x 42 mm, approximate 70 ml intracerebral hemorrhage, with intraventricular extension. Multiple areas of left hemisphere restricted diffusion consistent with acute non hemorrhagic embolic infarction.  Findings consistent with post t-PA hemorrhage without visible underlying vascular malformation.  Areas of atrophy, small vessel disease, and chronic infarction elsewhere as described.  MRA HEAD  Findings: Internal carotid arteries widely patent.  The basilar artery is widely patent with vertebrals codominant.  There is no proximal intracranial stenosis or aneurysm.  No vascular  malformation is evident.  Mild irregularity distal MCA and PCA branches suggests intracranial atherosclerotic change.  IMPRESSION: No proximal stenosis or vascular malformation.  No intracranial aneurysm is evident.   Original Report Authenticated By: Davonna Belling, M.D.     Assessment/Plan: Diagnosis: left parietal parenchymal hemorrhage with some extension to the subarachnoid space/ scattered left MCA embolic infarcts  1. Does the need for close, 24 hr/day medical supervision in concert with the patient's rehab needs make it unreasonable for this patient to be served in a less intensive setting? Yes and Potentially 2. Co-Morbidities requiring supervision/potential complications: dm, htn, 3. Due to bladder management, bowel management, safety, skin/wound care, disease management, medication administration, pain management and patient education, does the patient require 24 hr/day rehab nursing? Yes 4. Does the patient require coordinated care of a physician, rehab nurse, PT (1-2 hrs/day, 5 days/week), OT (1-2 hrs/day, 5 days/week) and SLP (1-2 hrs/day, \5 days/week) to address physical and functional deficits in the context of the above medical diagnosis(es)? Potentially Addressing deficits in the following areas: balance, endurance, locomotion, strength, transferring, bowel/bladder control, bathing, dressing, feeding, grooming, toileting, cognition, speech, language, swallowing and psychosocial support 5. Can the patient actively participate in an intensive therapy program of at least 3 hrs of therapy per day at least 5 days per week? Potentially 6. The potential for patient to  make measurable gains while on inpatient rehab is good 7. Anticipated functional outcomes upon discharge from inpatient rehab are min to mod assist with PT, mod assist with OT, min to mod assist with SLP. 8. Estimated rehab length of stay to reach the above functional goals is: 2-4 weeks 9. Does the patient have adequate social  supports to accommodate these discharge functional goals? Potentially 10. Anticipated D/C setting: Home 11. Anticipated post D/C treatments: HH therapy 12. Overall Rehab/Functional Prognosis: excellent  RECOMMENDATIONS: This patient's condition is appropriate for continued rehabilitative care in the following setting: CIR Patient has agreed to participate in recommended program. n/a Note that insurance prior authorization may be required for reimbursement for recommended care.  Comment: Spoke with wife and family. Pt was sedentary but independent PTA. Will follow for medical and functional progress. Discussed potential goals with family.   Ranelle Oyster, MD, [place]     02/23/2013

## 2013-02-24 LAB — CBC
HCT: 28.5 % — ABNORMAL LOW (ref 39.0–52.0)
Hemoglobin: 10 g/dL — ABNORMAL LOW (ref 13.0–17.0)
MCH: 29.8 pg (ref 26.0–34.0)
MCHC: 35.1 g/dL (ref 30.0–36.0)
MCV: 84.8 fL (ref 78.0–100.0)

## 2013-02-24 LAB — BASIC METABOLIC PANEL
GFR calc non Af Amer: 26 mL/min — ABNORMAL LOW (ref >90)
Glucose, Bld: 145 mg/dL — ABNORMAL HIGH (ref 70–99)
Potassium: 3.9 mEq/L (ref 3.5–5.1)
Sodium: 141 mEq/L (ref 135–145)

## 2013-02-24 LAB — GLUCOSE, CAPILLARY: Glucose-Capillary: 200 mg/dL — ABNORMAL HIGH (ref 70–99)

## 2013-02-24 MED ORDER — ENOXAPARIN SODIUM 30 MG/0.3ML ~~LOC~~ SOLN
30.0000 mg | SUBCUTANEOUS | Status: DC
  Administered 2013-02-24 – 2013-02-26 (×3): 30 mg via SUBCUTANEOUS
  Filled 2013-02-24 (×4): qty 0.3

## 2013-02-24 MED ORDER — INSULIN GLARGINE 100 UNIT/ML ~~LOC~~ SOLN
25.0000 [IU] | Freq: Every day | SUBCUTANEOUS | Status: DC
  Administered 2013-02-24 – 2013-02-25 (×2): 25 [IU] via SUBCUTANEOUS
  Filled 2013-02-24 (×4): qty 0.25

## 2013-02-24 MED ORDER — ENSURE COMPLETE PO LIQD
237.0000 mL | Freq: Two times a day (BID) | ORAL | Status: DC
  Administered 2013-02-24 – 2013-02-26 (×4): 237 mL via ORAL

## 2013-02-24 NOTE — Progress Notes (Signed)
Occupational Therapy Treatment Patient Details Name: Brady Frazier MRN: [id number] DOB: [birth year] Today's Date: 02/24/2013 Time: 7829-5621 OT Time Calculation (min): 55 min  OT Assessment / Plan / Recommendation Comments on Treatment Session Pts family wants SNF- [facility]. This has been communicated with SW    Follow Up Recommendations  SNF;Other (comment) (family want [facility])                Plan Discharge plan needs to be updated    Precautions / Restrictions Precautions Precautions: Fall       ADL  Grooming: Performed;Wash/dry hands;Wash/dry face;Maximal assistance;Other (comment) (Pt perseverated on washing hands.) Where Assessed - Grooming: Supine, head of bed up Transfers/Ambulation Related to ADLs: OT focused on providing simple 1 step commands with grooming activity.  Pt did take washcloth, but did not bring to face or eyes as instructed by OT. Pt given extra time, but did not follow any commands related to grooming task ADL Comments: Pt did follow 3 1 step commands during BUE AROM  in which he did lift Left and Right arms and hold as instructed by OT.  Pt did need extra time to perform and was inconsistent.  Much of session focused on keeping eyes open. Pt did seem to see OT when therapist was in pts L visual field.      OT Goals ADL Goals ADL Goal: Grooming - Progress: Progressing toward goals Miscellaneous OT Goals OT Goal: Miscellaneous Goal #3 - Progress: Progressing toward goals OT Goal: Miscellaneous Goal #4 - Progress: Progressing toward goals (Pt did participate with OT with BUe AAROM. Max VC needed )  Visit Information  Assistance Needed: +2    Subjective Data  Subjective: well ok      Cognition  Cognition Arousal/Alertness: Lethargic Area of Impairment: Following commands Following Commands: Follows one step commands inconsistently       Exercises  General Exercises - Upper Extremity Shoulder Flexion: AAROM;Both;10 reps Elbow Flexion:  AAROM;Both;10 reps Elbow Extension: AAROM;Both;10 reps Wrist Flexion: AAROM;Both;10 reps Wrist Extension: AAROM;Both;15 reps Digit Composite Flexion: AAROM;10 reps Other Exercises Other Exercises:  reinforced stimulation on pt's right side due to left gaze.      End of Session OT - End of Session Activity Tolerance: Patient limited by fatigue Patient left: in bed;with call bell/phone within reach;with family/visitor present       Cris Talavera, [place] 02/24/2013, 10:56 AM

## 2013-02-24 NOTE — Progress Notes (Signed)
Physical Therapy Treatment Patient Details Name: Brady Frazier MRN: [id number] DOB: 20-May-1941 Today's Date: 02/24/2013 Time: 1478-2956 PT Time Calculation (min): 25 min  PT Assessment / Plan / Recommendation Comments on Treatment Session  Pt making progress with mobility at this date.  Ambulated ~15' with RW.  Requires max step-by-step cues for all tasks.  Pt very slow to process, initiate, & execute tasks.      Follow Up Recommendations  CIR     Does the patient have the potential to tolerate intense rehabilitation     Barriers to Discharge        Equipment Recommendations       Recommendations for Other Services    Frequency Min 4X/week   Plan Discharge plan remains appropriate    Precautions / Restrictions Precautions Precautions: Fall Restrictions Weight Bearing Restrictions: No       Mobility  Bed Mobility Bed Mobility: Rolling Left;Left Sidelying to Sit;Sitting - Scoot to Edge of Bed Rolling Left: 1: +1 Total assist Left Sidelying to Sit: 1: +2 Total assist;HOB elevated Left Sidelying to Sit: Patient Percentage: 30% Sitting - Scoot to Edge of Bed: 1: +1 Total assist Details for Bed Mobility Assistance: Max directional cues for initiation, sequencing, & technique.  (A) for LE's & to lift shoulders/trunk to sitting upright.   Transfers Transfers: Sit to Stand;Stand to Sit Sit to Stand: 3: Mod assist;From bed;With upper extremity assist Stand to Sit: 3: Mod assist;With upper extremity assist;With armrests;To chair/3-in-1 Details for Transfer Assistance: Max directional cues for hand placement & technique.  Pt requires multimodal cues.  Very slow to process, initiate, & carryout.  Ambulation/Gait Ambulation/Gait Assistance: 3: Mod assist (+2 to follow with chair & equipment) Ambulation Distance (Feet): 15 Feet Assistive device: Rolling Dolin Ambulation/Gait Assistance Details: Max cues for RW advancement, sequencing,  LE advancement.  Pt able to take very small  shuffling steps forwards by himself but requires manual (A) to increase floor clearance & step length bil LE's.  (A) to manage RW.   Gait Pattern: Decreased step length - right;Decreased step length - left;Decreased weight shift to right;Decreased weight shift to left;Decreased hip/knee flexion - left;Decreased hip/knee flexion - right;Decreased dorsiflexion - right;Decreased dorsiflexion - left;Shuffle;Decreased trunk rotation Stairs: No Wheelchair Mobility Wheelchair Mobility: No          PT Goals Acute Rehab PT Goals Time For Goal Achievement: 03/08/13 Potential to Achieve Goals: Good Pt will go Supine/Side to Sit: with supervision PT Goal: Supine/Side to Sit - Progress: Not met Pt will go Sit to Supine/Side: with supervision Pt will go Sit to Stand: with min assist PT Goal: Sit to Stand - Progress: Progressing toward goal Pt will go Stand to Sit: with supervision PT Goal: Stand to Sit - Progress: Progressing toward goal Pt will Transfer Bed to Chair/Chair to Bed: with min assist Pt will Stand: with supervision;with unilateral upper extremity support;3 - 5 min PT Goal: Stand - Progress: Progressing toward goal Pt will Ambulate: 51 - 150 feet;with least restrictive assistive device;with min assist PT Goal: Ambulate - Progress: Progressing toward goal  Visit Information  Last PT Received On: 02/24/13 Assistance Needed: +2    Subjective Data      Cognition  Cognition Arousal/Alertness: Lethargic Behavior During Therapy: Flat affect Overall Cognitive Status: Impaired/Different from baseline Area of Impairment: Attention;Following commands;Problem solving;Orientation Current Attention Level: Focused Following Commands: Follows one step commands inconsistently Problem Solving: Slow processing;Decreased initiation;Difficulty sequencing;Requires verbal cues;Requires tactile cues    Balance  End of Session PT - End of Session Equipment Utilized During Treatment: Gait  belt Activity Tolerance: Patient tolerated treatment well Patient left: in chair;with call bell/phone within reach;with family/visitor present Nurse Communication: Mobility status     Verdell Face, [place] [phone] 02/24/2013

## 2013-02-24 NOTE — Procedures (Signed)
EEG NUMBER:  REFERRING PHYSICIAN:  Dr. Pearlean Brownie.  HISTORY:  A 72 year old male, presenting with dysarthria and right hemiparesis.  MEDICATIONS:  NovoLog, Cardene, Tylenol, Protonix.  CONDITIONS OF RECORDING:  This is a 16-channel EEG carried out with the patient in the lethargic state.  DESCRIPTION:  The background activity is marred by artifact.  There is noted underlying background activity that consists mostly of a polymorphic delta rhythm.  This activity is continuous.  No epileptiform activity is noted.  Hyperventilation and intermittent photic stimulation were not performed.  IMPRESSION:  This is an EEG that is characterized by general background slowing.  Although this may be seen in drowse cannot rule out the possibility of a generalized diffuse disturbance that is etiologically nonspecific, and may include a metabolic encephalopathy among other possibilities.  No epileptiform activity was noted.          ______________________________ Thana Farr, MD    VH:QION D:  02/23/2013 07:54:35  T:  02/24/2013 62:95:28  Job #:  [id number]

## 2013-02-24 NOTE — Progress Notes (Signed)
Stroke Team Progress Note  HISTORY Brady Frazier is a 72 y.o. male who was well on the morning of 02/20/13 per family. Patient was watching television with his wife when she noted that he was drooling. Wife then began to ask questions of the patient and his answers were not accurate, speech was slurred. Patient is at baseline ambulatory and walking with a limp but was unable to ambulate at this time as well. Patient was brought to [facility] ED at that time for further evaluation where he was evaluated by Teleneurology services. tPA was administered and patient was transferred to [facility] for further care. Patient's pre-tPA NIHSS was 7. Upon arrival at [facility] NIH score was 5.  SUBJECTIVE Patient sitting up eating breakfast. Son at bedside. Patient more awake, not speaking.  OBJECTIVE Most recent Vital Signs: Filed Vitals:   02/24/13 0200 02/24/13 0400 02/24/13 0430 02/24/13 0600  BP: 155/76 172/76 154/77 158/69  Pulse: 61 57 58 58  Temp:      TempSrc:      Resp: 22 19 18 16   Height:      Weight:      SpO2: 97% 99% 99% 99%   CBG (last 3)   Recent Labs  02/23/13 1201 02/23/13 1615 02/23/13 1949  GLUCAP 112* 126* 123*   IV Fluid Intake:   . sodium chloride 75 mL/hr at 02/24/13 0600   MEDICATIONS  . amLODipine  5 mg Oral Daily  . atorvastatin  20 mg Oral q1800  . enoxaparin (LOVENOX) injection  40 mg Subcutaneous Q24H  . hydrochlorothiazide  25 mg Oral Daily  . insulin aspart  0-15 Units Subcutaneous TID WC  . LORazepam  1 mg Intravenous Once   PRN:  acetaminophen, acetaminophen, hydrALAZINE, labetalol, morphine injection, ondansetron (ZOFRAN) IV, senna-docusate  Diet:  Dysphagia Activity:  Bedrest  DVT Prophylaxis:  SCDs  CLINICALLY SIGNIFICANT STUDIES Basic Metabolic Panel:   Recent Labs Lab 02/20/13 1154 02/24/13 0404  NA 136 141  K 4.8 3.9  CL 103 112  CO2 26 22  GLUCOSE 244* 145*  BUN 19 25*  CREATININE 2.19* 2.36*  CALCIUM 8.7 8.2*   Liver Function Tests:   Recent  Labs Lab 02/20/13 1154  AST 17  ALT 14  ALKPHOS 71  BILITOT 0.4  PROT 6.9  ALBUMIN 3.0*   CBC:   Recent Labs Lab 02/20/13 1154 02/24/13 0404  WBC 7.2 12.2*  HGB 11.3* 10.0*  HCT 31.4* 28.5*  MCV 84.9 84.8  PLT 214 198   Coagulation:   Recent Labs Lab 02/20/13 1154  LABPROT 13.8  INR 1.07   Cardiac Enzymes:   Recent Labs Lab 02/20/13 1154  TROPONINI <0.30   Urinalysis:   Recent Labs Lab 02/20/13 1506  COLORURINE YELLOW  LABSPEC 1.011  PHURINE 6.5  GLUCOSEU 100*  HGBUR TRACE*  BILIRUBINUR NEGATIVE  KETONESUR NEGATIVE  PROTEINUR >300*  UROBILINOGEN 1.0  NITRITE NEGATIVE  LEUKOCYTESUR NEGATIVE   Lipid Panel    Component Value Date/Time   CHOL 152 02/21/2013 0330   TRIG 56 02/21/2013 0330   HDL 62 02/21/2013 0330   CHOLHDL 2.5 02/21/2013 0330   VLDL 11 02/21/2013 0330   LDLCALC 79 02/21/2013 0330   HgbA1C  Lab Results  Component Value Date   HGBA1C 5.7* 02/21/2013   Urine Drug Screen:   No results found for this basename: labopia,  cocainscrnur,  labbenz,  amphetmu,  thcu,  labbarb    Alcohol Level: No results found for this basename: ETH,  in  the last 168 hours  Ct Head Wo Contrast 02/20/2013 18:37  1.  New left parietal parenchymal hemorrhage with some extension into the subarachnoid space in the posterior left sylvian fissure and tentorium. 2.  Stable remote infarcts. 02/20/2013 12:16  1.  Remote left superior cerebellar infarct. 2.  Stable additional bilateral remote infarcts of the cerebellum, posterior parietal and occipital lobes bilaterally.  Ischemic changes are predominately in the posterior circulation. 3.  Stable atrophy and white matter disease. 4.  Stable ventricular dilation, proportionate to the atrophy and with ex vacuo dilation associated with prior infarcts.      Chest Xray 02/20/2013   1.  Low lung volumes and minimal bibasilar atelectasis.  2.  No acute cardiopulmonary disease.     MRI of the brain 02/22/2013  68 x 52 x 42 mm,  approximate 70 ml intracerebral hemorrhage, with intraventricular extension. Multiple areas of left hemisphere restricted diffusion consistent with acute non hemorrhagic embolic infarction.  Findings consistent with post t-PA hemorrhage without visible underlying vascular malformation.  Areas of atrophy, small vessel disease, and chronic infarction elsewhere   MRA of the brain 02/22/2013   No proximal stenosis or vascular malformation.  No intracranial aneurysm is evident.    2D Echocardiogram  EF 60-65% with no source of embolus.   Carotid Doppler No evidence of hemodynamically significant internal carotid artery stenosis. Vertebral artery flow is antegrade.   EKG  SR rate 64 BPM  EEG   TEE  EEG No epileptiform activity was noted.   Therapy Recommendations  CIR  Physical Exam   Elderly african american male  Not in distress.  Afebrile. Head is nontraumatic. Neck is supple without bruit. Cardiac exam no murmur or gallop. Lungs are clear to auscultation. Distal pulses are well felt.  NEUROLOGIC:  MENTAL STATUS: Alert. Speech nonfluent and few words only,  follows simple commands.  CRANIAL NERVES: pupils equal and reactive to light, left gaze preference. Able to look to right upto midline only. Decrease blink to threat on right side.,right lower face weakness. uvula midlinec, tongue midline MOTOR: normal bulk and tone, Strength  Mildly diminished on right but has antigravity movements  Left more than right- SENSORY: normal and symmetric to light touch  COORDINATION: finger-nose-finger and  - heel to shin unable to test REFLEXES: deep tendon reflexes present and symmetric - no babinski  ASSESSMENT Brady Frazier is a 72 y.o. male presenting with dysarthria and mild right hemiparesis. Status post IV t-PA  at 13:15 on 02/20/13 at [facility], transferred to [facility] for care. patient with post tpa symptomatic left parietal parenchymal hemorrhage with extension into the subarachnoid space. 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 Infarcts felt to be secondary to unknown etiology.  On no antiplatelets prior to admission. Now on no antiplatelets secondary to post tPA hemorrhage. Patient with resultant  right hemiparesis, global aphasia, dysphagia. Work up underway.   DM - HEMOGLOBIN A1c 5.7  Hypertension  Renal Disease  Previous strokes  Anemia.  Hospital day # 4  TREATMENT/PLAN  Risk factor modification. Strict Control hypertension. Begin home BP medications  TEE to look for embolic source. Arranged with [facility] for Thursday.  If positive for PFO (patent foramen ovale), check bilateral lower extremity venous dopplers to rule out DVT as possible source of stroke  Transfer out of ICU   Larita Fife D. Manson Passey, [facility], MBA, MHA [facility] Pager: [phone] 02/24/2013 7:42 AM  I have personally obtained a history, examined the patient, evaluated  imaging results, and formulated the assessment and plan of care. I agree with the above.  Delia Heady

## 2013-02-24 NOTE — Clinical Social Work Note (Signed)
Clinical Social Work Department CLINICAL SOCIAL WORK PLACEMENT NOTE 02/24/2013  Patient:  Brady Frazier, Brady Frazier  Account Number:  [id number] Admit date:  02/20/2013  Clinical Social Worker:  Macario Golds, LCSW  Date/time:  02/24/2013 09:30 AM  Clinical Social Work is seeking post-discharge placement for this patient at the following level of care:   SKILLED NURSING   (*CSW will update this form in Epic as items are completed)   02/24/2013  Patient/family provided with [facility] Department of Clinical Social Work's list of facilities offering this level of care within the geographic area requested by the patient (or if unable, by the patient's family).  02/24/2013  Patient/family informed of their freedom to choose among providers that offer the needed level of care, that participate in Medicare, Medicaid or managed care program needed by the patient, have an available bed and are willing to accept the patient.  02/24/2013  Patient/family informed of [facility]' ownership interest in [facility], as well as of the fact that they are under no obligation to receive care at this facility.  PASARR submitted to EDS on 02/24/2013 PASARR number received from EDS on 02/24/2013  FL2 transmitted to all facilities in geographic area requested by pt/family on  02/24/2013 FL2 transmitted to all facilities within larger geographic area on   Patient informed that his/her managed care company has contracts with or will negotiate with  certain facilities, including the following:     Patient/family informed of bed offers received:   Patient chooses bed at  Physician recommends and patient chooses bed at    Patient to be transferred to  on   Patient to be transferred to facility by   The following physician request were entered in Epic:   Additional Comments: 05/14 - Patient family preference is for [facility]

## 2013-02-24 NOTE — Progress Notes (Signed)
INITIAL NUTRITION ASSESSMENT  DOCUMENTATION CODES Per approved criteria  -Not Applicable   INTERVENTION: Ensure Complete po BID, each supplement provides 350 kcal and 13 grams of protein.  NUTRITION DIAGNOSIS: Inadequate oral intake related to confusion/dypshagia as evidenced by meal completion < 25%.   Goal: Pt to meet >/= 90% of their estimated nutrition needs.   Monitor:  PO intake, supplement acceptance, weight trend  Reason for Assessment: RN Consult   72 y.o. male  Admitting Dx: Acute ischemic left middle cerebral artery (MCA) stroke  ASSESSMENT: Pt admitted for stroke, work up underway. Pt discussed during ICU rounds and with RN. Per RN pt does not eat well but does drink well. Pt has been on Dysphagia I diet with Thin liquids. Pt was seen by SLP this am and upgraded to [facility] Modified diet. Family in room and report no weight loss PTA. Per weight hx pt with 4% weight loss x 5 months.   Height: Ht Readings from Last 1 Encounters:  02/20/13 5\' 8"  (1.727 m)    Weight: Wt Readings from Last 1 Encounters:  02/23/13 184 lb 8.4 oz (83.7 kg)    Ideal Body Weight: 70 kg  % Ideal Body Weight: 120%  Wt Readings from Last 10 Encounters:  02/23/13 184 lb 8.4 oz (83.7 kg)  10/01/12 191 lb 6.4 oz (86.818 kg)    Usual Body Weight: 191 lb   % Usual Body Weight: 96%  BMI:  Body mass index is 28.06 kg/(m^2).  Estimated Nutritional Needs: Kcal: 1700-1900 Protein: 80-90 grams Fluid: > 1.7 L/day  Skin: no issues noted  Diet Order: Carb Control  EDUCATION NEEDS: -No education needs identified at this time   Intake/Output Summary (Last 24 hours) at 02/24/13 1136 Last data filed at 02/24/13 1100  Gross per 24 hour  Intake 1591.25 ml  Output   1775 ml  Net -183.75 ml    Last BM: 5/9   Labs:   Recent Labs Lab 02/20/13 1154 02/24/13 0404  NA 136 141  K 4.8 3.9  CL 103 112  CO2 26 22  BUN 19 25*  CREATININE 2.19* 2.36*  CALCIUM 8.7 8.2*  GLUCOSE 244*  145*    CBG (last 3)   Recent Labs  02/23/13 1615 02/23/13 1949 02/24/13 0758  GLUCAP 126* 123* 128*    Scheduled Meds: . amLODipine  5 mg Oral Daily  . atorvastatin  20 mg Oral q1800  . enoxaparin (LOVENOX) injection  30 mg Subcutaneous Q24H  . hydrochlorothiazide  25 mg Oral Daily  . insulin aspart  0-15 Units Subcutaneous TID WC  . LORazepam  1 mg Intravenous Once    Continuous Infusions: . sodium chloride 20 mL/hr at 02/24/13 1100    Past Medical History  Diagnosis Date  . Diabetes mellitus   . Hypertension   . Stroke   . Kidney disease     Past Surgical History  Procedure Laterality Date  . Stomach surgery      Kendell Bane RD, LDN, CNSC [phone] Pager [phone] After Hours Pager

## 2013-02-24 NOTE — Clinical Social Work Note (Signed)
Clinical Social Work Department BRIEF PSYCHOSOCIAL ASSESSMENT 02/24/2013  Patient:  Brady Frazier, Brady Frazier     Account Number:  [id number]     Admit date:  02/20/2013  Clinical Social Worker:  Verl Blalock  Date/Time:  02/24/2013 09:50 AM  Referred by:  RN  Date Referred:  02/24/2013 Referred for  SNF Placement   Other Referral:   Interview type:  Patient Other interview type:   Patient son and wife at bedside    PSYCHOSOCIAL DATA Living Status:  WIFE Admitted from facility:   Level of care:   Primary support name:  Jacobus, Colvin  [phone] Primary support relationship to patient:  SPOUSE Degree of support available:   Strong    CURRENT CONCERNS Current Concerns  Post-Acute Placement   Other Concerns:   Patient wife able to provide emotional support and assistance, however unable to provide any physical support to patient at discharge.    SOCIAL WORK ASSESSMENT / PLAN Clinical Social Worker met with patient, patient wife, and patient son at bedside to offer support and discuss patient needs at discharge.  Patient son states that patient lives at home with his wife in [place] and ultimately would like to return home with her once physically able.  Patient son agreeable to inpatient rehab, however would like to pursue [facility] as first option for convenience to patient wife.  Patient family agreeable to initiate search in [place] - CSW to complete FL2 and follow up with patient family regarding bed offer.  CSW to submit [name] clinicals once patient has been approved for placement. CSW remains available for support and to facilitate patient discharge needs once medically stable.   Assessment/plan status:  Psychosocial Support/Ongoing Assessment of Needs Other assessment/ plan:   Information/referral to community resources:   Visual merchandiser provided patient family with facility list to begin search for second option.    PATIENT'S/FAMILY'S RESPONSE TO PLAN OF  CARE: Patient not alert and oriented at this time - patient family very supportive at bedside and requested social work involvement.  Patient and family live in [place] area and would like patient to be placed close to home.  Patient family hopeful for [facility].  Patient family expressed their appreciation for CSW involvement and support.    Macario Golds, [place] [phone]

## 2013-02-24 NOTE — Progress Notes (Signed)
Speech Language Pathology Dysphagia and Cognitive-Linguistic Treatment Patient Details Name: Brady Frazier MRN: [id number] DOB: 1941-10-11 Today's Date: 02/24/2013 Time: 1040-1105 SLP Time Calculation (min): 25 min  Assessment / Plan / Recommendation Clinical Impression  Patient able to self feed clinician provided diagnostic po trials of cracker and thin liquid with initial max verbal, visual, and tactile cueing for initiation of task, fading to min verbal cues by end of treatment. No overt indication of aspiration with these diet textures. Cough noted post po intake not appearing related to swallowing function at this time however will continue to f/u for tolerance briefly as SLP recommends upgrade to a regular diet, thin liquid. Patient with sustained attention to basic task for 1-2 minutes, again with mod-max cues intially, fading to min after task initiated which seems to be largest barrier to both communication and efficient carryout of ADLs today. SLP will continue to f/u for both dysphagia and cognitive-linguistic treatment.     Diet Recommendation  Initiate / Change Diet: Regular;Thin liquid    SLP Plan Continue with current plan of care   Pertinent Vitals/Pain None observed   Swallowing Goals  SLP Swallowing Goals Patient will utilize recommended strategies during swallow to increase swallowing safety with: Maximum assistance Swallow Study Goal #2 - Progress: Progressing toward goal  General Temperature Spikes Noted: Yes Respiratory Status: Room air Behavior/Cognition: Alert;Requires cueing;Distractible;Decreased sustained attention Oral Cavity - Dentition: Adequate natural dentition Patient Positioning: Upright in chair      Dysphagia Treatment Treatment focused on: Skilled observation of diet tolerance;Upgraded PO texture trials;Utilization of compensatory strategies;Patient/family/caregiver education (cognition, language) Family/Caregiver Educated: spouse Treatment  Methods/Modalities: Skilled observation;Differential diagnosis Patient observed directly with PO's: Yes Type of PO's observed: Dysphagia 3 (soft);Thin liquids Feeding: Able to feed self Liquids provided via: Cup;Straw Type of cueing: Verbal;Tactile;Visual (for initiation of task and follow through only)   GO   Ferdinand Lango MA, CCC-SLP [phone]   Keron Koffman Meryl 02/24/2013, 12:08 PM

## 2013-02-25 ENCOUNTER — Encounter (HOSPITAL_COMMUNITY)
Admission: EM | Disposition: A | Discharge status: Skilled Nursing Facility | Payer: Self-pay | Source: Home / Self Care | Attending: Neurology

## 2013-02-25 ENCOUNTER — Encounter (HOSPITAL_COMMUNITY): Payer: Self-pay | Admitting: *Deleted

## 2013-02-25 DIAGNOSIS — I6789 Other cerebrovascular disease: Secondary | ICD-10-CM

## 2013-02-25 HISTORY — PX: TEE WITHOUT CARDIOVERSION: SHX5443

## 2013-02-25 LAB — GLUCOSE, CAPILLARY: Glucose-Capillary: 163 mg/dL — ABNORMAL HIGH (ref 70–99)

## 2013-02-25 SURGERY — ECHOCARDIOGRAM, TRANSESOPHAGEAL
Anesthesia: Moderate Sedation

## 2013-02-25 MED ORDER — FENTANYL CITRATE 0.05 MG/ML IJ SOLN
INTRAMUSCULAR | Status: AC
  Filled 2013-02-25: qty 2

## 2013-02-25 MED ORDER — MIDAZOLAM HCL 10 MG/2ML IJ SOLN
INTRAMUSCULAR | Status: DC | PRN
  Administered 2013-02-25: 1 mg via INTRAVENOUS
  Administered 2013-02-25: 2 mg via INTRAVENOUS

## 2013-02-25 MED ORDER — MIDAZOLAM HCL 5 MG/ML IJ SOLN
INTRAMUSCULAR | Status: AC
  Filled 2013-02-25: qty 2

## 2013-02-25 MED ORDER — BUTAMBEN-TETRACAINE-BENZOCAINE 2-2-14 % EX AERO
INHALATION_SPRAY | CUTANEOUS | Status: DC | PRN
  Administered 2013-02-25: 2 via TOPICAL

## 2013-02-25 MED ORDER — SODIUM CHLORIDE 0.9 % IV SOLN: INTRAVENOUS | Status: DC

## 2013-02-25 MED ORDER — FENTANYL CITRATE 0.05 MG/ML IJ SOLN
INTRAMUSCULAR | Status: DC | PRN
  Administered 2013-02-25: 25 ug via INTRAVENOUS

## 2013-02-25 NOTE — Progress Notes (Signed)
Speech Language Pathology Treatment Patient Details Name: Brady Frazier MRN: [id number] DOB: 1941/07/28 Today's Date: 02/25/2013 Time: 1420-1500 SLP Time Calculation (min): 40 min  Assessment / Plan / Recommendation Clinical Impression  Pt. with difficulty attending to feeding with family in room and talking.  Redirected with moderate verbal and tactile cues.  Pt. stuggles with hand-eye coordination and visual perception, with difficulty locating plate and food on plate, then getting food to mouth.  Hand over hand assist required.  Verbally, pt, is communicating his needs approximately 50% of the time.  Affect is flat, and initiation impaired, but pt. is begining to verbalize more (i.e." Let go of that." "Don't won't any more.")    SLP Plan  Continue with current plan of care    Pertinent Vitals/Pain n/a  SLP Goals  SLP Goals SLP Goal #1 - Progress: Progressing toward goal (Distractions in room (family talking)) SLP Goal #2 - Progress: Not met SLP Goal #3 - Progress: Partly met  General Temperature Spikes Noted: Yes (low grade temp) Respiratory Status: Room air Behavior/Cognition: Alert;Distractible;Requires cueing;Decreased sustained attention Oral Cavity - Dentition: Adequate natural dentition Patient Positioning: Upright in bed  Oral Cavity - Oral Hygiene Does patient have any of the following "at risk" factors?: Nutritional status - dependent feeder Brush patient's teeth BID with toothbrush (using toothpaste with fluoride): Yes Patient is HIGH RISK - Oral Care Protocol followed (see row info): Yes Patient is AT RISK - Oral Care Protocol followed (see row info): Yes   Treatment Treatment focused on: Cognition Skilled Treatment: Compensatory techniques for right neglect; attention to functional tasks (self-feeding)   GO     Maryjo Rochester T 02/25/2013, 3:09 PM

## 2013-02-25 NOTE — Progress Notes (Signed)
VASCULAR LAB PRELIMINARY  PRELIMINARY  PRELIMINARY  PRELIMINARY  Bilateral lower extremity venous duplex  completed.    Preliminary report:  Bilateral:  No evidence of DVT, superficial thrombosis, or Baker's Cyst.    Crit Obremski, RVT 02/25/2013, 4:23 PM

## 2013-02-25 NOTE — Progress Notes (Signed)
  Echocardiogram Echocardiogram Transesophageal has been performed.  Brady Frazier 02/25/2013, 12:30 PM

## 2013-02-25 NOTE — Interval H&P Note (Signed)
History and Physical Interval Note:  02/25/2013 11:27 AM  Brady Frazier  has presented today for surgery, with the diagnosis of stroke  The various methods of treatment have been discussed with the patient and family. After consideration of risks, benefits and other options for treatment, the patient has consented to  Procedure(s) with comments: TRANSESOPHAGEAL ECHOCARDIOGRAM (TEE) (N/A) - Rosann Auerbach /ja as a surgical intervention .  The patient's history has been reviewed, patient examined, no change in status, stable for surgery.  I have reviewed the patient's chart and labs.  Questions were answered to the patient's satisfaction.     Olga Millers

## 2013-02-25 NOTE — Progress Notes (Signed)
  Echocardiogram Echocardiogram Transesophageal has been performed.  Brady Frazier 02/25/2013, 12:29 PM

## 2013-02-25 NOTE — Clinical Social Work Note (Signed)
CSW submitted information to [place] at [place] and left message to confirm receipt.  CSW awaiting a return call.  CSW also contacted [facility], SNF - pt choice to inquire if [employer] auth has been started on their end and if they are in need of any additional information from [place].  CSW also requested a confirmation that a bed is available.  CSW awaiting a return call.  Vickii Penna, LCSWA [phone]  Clinical Social Work

## 2013-02-25 NOTE — Progress Notes (Signed)
Speech Language Pathology Dysphagia Treatment Patient Details Name: Brady Frazier MRN: [id number] DOB: [birth year] Today's Date: 02/25/2013 Time: 1350-1420 SLP Time Calculation (min): 30 min  Assessment / Plan / Recommendation Clinical Impression  Pt. required max assist to feed self with right hand.  Has difficulty seeing plate.  Takes large bites, or misses food altogether.  Mild pocketing noted on right, requires cues to clear.  Prolonged oral phase with regular consistencies.  No overt coughing/choking noted.    Diet Recommendation  Continue with Current Diet: Regular;Thin liquid Initiate / Change Diet: Dysphagia 3 (mechanical soft);Thin liquid    SLP Plan Continue with current plan of care   Pertinent Vitals/Pain n/a   Swallowing Goals  SLP Swallowing Goals Patient will consume recommended diet without observed clinical signs of aspiration with: Moderate assistance;Supervision/safety;Set-up Swallow Study Goal #1 - Progress: Progressing toward goal Patient will utilize recommended strategies during swallow to increase swallowing safety with: Maximum assistance Swallow Study Goal #2 - Progress: Progressing toward goal Goal #3: Patient will maintain aroused state for 15 minutes with min clinician cues for diagnostic po trials.  Swallow Study Goal #3 - Progress: Met  General Temperature Spikes Noted: Yes (low grade temp) Respiratory Status: Room air Behavior/Cognition: Alert;Distractible;Requires cueing;Decreased sustained attention Oral Cavity - Dentition: Adequate natural dentition Patient Positioning: Upright in bed  Oral Cavity - Oral Hygiene Does patient have any of the following "at risk" factors?: Nutritional status - dependent feeder Brush patient's teeth BID with toothbrush (using toothpaste with fluoride): Yes Patient is HIGH RISK - Oral Care Protocol followed (see row info): Yes Patient is AT RISK - Oral Care Protocol followed (see row info): Yes   Dysphagia  Treatment Treatment focused on: Skilled observation of diet tolerance;Patient/family/caregiver Dealer Educated: spouse & son Treatment Methods/Modalities: Skilled observation Patient observed directly with PO's: Yes Type of PO's observed: Regular;Thin liquids Feeding: Able to feed self;Needs assist;Needs set up (Difficulty seeing plate/food; poor depth percetion; r neglec) Liquids provided via: Cup (Would not suck through straw.) Oral Phase Signs & Symptoms: Right pocketing;Prolonged bolus formation;Prolonged oral phase Type of cueing: Verbal;Tactile;Visual Amount of cueing: Maximal   GO     Brady Frazier T 02/25/2013, 2:59 PM

## 2013-02-25 NOTE — Progress Notes (Signed)
Rehab admissions - Evaluated for possible admission.  Noted patient is interested in the [facility] SNF.  Given that patient has [facility], SNF is an excellent idea.  I anticipate difficulty getting approval for inpatient rehab from [facility] even though patient has had a stroke.  Recommend and agree with SNF level placement.  Call me for questions.  #[phone]

## 2013-02-25 NOTE — H&P (View-Only) (Signed)
Stroke Team Progress Note  HISTORY Brady Frazier is a 72 y.o. male who was well on the morning of 02/20/13 per family. Patient was watching television with his wife when she noted that he was drooling. Wife then began to ask questions of the patient and his answers were not accurate, speech was slurred. Patient is at baseline ambulatory and walking with a limp but was unable to ambulate at this time as well. Patient was brought to [facility] ED at that time for further evaluation where he was evaluated by Teleneurology services. tPA was administered and patient was transferred to [facility] for further care. Patient's pre-tPA NIHSS was 7. Upon arrival at [facility] NIH score was 5.  SUBJECTIVE Patient sitting up eating breakfast. Son at bedside. Patient more awake, not speaking.  OBJECTIVE Most recent Vital Signs: Filed Vitals:   02/24/13 0200 02/24/13 0400 02/24/13 0430 02/24/13 0600  BP: 155/76 172/76 154/77 158/69  Pulse: 61 57 58 58  Temp:      TempSrc:      Resp: 22 19 18 16  Height:      Weight:      SpO2: 97% 99% 99% 99%   CBG (last 3)   Recent Labs  02/23/13 1201 02/23/13 1615 02/23/13 1949  GLUCAP 112* 126* 123*   IV Fluid Intake:   . sodium chloride 75 mL/hr at 02/24/13 0600   MEDICATIONS  . amLODipine  5 mg Oral Daily  . atorvastatin  20 mg Oral q1800  . enoxaparin (LOVENOX) injection  40 mg Subcutaneous Q24H  . hydrochlorothiazide  25 mg Oral Daily  . insulin aspart  0-15 Units Subcutaneous TID WC  . LORazepam  1 mg Intravenous Once   PRN:  acetaminophen, acetaminophen, hydrALAZINE, labetalol, morphine injection, ondansetron (ZOFRAN) IV, senna-docusate  Diet:  Dysphagia Activity:  Bedrest  DVT Prophylaxis:  SCDs  CLINICALLY SIGNIFICANT STUDIES Basic Metabolic Panel:   Recent Labs Lab 02/20/13 1154 02/24/13 0404  NA 136 141  K 4.8 3.9  CL 103 112  CO2 26 22  GLUCOSE 244* 145*  BUN 19 25*  CREATININE 2.19* 2.36*  CALCIUM 8.7 8.2*   Liver Function Tests:   Recent  Labs Lab 02/20/13 1154  AST 17  ALT 14  ALKPHOS 71  BILITOT 0.4  PROT 6.9  ALBUMIN 3.0*   CBC:   Recent Labs Lab 02/20/13 1154 02/24/13 0404  WBC 7.2 12.2*  HGB 11.3* 10.0*  HCT 31.4* 28.5*  MCV 84.9 84.8  PLT 214 198   Coagulation:   Recent Labs Lab 02/20/13 1154  LABPROT 13.8  INR 1.07   Cardiac Enzymes:   Recent Labs Lab 02/20/13 1154  TROPONINI <0.30   Urinalysis:   Recent Labs Lab 02/20/13 1506  COLORURINE YELLOW  LABSPEC 1.011  PHURINE 6.5  GLUCOSEU 100*  HGBUR TRACE*  BILIRUBINUR NEGATIVE  KETONESUR NEGATIVE  PROTEINUR >300*  UROBILINOGEN 1.0  NITRITE NEGATIVE  LEUKOCYTESUR NEGATIVE   Lipid Panel    Component Value Date/Time   CHOL 152 02/21/2013 0330   TRIG 56 02/21/2013 0330   HDL 62 02/21/2013 0330   CHOLHDL 2.5 02/21/2013 0330   VLDL 11 02/21/2013 0330   LDLCALC 79 02/21/2013 0330   HgbA1C  Lab Results  Component Value Date   HGBA1C 5.7* 02/21/2013   Urine Drug Screen:   No results found for this basename: labopia,  cocainscrnur,  labbenz,  amphetmu,  thcu,  labbarb    Alcohol Level: No results found for this basename: ETH,  in   the last 168 hours  Ct Head Wo Contrast 02/20/2013 18:37  1.  New left parietal parenchymal hemorrhage with some extension into the subarachnoid space in the posterior left sylvian fissure and tentorium. 2.  Stable remote infarcts. 02/20/2013 12:16  1.  Remote left superior cerebellar infarct. 2.  Stable additional bilateral remote infarcts of the cerebellum, posterior parietal and occipital lobes bilaterally.  Ischemic changes are predominately in the posterior circulation. 3.  Stable atrophy and white matter disease. 4.  Stable ventricular dilation, proportionate to the atrophy and with ex vacuo dilation associated with prior infarcts.      Chest Xray 02/20/2013   1.  Low lung volumes and minimal bibasilar atelectasis.  2.  No acute cardiopulmonary disease.     MRI of the brain 02/22/2013  68 x 52 x 42 mm,  approximate 70 ml intracerebral hemorrhage, with intraventricular extension. Multiple areas of left hemisphere restricted diffusion consistent with acute non hemorrhagic embolic infarction.  Findings consistent with post t-PA hemorrhage without visible underlying vascular malformation.  Areas of atrophy, small vessel disease, and chronic infarction elsewhere   MRA of the brain 02/22/2013   No proximal stenosis or vascular malformation.  No intracranial aneurysm is evident.    2D Echocardiogram  EF 60-65% with no source of embolus.   Carotid Doppler No evidence of hemodynamically significant internal carotid artery stenosis. Vertebral artery flow is antegrade.   EKG  SR rate 64 BPM  EEG   TEE  EEG No epileptiform activity was noted.   Therapy Recommendations  CIR  Physical Exam   Elderly african american male  Not in distress.  Afebrile. Head is nontraumatic. Neck is supple without bruit. Cardiac exam no murmur or gallop. Lungs are clear to auscultation. Distal pulses are well felt.  NEUROLOGIC:  MENTAL STATUS: Alert. Speech nonfluent and few words only,  follows simple commands.  CRANIAL NERVES: pupils equal and reactive to light, left gaze preference. Able to look to right upto midline only. Decrease blink to threat on right side.,right lower face weakness. uvula midlinec, tongue midline MOTOR: normal bulk and tone, Strength  Mildly diminished on right but has antigravity movements  Left more than right- SENSORY: normal and symmetric to light touch  COORDINATION: finger-nose-finger and  - heel to shin unable to test REFLEXES: deep tendon reflexes present and symmetric - no babinski  ASSESSMENT Mr. Levoy Watson is a 72 y.o. male presenting with dysarthria and mild right hemiparesis. Status post IV t-PA  at 13:15 on 02/20/13 at [facility], transferred to [facility] for care. patient with post tpa symptomatic left parietal parenchymal hemorrhage with extension into the subarachnoid space. 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 Infarcts felt to be secondary to unknown etiology.  On no antiplatelets prior to admission. Now on no antiplatelets secondary to post tPA hemorrhage. Patient with resultant  right hemiparesis, global aphasia, dysphagia. Work up underway.   DM - HEMOGLOBIN A1c 5.7  Hypertension  Renal Disease  Previous strokes  Anemia.  Hospital day # 4  TREATMENT/PLAN  Risk factor modification. Strict Control hypertension. Begin home BP medications  TEE to look for embolic source. Arranged with [facility] for Thursday.  If positive for PFO (patent foramen ovale), check bilateral lower extremity venous dopplers to rule out DVT as possible source of stroke  Transfer out of ICU   Lynn D. Brown, PAC, MBA, MHA [facility] Pager: [phone] 02/24/2013 7:42 AM  I have personally obtained a history, examined the patient, evaluated   imaging results, and formulated the assessment and plan of care. I agree with the above.  Emillia Weatherly 

## 2013-02-25 NOTE — Progress Notes (Addendum)
Stroke Team Progress Note  HISTORY Brady Frazier is a 72 y.o. male who was well on the morning of 02/20/13 per family. Patient was watching television with his wife when she noted that he was drooling. Wife then began to ask questions of the patient and his answers were not accurate, speech was slurred. Patient is at baseline ambulatory and walking with a limp but was unable to ambulate at this time as well. Patient was brought to [facility] ED at that time for further evaluation where he was evaluated by Teleneurology services. tPA was administered and patient was transferred to [facility] for further care. Patient's pre-tPA NIHSS was 7. Upon arrival at [facility] NIH score was 5.  SUBJECTIVE  Patient to TEE today   OBJECTIVE Most recent Vital Signs: Filed Vitals:   02/25/13 0600 02/25/13 0956 02/25/13 1036 02/25/13 1037  BP: 165/60 158/98  185/68  Pulse: 59 62  70  Temp: 99.3 F (37.4 C) 98.9 F (37.2 C) 99 F (37.2 C)   TempSrc:  Oral Oral   Resp: 18 18  18   Height:      Weight:      SpO2: 99% 100%  100%   CBG (last 3)   Recent Labs  02/24/13 1620 02/24/13 2143 02/25/13 0634  GLUCAP 200* 108* 133*   IV Fluid Intake:   . sodium chloride 20 mL/hr at 02/24/13 1400  . sodium chloride     MEDICATIONS  . amLODipine  5 mg Oral Daily  . atorvastatin  20 mg Oral q1800  . enoxaparin (LOVENOX) injection  30 mg Subcutaneous Q24H  . feeding supplement  237 mL Oral BID BM  . hydrochlorothiazide  25 mg Oral Daily  . insulin aspart  0-15 Units Subcutaneous TID WC  . insulin glargine  25 Units Subcutaneous QHS  . LORazepam  1 mg Intravenous Once   PRN:  acetaminophen, acetaminophen, hydrALAZINE, labetalol, morphine injection, ondansetron (ZOFRAN) IV, senna-docusate  Diet:  NPO- carb mod after TEE Activity:  Up with assist DVT Prophylaxis:  SCD  CLINICALLY SIGNIFICANT STUDIES Basic Metabolic Panel:   Recent Labs Lab 02/20/13 1154 02/24/13 0404  NA 136 141  K 4.8 3.9  CL 103 112  CO2 26 22   GLUCOSE 244* 145*  BUN 19 25*  CREATININE 2.19* 2.36*  CALCIUM 8.7 8.2*   Liver Function Tests:   Recent Labs Lab 02/20/13 1154  AST 17  ALT 14  ALKPHOS 71  BILITOT 0.4  PROT 6.9  ALBUMIN 3.0*   CBC:   Recent Labs Lab 02/20/13 1154 02/24/13 0404  WBC 7.2 12.2*  HGB 11.3* 10.0*  HCT 31.4* 28.5*  MCV 84.9 84.8  PLT 214 198   Coagulation:   Recent Labs Lab 02/20/13 1154  LABPROT 13.8  INR 1.07   Cardiac Enzymes:   Recent Labs Lab 02/20/13 1154  TROPONINI <0.30   Urinalysis:   Recent Labs Lab 02/20/13 1506  COLORURINE YELLOW  LABSPEC 1.011  PHURINE 6.5  GLUCOSEU 100*  HGBUR TRACE*  BILIRUBINUR NEGATIVE  KETONESUR NEGATIVE  PROTEINUR >300*  UROBILINOGEN 1.0  NITRITE NEGATIVE  LEUKOCYTESUR NEGATIVE   Lipid Panel    Component Value Date/Time   CHOL 152 02/21/2013 0330   TRIG 56 02/21/2013 0330   HDL 62 02/21/2013 0330   CHOLHDL 2.5 02/21/2013 0330   VLDL 11 02/21/2013 0330   LDLCALC 79 02/21/2013 0330   HgbA1C  Lab Results  Component Value Date   HGBA1C 5.7* 02/21/2013   Urine Drug Screen:  No results found for this basename: labopia,  cocainscrnur,  labbenz,  amphetmu,  thcu,  labbarb    Alcohol Level: No results found for this basename: ETH,  in the last 168 hours  Ct Head Wo Contrast 02/20/2013 18:37  1.  New left parietal parenchymal hemorrhage with some extension into the subarachnoid space in the posterior left sylvian fissure and tentorium. 2.  Stable remote infarcts. 02/20/2013 12:16  1.  Remote left superior cerebellar infarct. 2.  Stable additional bilateral remote infarcts of the cerebellum, posterior parietal and occipital lobes bilaterally.  Ischemic changes are predominately in the posterior circulation. 3.  Stable atrophy and white matter disease. 4.  Stable ventricular dilation, proportionate to the atrophy and with ex vacuo dilation associated with prior infarcts.      Chest Xray 02/20/2013   1.  Low lung volumes and minimal  bibasilar atelectasis.  2.  No acute cardiopulmonary disease.     MRI of the brain 02/22/2013  68 x 52 x 42 mm, approximate 70 ml intracerebral hemorrhage, with intraventricular extension. Multiple areas of left hemisphere restricted diffusion consistent with acute non hemorrhagic embolic infarction.  Findings consistent with post t-PA hemorrhage without visible underlying vascular malformation.  Areas of atrophy, small vessel disease, and chronic infarction elsewhere   MRA of the brain 02/22/2013   No proximal stenosis or vascular malformation.  No intracranial aneurysm is evident.    2D Echocardiogram  EF 60-65% with no source of embolus.   Carotid Doppler No evidence of hemodynamically significant internal carotid artery stenosis. Vertebral artery flow is antegrade.   EKG  SR rate 64 BPM  EEG   TEE  LV function normal; mild atherosclerosis descending aorta; atrial septal aneurysm; PFO noted by color doppler  EEG No epileptiform activity was noted.   Therapy Recommendations  SNF  Physical Exam   Elderly african american male  Not in distress.  Afebrile. Head is nontraumatic. Neck is supple without bruit. Cardiac exam no murmur or gallop. Lungs are clear to auscultation. Distal pulses are well felt.  NEUROLOGIC:  MENTAL STATUS: Alert. Speech nonfluent and few words only,  follows simple commands.  CRANIAL NERVES: pupils equal and reactive to light, left gaze preference. Able to look to right upto midline only. Decrease blink to threat on right side.,right lower face weakness. uvula midlinec, tongue midline MOTOR: normal bulk and tone, Strength  Mildly diminished on right but has antigravity movements  Left more than right- SENSORY: normal and symmetric to light touch  COORDINATION: finger-nose-finger and  - heel to shin unable to test REFLEXES: deep tendon reflexes present and symmetric - no babinski  ASSESSMENT Mr. Brady Frazier is a 72 y.o. male presenting with dysarthria and mild  right hemiparesis. Status post IV t-PA  at 13:15 on 02/20/13 at [facility], transferred to [facility] for care. patient with post tpa symptomatic left parietal parenchymal hemorrhage with extension into the subarachnoid space. 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 Infarcts felt to be secondary to unknown etiology.  On no antiplatelets prior to admission. Now on no antiplatelets secondary to post tPA hemorrhage. Patient with resultant  right hemiparesis, global aphasia, dysphagia. Work up underway.   DM - HEMOGLOBIN A1c 5.7  Hypertension  Renal Disease  Previous strokes  Anemia.  Hyperlipidemia, LDL 79, goal < 70 in diabetic patients, on Lipitor  Patent foramen ovale  Hospital day # 5  TREATMENT/PLAN  Risk factor modification. Strict Control hypertension, home medications started. TEE shows PFO; therefore will check bilateral  lower extremity venous dopplers to rule out DVT as possible source of stroke SNF recommended. If LE dopplers negative, plan for transfer to [facility] 5/16. I did speak with social worker regarding plans (Ms. Gates Rigg [phone])  Gwendolyn Lima. Manson Passey, [facility], MBA, MHA [facility] Pager: [phone] 02/25/2013 2:26 PM  I have personally  , evaluated imaging results, and formulated the assessment and plan of care. I agree with the above. Delia Heady

## 2013-02-25 NOTE — CV Procedure (Signed)
See full TEE report in [employer]; LV function normal; mild atherosclerosis descending aorta; atrial septal aneurysm; PFO noted by color doppler. Olga Millers

## 2013-02-26 ENCOUNTER — Inpatient Hospital Stay
Admission: RE | Admit: 2013-02-26 | Discharge: 2013-12-12 | Disposition: A | Discharge status: Other Acute Inpatient Hospital | Payer: Medicare HMO | Source: Ambulatory Visit | Attending: Internal Medicine | Admitting: Internal Medicine

## 2013-02-26 ENCOUNTER — Encounter (HOSPITAL_COMMUNITY): Payer: Self-pay | Admitting: Cardiology

## 2013-02-26 DIAGNOSIS — R609 Edema, unspecified: Secondary | ICD-10-CM

## 2013-02-26 DIAGNOSIS — R52 Pain, unspecified: Secondary | ICD-10-CM

## 2013-02-26 DIAGNOSIS — Q211 Atrial septal defect: Secondary | ICD-10-CM

## 2013-02-26 DIAGNOSIS — E785 Hyperlipidemia, unspecified: Secondary | ICD-10-CM | POA: Diagnosis present

## 2013-02-26 DIAGNOSIS — M25562 Pain in left knee: Secondary | ICD-10-CM

## 2013-02-26 DIAGNOSIS — R509 Fever, unspecified: Principal | ICD-10-CM

## 2013-02-26 LAB — GLUCOSE, CAPILLARY
Glucose-Capillary: 136 mg/dL — ABNORMAL HIGH (ref 70–99)
Glucose-Capillary: 251 mg/dL — ABNORMAL HIGH (ref 70–99)

## 2013-02-26 MED ORDER — ENSURE COMPLETE PO LIQD: 237.0000 mL | Freq: Two times a day (BID) | ORAL | Status: DC

## 2013-02-26 MED ORDER — HYDROCHLOROTHIAZIDE 25 MG PO TABS: 25.0000 mg | ORAL_TABLET | Freq: Every day | ORAL | Status: DC

## 2013-02-26 NOTE — Progress Notes (Signed)
Physical Therapy Treatment Patient Details Name: Brady Frazier MRN: [id number] DOB: June 22, 1941 Today's Date: 02/26/2013 Time: 1478-2956 PT Time Calculation (min): 27 min  PT Assessment / Plan / Recommendation Comments on Treatment Session  Pt awake and alert on arrival, yet son reporting he has been less verbal and active today. He required incr assist today and was unable to ambulate. Perhaps due to fatigue from sitting up in chair prior to session.    Follow Up Recommendations  SNF;Supervision/Assistance - 24 hour     Does the patient have the potential to tolerate intense rehabilitation     Barriers to Discharge        Equipment Recommendations  None recommended by PT    Recommendations for Other Services    Frequency Min 3X/week   Plan Discharge plan remains appropriate;Frequency needs to be updated    Precautions / Restrictions Precautions Precautions: Fall Restrictions Weight Bearing Restrictions: No   Pertinent Vitals/Pain no apparent distress     Mobility  Bed Mobility Bed Mobility: Rolling Left;Sit to Sidelying Right;Scooting to [facility] Rolling Left: 1: +1 Total assist Sit to Sidelying Right: 1: +2 Total assist;HOB flat Sit to Sidelying Right: Patient Percentage: 10% Scooting to HOB: 1: +2 Total assist Scooting to [facility]: Patient Percentage: 0% Details for Bed Mobility Assistance: ? fatigue from being up in chair x 1 hour (per son) impacting pt's ability to attend to tasks; required incr assist today (nursing reports same for when they helped him OOB) Transfers Transfers: Sit to Stand;Stand to Sit;Stand Pivot Transfers Sit to Stand: 1: +2 Total assist;From chair/3-in-1;From bed Sit to Stand: Patient Percentage: 40% Stand to Sit: To chair/3-in-1;1: +2 Total assist;To bed Stand to Sit: Patient Percentage: 50% Stand Pivot Transfers: 1: +2 Total assist Stand Pivot Transfers: Patient Percentage: 50% Details for Transfer Assistance: given simple, forceful command "stand  up" along with tactile cues and physical assist and pt engaged in standing x 3 reps Ambulation/Gait Ambulation/Gait Assistance: Other (comment) (unable other than pivotal steps chair to bed)         PT Goals Acute Rehab PT Goals Time For Goal Achievement: 03/08/13 Potential to Achieve Goals: Good Pt will go Supine/Side to Sit: with supervision Pt will go Sit to Supine/Side: with supervision PT Goal: Sit to Supine/Side - Progress: Not progressing Pt will go Sit to Stand: with min assist PT Goal: Sit to Stand - Progress: Not progressing Pt will go Stand to Sit: with supervision PT Goal: Stand to Sit - Progress: Not progressing Pt will Transfer Bed to Chair/Chair to Bed: with min assist PT Transfer Goal: Bed to Chair/Chair to Bed - Progress: Not progressing Pt will Stand: with supervision;with unilateral upper extremity support;3 - 5 min PT Goal: Stand - Progress: Not progressing Pt will Ambulate: 51 - 150 feet;with least restrictive assistive device;with min assist  Visit Information  Last PT Received On: 02/26/13 Assistance Needed: +2    Subjective Data  Subjective: pt non-verbal during session; son reports not much talking today   Cognition  Cognition Arousal/Alertness: Awake/alert Behavior During Therapy: Flat affect Overall Cognitive Status: Impaired/Different from baseline Area of Impairment: Attention;Following commands;Problem solving Current Attention Level: Focused Following Commands: Follows one step commands inconsistently Problem Solving: Slow processing;Decreased initiation;Difficulty sequencing;Requires verbal cues;Requires tactile cues General Comments: given up to 45 seconds to attempt to follow commands due to previous delayed responses, however pt with limited attention and did not ultimately follow commands    Balance  Static Sitting Balance Static Sitting - Balance Support: No  upper extremity supported;Feet supported Static Sitting - Level of Assistance:  2: Max assist Static Sitting - Comment/# of Minutes: edge of chair, pt leaning/losing balance posteriorly. After forward bending/stretching, pt able to maintain upright sitting with min assist for up to 10 seconds Static Standing Balance Static Standing - Balance Support: Bilateral upper extremity supported Static Standing - Level of Assistance: 1: +2 Total assist Static Standing - Comment/# of Minutes: pt holding forearms of person on each side of him, facilitation at pelvis bil to extend hips  End of Session PT - End of Session Equipment Utilized During Treatment: Gait belt Activity Tolerance: Patient limited by fatigue Patient left: with call bell/phone within reach;in bed;with bed alarm set   GP     Mccade Sullenberger 02/26/2013, 4:43 PM Pager [phone]

## 2013-02-26 NOTE — Progress Notes (Signed)
Stroke Team Progress Note  HISTORY Brady Frazier is a 72 y.o. male who was well on the morning of 02/20/13 per family. Patient was watching television with his wife when she noted that he was drooling. Wife then began to ask questions of the patient and his answers were not accurate, speech was slurred. Patient is at baseline ambulatory and walking with a limp but was unable to ambulate at this time as well. Patient was brought to [facility] ED at that time for further evaluation where he was evaluated by Teleneurology services. tPA was administered and patient was transferred to [facility] for further care. Patient's pre-tPA NIHSS was 7. Upon arrival at [facility] NIH score was 5.  SUBJECTIVE Family at bedside. Plans for discharge to [facility] center today.  OBJECTIVE Most recent Vital Signs: Filed Vitals:   02/26/13 0148 02/26/13 0315 02/26/13 0642 02/26/13 0959  BP: 194/76 189/71 177/73 147/75  Pulse: 79  72 83  Temp: 99.1 F (37.3 C)  97.3 F (36.3 C) 99.5 F (37.5 C)  TempSrc: Oral  Oral Axillary  Resp: 17  16 18   Height:      Weight:      SpO2: 94%  100% 95%   CBG (last 3)   Recent Labs  02/25/13 1631 02/25/13 2058 02/26/13 0733  GLUCAP 163* 104* 136*   IV Fluid Intake:   . sodium chloride 20 mL/hr at 02/24/13 1400   MEDICATIONS  . amLODipine  5 mg Oral Daily  . atorvastatin  20 mg Oral q1800  . enoxaparin (LOVENOX) injection  30 mg Subcutaneous Q24H  . feeding supplement  237 mL Oral BID BM  . hydrochlorothiazide  25 mg Oral Daily  . insulin aspart  0-15 Units Subcutaneous TID WC  . insulin glargine  25 Units Subcutaneous QHS  . LORazepam  1 mg Intravenous Once   PRN:  acetaminophen, acetaminophen, hydrALAZINE, labetalol, morphine injection, ondansetron (ZOFRAN) IV, senna-docusate  Diet:  Dysphagia 3 carb mod, thin liquids Activity:  Up with assist DVT Prophylaxis:  SCD  CLINICALLY SIGNIFICANT STUDIES Basic Metabolic Panel:   Recent Labs Lab 02/20/13 1154 02/24/13 0404  NA 136  141  K 4.8 3.9  CL 103 112  CO2 26 22  GLUCOSE 244* 145*  BUN 19 25*  CREATININE 2.19* 2.36*  CALCIUM 8.7 8.2*   Liver Function Tests:   Recent Labs Lab 02/20/13 1154  AST 17  ALT 14  ALKPHOS 71  BILITOT 0.4  PROT 6.9  ALBUMIN 3.0*   CBC:   Recent Labs Lab 02/20/13 1154 02/24/13 0404  WBC 7.2 12.2*  HGB 11.3* 10.0*  HCT 31.4* 28.5*  MCV 84.9 84.8  PLT 214 198   Coagulation:   Recent Labs Lab 02/20/13 1154  LABPROT 13.8  INR 1.07   Cardiac Enzymes:   Recent Labs Lab 02/20/13 1154  TROPONINI <0.30   Urinalysis:   Recent Labs Lab 02/20/13 1506  COLORURINE YELLOW  LABSPEC 1.011  PHURINE 6.5  GLUCOSEU 100*  HGBUR TRACE*  BILIRUBINUR NEGATIVE  KETONESUR NEGATIVE  PROTEINUR >300*  UROBILINOGEN 1.0  NITRITE NEGATIVE  LEUKOCYTESUR NEGATIVE   Lipid Panel    Component Value Date/Time   CHOL 152 02/21/2013 0330   TRIG 56 02/21/2013 0330   HDL 62 02/21/2013 0330   CHOLHDL 2.5 02/21/2013 0330   VLDL 11 02/21/2013 0330   LDLCALC 79 02/21/2013 0330   HgbA1C  Lab Results  Component Value Date   HGBA1C 5.7* 02/21/2013   Urine Drug Screen:  No results found for this basename: labopia,  cocainscrnur,  labbenz,  amphetmu,  thcu,  labbarb    Alcohol Level: No results found for this basename: ETH,  in the last 168 hours  Ct Head Wo Contrast 02/20/2013 18:37  1.  New left parietal parenchymal hemorrhage with some extension into the subarachnoid space in the posterior left sylvian fissure and tentorium. 2.  Stable remote infarcts. 02/20/2013 12:16  1.  Remote left superior cerebellar infarct. 2.  Stable additional bilateral remote infarcts of the cerebellum, posterior parietal and occipital lobes bilaterally.  Ischemic changes are predominately in the posterior circulation. 3.  Stable atrophy and white matter disease. 4.  Stable ventricular dilation, proportionate to the atrophy and with ex vacuo dilation associated with prior infarcts.      Chest Xray  02/20/2013   1.  Low lung volumes and minimal bibasilar atelectasis.  2.  No acute cardiopulmonary disease.     MRI of the brain 02/22/2013  68 x 52 x 42 mm, approximate 70 ml intracerebral hemorrhage, with intraventricular extension. Multiple areas of left hemisphere restricted diffusion consistent with acute non hemorrhagic embolic infarction.  Findings consistent with post t-PA hemorrhage without visible underlying vascular malformation.  Areas of atrophy, small vessel disease, and chronic infarction elsewhere   MRA of the brain 02/22/2013   No proximal stenosis or vascular malformation.  No intracranial aneurysm is evident.    2D Echocardiogram  EF 60-65% with no source of embolus.   Carotid Doppler No evidence of hemodynamically significant internal carotid artery stenosis. Vertebral artery flow is antegrade.   EKG  SR rate 64 BPM  EEG This is an EEG that is characterized by general background slowing. Although this may be seen in drowse cannot rule out the possibility of a generalized diffuse disturbance that is etiologically nonspecific, and may include a metabolic encephalopathy among other possibilities. No epileptiform activity was noted.  TEE  LV function normal; mild atherosclerosis descending aorta; atrial septal aneurysm; PFO noted by color doppler  LE Venous Dopplers Bilateral: No evidence of DVT, superficial thrombosis, or Baker's Cyst.   EEG No epileptiform activity was noted.   Therapy Recommendations  SNF  Physical Exam   Elderly african american male  Not in distress.  Afebrile. Head is nontraumatic. Neck is supple without bruit. Cardiac exam no murmur or gallop. Lungs are clear to auscultation. Distal pulses are well felt.  NEUROLOGIC:  MENTAL STATUS: Alert. Speech nonfluent and few words only,  follows simple commands.  CRANIAL NERVES: pupils equal and reactive to light, left gaze preference. Able to look to right upto midline only. Decrease blink to threat on right  side.,right lower face weakness. uvula midlinec, tongue midline MOTOR: normal bulk and tone, Strength  Mildly diminished on right but has antigravity movements  Left more than right- SENSORY: normal and symmetric to light touch  COORDINATION: finger-nose-finger and  - heel to shin unable to test REFLEXES: deep tendon reflexes present and symmetric - no babinski  ASSESSMENT Mr. Brady Frazier is a 72 y.o. male presenting with dysarthria and mild right hemiparesis. Status post IV t-PA  at 13:15 on 02/20/13 at [facility], transferred to [facility] for care. patient with post tpa symptomatic left parietal parenchymal hemorrhage with extension into the subarachnoid space. 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 Infarcts felt to be secondary to unknown etiology.  On no antiplatelets prior to admission. Now on no antiplatelets secondary to post tPA hemorrhage. Patient with resultant  right hemiparesis, global aphasia, dysphagia.  Work up completed. Medically ready for discharge to rehab at SNF.  Hospital day # 6  TREATMENT/PLAN  Risk factor modification. D/c to SNF   Annie Main, MSN, RN, ANVP-BC, ANP-BC, [facility] Pager: [phone] 02/26/2013 11:20 AM  I have personally obtained a history, examined the patient, evaluated imaging results, and formulated the assessment and plan of care. I agree with the above.  Delia Heady, MD

## 2013-02-26 NOTE — Progress Notes (Signed)
Pt discharged to [facility] Nursing home by [facility]  transsport report called to receiving facility  Spoke with Kandice Moos the nurse receiving the pt.   Condition stable for discharge.  Left message for the wife on their voicemail. Azzie Roup Rn

## 2013-02-26 NOTE — Discharge Summary (Signed)
Stroke Discharge Summary  Patient ID: Brady Frazier   MRN: [id number]      DOB: 1940-11-16  Date of Admission: 02/20/2013 Date of Discharge: 02/26/2013  Attending Physician:  Darcella Cheshire, MD, Stroke MD  Consulting Physician(s):      Faith Rogue, MD (Physical Medicine & Rehabtilitation)  Patient's PCP:  Carylon Perches, MD  Discharge Diagnoses:  Principal Problem:   Acute ischemic left middle cerebral artery (MCA) stroke - s/p tPA at outlying hospital, embolic etiology, source unknown Active Problems:   Cerebral parenchymal hemorrhage, post tpa   Diabetes   Essential hypertension, benign   Other and unspecified hyperlipidemia   PFO (patent foramen ovale)  BMI: Body mass index is 28.06 kg/(m^2).  Past Medical History  Diagnosis Date  . Diabetes mellitus   . Hypertension   . Stroke   . Kidney disease    Past Surgical History  Procedure Laterality Date  . Stomach surgery    . Tee without cardioversion N/A 02/25/2013    Procedure: TRANSESOPHAGEAL ECHOCARDIOGRAM (TEE);  Surgeon: Lewayne Bunting, MD;  Location: [facility] ENDOSCOPY;  Service: Cardiovascular;  Laterality: N/A;  Rosann Auerbach Fawn Kirk      Medication List    TAKE these medications       amLODipine 5 MG tablet  Commonly known as:  NORVASC  Take 5 mg by mouth daily.     atorvastatin 20 MG tablet  Commonly known as:  LIPITOR  Take 20 mg by mouth daily.     feeding supplement Liqd  Take 237 mLs by mouth 2 (two) times daily between meals.     hydrochlorothiazide 25 MG tablet  Commonly known as:  HYDRODIURIL  Take 1 tablet (25 mg total) by mouth daily.     insulin glargine 100 UNIT/ML injection  Commonly known as:  LANTUS  Inject 25 Units into the skin at bedtime.        LABORATORY STUDIES CBC    Component Value Date/Time   WBC 12.2* 02/24/2013 0404   RBC 3.36* 02/24/2013 0404   HGB 10.0* 02/24/2013 0404   HCT 28.5* 02/24/2013 0404   PLT 198 02/24/2013 0404   MCV 84.8 02/24/2013 0404   MCH 29.8 02/24/2013 0404    MCHC 35.1 02/24/2013 0404   RDW 12.8 02/24/2013 0404   LYMPHSABS 0.9 07/04/2010 0442   MONOABS 0.5 07/04/2010 0442   EOSABS 0.2 07/04/2010 0442   BASOSABS 0.0 07/04/2010 0442   CMP    Component Value Date/Time   NA 141 02/24/2013 0404   K 3.9 02/24/2013 0404   CL 112 02/24/2013 0404   CO2 22 02/24/2013 0404   GLUCOSE 145* 02/24/2013 0404   BUN 25* 02/24/2013 0404   CREATININE 2.36* 02/24/2013 0404   CALCIUM 8.2* 02/24/2013 0404   PROT 6.9 02/20/2013 1154   ALBUMIN 3.0* 02/20/2013 1154   AST 17 02/20/2013 1154   ALT 14 02/20/2013 1154   ALKPHOS 71 02/20/2013 1154   BILITOT 0.4 02/20/2013 1154   GFRNONAA 26* 02/24/2013 0404   GFRAA 30* 02/24/2013 0404   COAGS Lab Results  Component Value Date   INR 1.07 02/20/2013   Lipid Panel    Component Value Date/Time   CHOL 152 02/21/2013 0330   TRIG 56 02/21/2013 0330   HDL 62 02/21/2013 0330   CHOLHDL 2.5 02/21/2013 0330   VLDL 11 02/21/2013 0330   LDLCALC 79 02/21/2013 0330   HgbA1C  Lab Results  Component Value Date   HGBA1C 5.7* 02/21/2013   Cardiac  Panel (last 3 results) No results found for this basename: CKTOTAL, CKMB, TROPONINI, RELINDX,  in the last 72 hours Urinalysis    Component Value Date/Time   COLORURINE YELLOW 02/20/2013 1506   APPEARANCEUR CLEAR 02/20/2013 1506   LABSPEC 1.011 02/20/2013 1506   PHURINE 6.5 02/20/2013 1506   GLUCOSEU 100* 02/20/2013 1506   HGBUR TRACE* 02/20/2013 1506   BILIRUBINUR NEGATIVE 02/20/2013 1506   KETONESUR NEGATIVE 02/20/2013 1506   PROTEINUR >300* 02/20/2013 1506   UROBILINOGEN 1.0 02/20/2013 1506   NITRITE NEGATIVE 02/20/2013 1506   LEUKOCYTESUR NEGATIVE 02/20/2013 1506   Urine Drug Screen  No results found for this basename: labopia,  cocainscrnur,  labbenz,  amphetmu,  thcu,  labbarb    Alcohol Level No results found for this basename: eth    SIGNIFICANT DIAGNOSTIC STUDIES Ct Head Wo Contrast  02/20/2013 18:37 1. New left parietal parenchymal hemorrhage with some extension into the subarachnoid  space in the posterior left sylvian fissure and tentorium. 2. Stable remote infarcts.  02/20/2013 12:16 1. Remote left superior cerebellar infarct. 2. Stable additional bilateral remote infarcts of the cerebellum, posterior parietal and occipital lobes bilaterally. Ischemic changes are predominately in the posterior circulation. 3. Stable atrophy and white matter disease. 4. Stable ventricular dilation, proportionate to the atrophy and with ex vacuo dilation associated with prior infarcts.  Chest Xray 02/20/2013  1. Low lung volumes and minimal bibasilar atelectasis.  2. No acute cardiopulmonary disease.  MRI of the brain 02/22/2013 68 x 52 x 42 mm, approximate 70 ml intracerebral hemorrhage, with intraventricular extension. Multiple areas of left hemisphere restricted diffusion consistent with acute non hemorrhagic embolic infarction. Findings consistent with post t-PA hemorrhage without visible underlying vascular malformation. Areas of atrophy, small vessel disease, and chronic infarction elsewhere  MRA of the brain 02/22/2013 No proximal stenosis or vascular malformation. No intracranial aneurysm is evident.  2D Echocardiogram EF 60-65% with no source of embolus.  Carotid Doppler No evidence of hemodynamically significant internal carotid artery stenosis. Vertebral artery flow is antegrade.  EKG SR rate 64 BPM  EEG This is an EEG that is characterized by general background slowing. Although this may be seen in drowse cannot rule out the possibility of a generalized diffuse disturbance that is etiologically nonspecific, and may include a metabolic encephalopathy among other possibilities. No epileptiform activity was noted.  TEE LV function normal; mild atherosclerosis descending aorta; atrial septal aneurysm; PFO noted by color doppler  LE Venous Dopplers Bilateral: No evidence of DVT, superficial thrombosis, or Baker's Cyst.  EEG No epileptiform activity was noted.    History of Present Illness    Brady Frazier is a 72 y.o. male who was well on the morning of 02/20/13 per family. Patient was watching television with his wife when she noted that he was drooling. Wife then began to ask questions of the patient and his answers were not accurate, speech was slurred. Patient is at baseline ambulatory and walking with a limp but was unable to ambulate at this time as well. Patient was brought to [facility] ED at that time for further evaluation where he was evaluated by Teleneurology services. tPA was administered and patient was transferred to [facility] for further care. Patient's pre-tPA NIHSS was 7. Upon arrival at [facility] NIH score was 5.  Hospital Course Patient with neurologic decline post tpa. Imaging revealed post tpa symptomatic left parietal intraparenchymal hemorrhage with extension into the subarachnoid space. BP during the night as high as 192/73 and 192/77. In setting of decline,  wife made pt a DNR. Aggressive BP management was instituted to SBP < 160 using Cardene and prn labetalol and hydralazine. Once pt able to take po, he was resumed on his home medications. HCTZ added.  MRI confirmed left MCA scattered embolic infarcts. Infarcts felt to be secondary to unknown etiology. No etiology found in hospital, including a negative TEE. Incidental PFO was found but not felt to be related to currently stroke as LE dopplers were negative.  Patient was on no antiplatelets prior to admission. Now on no antiplatelets secondary to post tPA hemorrhage. May consider resuming low dose aspirin 1 mon from time of hemorrhage.  DM - HEMOGLOBIN A1c 5.7  Hyperlipidemia, LDL 79, goal < 70 in diabetic patients, on Lipitor  WBC 12.2  Patient with continued stroke symptoms of  right hemiparesis, global aphasia, dysphagia. Physical therapy, occupational therapy and speech therapy evaluated patient. They recommend SNF placement. Bed has been secured and patient will be discharged there.  Discharge Exam  Blood pressure 147/75,  pulse 83, temperature 99.5 F (37.5 C), temperature source Axillary, resp. rate 18, height 5\' 8"  (1.727 m), weight 83.7 kg (184 lb 8.4 oz), SpO2 95.00%. Elderly african [place] male Not in distress. Afebrile. Head is nontraumatic. Neck is supple without bruit. Cardiac exam no murmur or gallop. Lungs are clear to auscultation. Distal pulses are well felt.  NEUROLOGIC:  MENTAL STATUS: Alert. Speech nonfluent and few words only, follows simple commands.  CRANIAL NERVES: pupils equal and reactive to light, left gaze preference. Able to look to right upto midline only. Decrease blink to threat on right side.,right lower face weakness. uvula midlinec, tongue midline  MOTOR: normal bulk and tone, Strength Mildly diminished on right but has antigravity movements Left more than right-  SENSORY: normal and symmetric to light touch  COORDINATION: finger-nose-finger and - heel to shin unable to test  REFLEXES: deep tendon reflexes present and symmetric - no babinski   Discharge Diet   Dysphagia 3, carb modified, thin liquids  Discharge Plan    Disposition:  skilled nursing facility    no antiplatelets for secondary stroke prevention at discharge due to post tPA hemorrhage. Consider low dose aspirin at time of follow up  Ongoing risk factor control by Primary Care Physician.  Follow-up Carylon Perches, MD of SNF MD   Follow-up with Dr. Delia Heady in 2 months.  35 minutes were spent preparing discharge.  Signed Annie Main, AVNP, ANP-BC, [facility] Nurse Practitioner 02/26/2013, 1:13 PM  I have personally examined this patient, reviewed pertinent data and developed the plan of care. I agree with above.  Delia Heady, MD

## 2013-03-01 ENCOUNTER — Ambulatory Visit (HOSPITAL_COMMUNITY)
Admit: 2013-03-01 | Discharge: 2013-03-01 | Disposition: A | Discharge status: Home / Self Care | Payer: Medicare HMO | Attending: Internal Medicine | Admitting: Internal Medicine

## 2013-03-01 DIAGNOSIS — I517 Cardiomegaly: Secondary | ICD-10-CM | POA: Insufficient documentation

## 2013-03-01 DIAGNOSIS — R509 Fever, unspecified: Secondary | ICD-10-CM | POA: Insufficient documentation

## 2013-03-01 LAB — GLUCOSE, CAPILLARY
Glucose-Capillary: 130 mg/dL — ABNORMAL HIGH (ref 70–99)
Glucose-Capillary: 104 mg/dL — ABNORMAL HIGH (ref 70–99)
Glucose-Capillary: 145 mg/dL — ABNORMAL HIGH (ref 70–99)

## 2013-03-01 NOTE — Clinical Social Work Placement (Addendum)
    Clinical Social Work Department CLINICAL SOCIAL WORK PLACEMENT NOTE 03/01/2013  Patient:  Brady Frazier, Brady Frazier  Account Number:  [id number] Admit date:  02/20/2013  Clinical Social Worker:  Macario Golds, LCSW  Date/time:  02/24/2013 09:30 AM  Clinical Social Work is seeking post-discharge placement for this patient at the following level of care:   SKILLED NURSING   (*CSW will update this form in Epic as items are completed)   02/24/2013  Patient/family provided with [facility] Department of Clinical Social Work's list of facilities offering this level of care within the geographic area requested by the patient (or if unable, by the patient's family).  02/24/2013  Patient/family informed of their freedom to choose among providers that offer the needed level of care, that participate in Medicare, Medicaid or managed care program needed by the patient, have an available bed and are willing to accept the patient.  02/24/2013  Patient/family informed of [facility]' ownership interest in [facility], as well as of the fact that they are under no obligation to receive care at this facility.  PASARR submitted to EDS on 02/24/2013 PASARR number received from EDS on 02/24/2013  FL2 transmitted to all facilities in geographic area requested by pt/family on  02/24/2013 FL2 transmitted to all facilities within larger geographic area on   Patient informed that his/her managed care company has contracts with or will negotiate with  certain facilities, including the following:     Patient/family informed of bed offers received:  02/25/2013 Patient chooses bed at [facility] Physician recommends and patient chooses bed at    Patient to be transferred to [facility] on  02/26/2013 Patient to be transferred to facility by Ambulance  Sharin Mons)  The following physician request were entered in Epic:   Additional Comments: 05/14 - Patient family preference is for [facility]   02/26/13  Patient and family are pleased with d/c plan.  Ok per facility and pt's nurse who will call report. No further CSW needs identified.  CSW signing off.    Lorri Frederick.Aidden Markovic, LCSWA  [phone]

## 2013-03-02 LAB — GLUCOSE, CAPILLARY

## 2013-03-03 LAB — GLUCOSE, CAPILLARY

## 2013-03-04 LAB — GLUCOSE, CAPILLARY: Glucose-Capillary: 109 mg/dL — ABNORMAL HIGH (ref 70–99)

## 2013-03-05 LAB — GLUCOSE, CAPILLARY: Glucose-Capillary: 122 mg/dL — ABNORMAL HIGH (ref 70–99)

## 2013-03-05 NOTE — H&P (Unsigned)
Brady Frazier, Brady Frazier              ACCOUNT NO.:  [id number]  MEDICAL RECORD NO.:  [id number]  LOCATION:                                 FACILITY:  PHYSICIAN:  Kingsley Callander. Ouida Sills, MD       DATE OF BIRTH:  1941/05/02  DATE OF ADMISSION:  02/26/2013 DATE OF DISCHARGE:  [facility]                             HISTORY & PHYSICAL   HISTORY OF PRESENT ILLNESS:  This patient is a 72 year old male who was [facility] after evaluation and treatment of a left middle cerebral artery stroke on May 10.  He was discharged on May 16th.  He was treated with tPA.  He developed a parenchymal hemorrhage.  He experienced neurologic decline.  He exhibits significant difficulties with speech.  He has a right hemiparesis.  He was evaluated with Physical Therapy, Occupational Therapy and Speech Therapy.  Skilled nursing transfer was recommended.  PAST MEDICAL HISTORY: 1. Diabetes. 2. Hypertension. 3. Chronic kidney disease. 4. Stroke. 5. Microalbuminuria. 6. Hyperlipidemia. 7. Hyperkalemia on lisinopril. 8. Patent foramen ovale.  MEDICATIONS:  Amlodipine 5 mg daily, Lipitor 20 mg daily, hydrochlorothiazide 25 mg daily, Glucerna b.i.d., and Lantus 25 units daily.  ALLERGIES:  Possibly shellfish.  SOCIAL HISTORY:  He does not use tobacco, alcohol, or recreational substances.  FAMILY HISTORY:  His father died at 77 of kidney failure.  His mother had diabetes.  Brothers had diabetes.  A sister died with a stroke.  Two sisters had diabetes.  REVIEW OF SYSTEMS:  Unobtainable.  PHYSICAL EXAMINATION:  GENERAL Alert, but unable to communicate effectively.  HEENT No scleral icterus.  Oropharynx is moist.  NECK Reveals a right carotid bruit.  LUNGS Clear.  HEART Regular with no murmurs.  ABDOMEN Soft, nontender with no palpable organomegaly.  EXTREMITIES No cyanosis, clubbing, or edema.  Pulses intact.  NEURO Consistent with a right hemi pareses.  SKIN Warm and  dry.  IMPRESSION/PLAN: 1. Left middle cerebral artery stroke, treated with tPA complicated by     intracerebral hemorrhage.  He has also had an MRI of the brain,     which revealed no proximal stenosis or aneurysm.  His echo revealed     an ejection fraction of 65% with no source of embolus.  He had a     TEE, which revealed a patent foramen ovale.  His carotid Doppler     study revealed no hemodynamically significant stenosis.     Recommendations from neurology have included no anti-platelet     agents at this time with consideration of low-dose aspirin at a     followup visit.  We will continued Speech Therapy, Occupational     Therapy, and Physical Therapy. 2. Diabetes.  Continue Lantus. 3. Hypertension.  Continue amlodipine and an hydrochlorothiazide. 4. Chronic kidney disease. 5. Hyperlipidemia.  Continue Lipitor. 6. Recent fevers.  Since transfer to the [facility], he has had     intermittent fevers to 101 range.  He will be further assessed with     a CBC, metabolic profile, urinalysis, urine culture, 2 sets of     blood cultures and a chest x-ray.  Kingsley Callander. Ouida Sills, MD     ROF/MEDQ  D:  03/04/2013  T:  03/04/2013  Job:  [id number]

## 2013-03-06 LAB — GLUCOSE, CAPILLARY: Glucose-Capillary: 76 mg/dL (ref 70–99)

## 2013-03-09 LAB — GLUCOSE, CAPILLARY: Glucose-Capillary: 143 mg/dL — ABNORMAL HIGH (ref 70–99)

## 2013-03-10 LAB — GLUCOSE, CAPILLARY: Glucose-Capillary: 151 mg/dL — ABNORMAL HIGH (ref 70–99)

## 2013-03-11 LAB — GLUCOSE, CAPILLARY: Glucose-Capillary: 113 mg/dL — ABNORMAL HIGH (ref 70–99)

## 2013-03-12 LAB — GLUCOSE, CAPILLARY: Glucose-Capillary: 97 mg/dL (ref 70–99)

## 2013-03-13 LAB — GLUCOSE, CAPILLARY

## 2013-03-15 LAB — GLUCOSE, CAPILLARY: Glucose-Capillary: 134 mg/dL — ABNORMAL HIGH (ref 70–99)

## 2013-03-16 LAB — GLUCOSE, CAPILLARY: Glucose-Capillary: 162 mg/dL — ABNORMAL HIGH (ref 70–99)

## 2013-03-17 LAB — GLUCOSE, CAPILLARY: Glucose-Capillary: 150 mg/dL — ABNORMAL HIGH (ref 70–99)

## 2013-03-20 LAB — GLUCOSE, CAPILLARY: Glucose-Capillary: 148 mg/dL — ABNORMAL HIGH (ref 70–99)

## 2013-03-21 LAB — GLUCOSE, CAPILLARY: Glucose-Capillary: 135 mg/dL — ABNORMAL HIGH (ref 70–99)

## 2013-03-22 LAB — GLUCOSE, CAPILLARY: Glucose-Capillary: 184 mg/dL — ABNORMAL HIGH (ref 70–99)

## 2013-03-23 LAB — GLUCOSE, CAPILLARY: Glucose-Capillary: 164 mg/dL — ABNORMAL HIGH (ref 70–99)

## 2013-03-25 LAB — GLUCOSE, CAPILLARY: Glucose-Capillary: 309 mg/dL — ABNORMAL HIGH (ref 70–99)

## 2013-03-26 LAB — GLUCOSE, CAPILLARY

## 2013-03-27 LAB — GLUCOSE, CAPILLARY: Glucose-Capillary: 172 mg/dL — ABNORMAL HIGH (ref 70–99)

## 2013-03-28 LAB — GLUCOSE, CAPILLARY

## 2013-03-29 LAB — GLUCOSE, CAPILLARY: Glucose-Capillary: 135 mg/dL — ABNORMAL HIGH (ref 70–99)

## 2013-03-30 LAB — GLUCOSE, CAPILLARY

## 2013-03-31 LAB — GLUCOSE, CAPILLARY
Glucose-Capillary: 62 mg/dL — ABNORMAL LOW (ref 70–99)
Glucose-Capillary: 90 mg/dL (ref 70–99)

## 2013-04-02 LAB — GLUCOSE, CAPILLARY: Glucose-Capillary: 90 mg/dL (ref 70–99)

## 2013-04-03 LAB — GLUCOSE, CAPILLARY

## 2013-04-04 LAB — GLUCOSE, CAPILLARY
Glucose-Capillary: 66 mg/dL — ABNORMAL LOW (ref 70–99)
Glucose-Capillary: 85 mg/dL (ref 70–99)

## 2013-04-05 LAB — GLUCOSE, CAPILLARY: Glucose-Capillary: 95 mg/dL (ref 70–99)

## 2013-04-06 LAB — GLUCOSE, CAPILLARY: Glucose-Capillary: 213 mg/dL — ABNORMAL HIGH (ref 70–99)

## 2013-04-08 LAB — GLUCOSE, CAPILLARY: Glucose-Capillary: 148 mg/dL — ABNORMAL HIGH (ref 70–99)

## 2013-04-09 LAB — GLUCOSE, CAPILLARY

## 2013-04-10 LAB — GLUCOSE, CAPILLARY
Glucose-Capillary: 63 mg/dL — ABNORMAL LOW (ref 70–99)
Glucose-Capillary: 70 mg/dL (ref 70–99)

## 2013-04-11 LAB — GLUCOSE, CAPILLARY
Glucose-Capillary: 103 mg/dL — ABNORMAL HIGH (ref 70–99)
Glucose-Capillary: 46 mg/dL — ABNORMAL LOW (ref 70–99)

## 2013-04-12 LAB — GLUCOSE, CAPILLARY: Glucose-Capillary: 73 mg/dL (ref 70–99)

## 2013-04-13 LAB — GLUCOSE, CAPILLARY: Glucose-Capillary: 100 mg/dL — ABNORMAL HIGH (ref 70–99)

## 2013-04-14 LAB — GLUCOSE, CAPILLARY
Glucose-Capillary: 133 mg/dL — ABNORMAL HIGH (ref 70–99)
Glucose-Capillary: 182 mg/dL — ABNORMAL HIGH (ref 70–99)

## 2013-04-15 LAB — GLUCOSE, CAPILLARY
Glucose-Capillary: 113 mg/dL — ABNORMAL HIGH (ref 70–99)
Glucose-Capillary: 217 mg/dL — ABNORMAL HIGH (ref 70–99)

## 2013-04-17 LAB — GLUCOSE, CAPILLARY: Glucose-Capillary: 64 mg/dL — ABNORMAL LOW (ref 70–99)

## 2013-04-18 LAB — GLUCOSE, CAPILLARY
Glucose-Capillary: 148 mg/dL — ABNORMAL HIGH (ref 70–99)
Glucose-Capillary: 174 mg/dL — ABNORMAL HIGH (ref 70–99)

## 2013-04-19 LAB — GLUCOSE, CAPILLARY: Glucose-Capillary: 63 mg/dL — ABNORMAL LOW (ref 70–99)

## 2013-04-20 LAB — GLUCOSE, CAPILLARY: Glucose-Capillary: 114 mg/dL — ABNORMAL HIGH (ref 70–99)

## 2013-04-21 LAB — GLUCOSE, CAPILLARY
Glucose-Capillary: 109 mg/dL — ABNORMAL HIGH (ref 70–99)
Glucose-Capillary: 204 mg/dL — ABNORMAL HIGH (ref 70–99)

## 2013-04-22 LAB — GLUCOSE, CAPILLARY

## 2013-04-23 LAB — GLUCOSE, CAPILLARY

## 2013-04-24 LAB — GLUCOSE, CAPILLARY
Glucose-Capillary: 217 mg/dL — ABNORMAL HIGH (ref 70–99)
Glucose-Capillary: 95 mg/dL (ref 70–99)

## 2013-04-25 LAB — GLUCOSE, CAPILLARY: Glucose-Capillary: 176 mg/dL — ABNORMAL HIGH (ref 70–99)

## 2013-04-27 LAB — GLUCOSE, CAPILLARY: Glucose-Capillary: 105 mg/dL — ABNORMAL HIGH (ref 70–99)

## 2013-04-28 LAB — GLUCOSE, CAPILLARY: Glucose-Capillary: 68 mg/dL — ABNORMAL LOW (ref 70–99)

## 2013-04-30 LAB — GLUCOSE, CAPILLARY: Glucose-Capillary: 108 mg/dL — ABNORMAL HIGH (ref 70–99)

## 2013-05-01 LAB — GLUCOSE, CAPILLARY: Glucose-Capillary: 163 mg/dL — ABNORMAL HIGH (ref 70–99)

## 2013-05-02 LAB — GLUCOSE, CAPILLARY: Glucose-Capillary: 139 mg/dL — ABNORMAL HIGH (ref 70–99)

## 2013-05-03 LAB — GLUCOSE, CAPILLARY: Glucose-Capillary: 105 mg/dL — ABNORMAL HIGH (ref 70–99)

## 2013-05-04 LAB — GLUCOSE, CAPILLARY
Glucose-Capillary: 189 mg/dL — ABNORMAL HIGH (ref 70–99)
Glucose-Capillary: 273 mg/dL — ABNORMAL HIGH (ref 70–99)

## 2013-05-05 LAB — GLUCOSE, CAPILLARY
Glucose-Capillary: 116 mg/dL — ABNORMAL HIGH (ref 70–99)
Glucose-Capillary: 212 mg/dL — ABNORMAL HIGH (ref 70–99)

## 2013-05-06 LAB — GLUCOSE, CAPILLARY: Glucose-Capillary: 178 mg/dL — ABNORMAL HIGH (ref 70–99)

## 2013-05-07 LAB — GLUCOSE, CAPILLARY: Glucose-Capillary: 169 mg/dL — ABNORMAL HIGH (ref 70–99)

## 2013-05-08 LAB — GLUCOSE, CAPILLARY: Glucose-Capillary: 156 mg/dL — ABNORMAL HIGH (ref 70–99)

## 2013-05-09 LAB — GLUCOSE, CAPILLARY
Glucose-Capillary: 372 mg/dL — ABNORMAL HIGH (ref 70–99)
Glucose-Capillary: 267 mg/dL — ABNORMAL HIGH (ref 70–99)
Glucose-Capillary: 118 mg/dL — ABNORMAL HIGH (ref 70–99)

## 2013-05-10 LAB — GLUCOSE, CAPILLARY: Glucose-Capillary: 258 mg/dL — ABNORMAL HIGH (ref 70–99)

## 2013-05-11 LAB — GLUCOSE, CAPILLARY: Glucose-Capillary: 192 mg/dL — ABNORMAL HIGH (ref 70–99)

## 2013-05-12 LAB — GLUCOSE, CAPILLARY
Glucose-Capillary: 115 mg/dL — ABNORMAL HIGH (ref 70–99)
Glucose-Capillary: 281 mg/dL — ABNORMAL HIGH (ref 70–99)

## 2013-05-13 LAB — GLUCOSE, CAPILLARY
Glucose-Capillary: 100 mg/dL — ABNORMAL HIGH (ref 70–99)
Glucose-Capillary: 228 mg/dL — ABNORMAL HIGH (ref 70–99)

## 2013-05-14 LAB — GLUCOSE, CAPILLARY

## 2013-05-15 LAB — GLUCOSE, CAPILLARY: Glucose-Capillary: 128 mg/dL — ABNORMAL HIGH (ref 70–99)

## 2013-05-16 LAB — GLUCOSE, CAPILLARY
Glucose-Capillary: 84 mg/dL (ref 70–99)
Glucose-Capillary: 146 mg/dL — ABNORMAL HIGH (ref 70–99)

## 2013-05-18 LAB — GLUCOSE, CAPILLARY: Glucose-Capillary: 197 mg/dL — ABNORMAL HIGH (ref 70–99)

## 2013-05-19 LAB — GLUCOSE, CAPILLARY: Glucose-Capillary: 158 mg/dL — ABNORMAL HIGH (ref 70–99)

## 2013-05-20 LAB — GLUCOSE, CAPILLARY: Glucose-Capillary: 105 mg/dL — ABNORMAL HIGH (ref 70–99)

## 2013-05-21 LAB — GLUCOSE, CAPILLARY: Glucose-Capillary: 154 mg/dL — ABNORMAL HIGH (ref 70–99)

## 2013-05-22 LAB — GLUCOSE, CAPILLARY: Glucose-Capillary: 281 mg/dL — ABNORMAL HIGH (ref 70–99)

## 2013-05-23 LAB — GLUCOSE, CAPILLARY
Glucose-Capillary: 106 mg/dL — ABNORMAL HIGH (ref 70–99)
Glucose-Capillary: 229 mg/dL — ABNORMAL HIGH (ref 70–99)

## 2013-05-24 LAB — GLUCOSE, CAPILLARY: Glucose-Capillary: 110 mg/dL — ABNORMAL HIGH (ref 70–99)

## 2013-05-25 LAB — GLUCOSE, CAPILLARY

## 2013-05-26 LAB — GLUCOSE, CAPILLARY: Glucose-Capillary: 140 mg/dL — ABNORMAL HIGH (ref 70–99)

## 2013-05-27 LAB — GLUCOSE, CAPILLARY
Glucose-Capillary: 65 mg/dL — ABNORMAL LOW (ref 70–99)
Glucose-Capillary: 69 mg/dL — ABNORMAL LOW (ref 70–99)

## 2013-05-28 LAB — GLUCOSE, CAPILLARY: Glucose-Capillary: 230 mg/dL — ABNORMAL HIGH (ref 70–99)

## 2013-05-29 LAB — GLUCOSE, CAPILLARY: Glucose-Capillary: 163 mg/dL — ABNORMAL HIGH (ref 70–99)

## 2013-05-30 LAB — GLUCOSE, CAPILLARY: Glucose-Capillary: 122 mg/dL — ABNORMAL HIGH (ref 70–99)

## 2013-05-31 LAB — GLUCOSE, CAPILLARY: Glucose-Capillary: 86 mg/dL (ref 70–99)

## 2013-06-01 LAB — GLUCOSE, CAPILLARY
Glucose-Capillary: 96 mg/dL (ref 70–99)
Glucose-Capillary: 85 mg/dL (ref 70–99)
Glucose-Capillary: 259 mg/dL — ABNORMAL HIGH (ref 70–99)

## 2013-06-02 LAB — GLUCOSE, CAPILLARY: Glucose-Capillary: 108 mg/dL — ABNORMAL HIGH (ref 70–99)

## 2013-06-03 LAB — GLUCOSE, CAPILLARY: Glucose-Capillary: 109 mg/dL — ABNORMAL HIGH (ref 70–99)

## 2013-06-04 LAB — GLUCOSE, CAPILLARY
Glucose-Capillary: 97 mg/dL (ref 70–99)
Glucose-Capillary: 156 mg/dL — ABNORMAL HIGH (ref 70–99)

## 2013-06-05 LAB — GLUCOSE, CAPILLARY
Glucose-Capillary: 124 mg/dL — ABNORMAL HIGH (ref 70–99)
Glucose-Capillary: 267 mg/dL — ABNORMAL HIGH (ref 70–99)

## 2013-06-06 LAB — GLUCOSE, CAPILLARY: Glucose-Capillary: 115 mg/dL — ABNORMAL HIGH (ref 70–99)

## 2013-06-07 LAB — GLUCOSE, CAPILLARY: Glucose-Capillary: 101 mg/dL — ABNORMAL HIGH (ref 70–99)

## 2013-06-08 LAB — GLUCOSE, CAPILLARY
Glucose-Capillary: 74 mg/dL (ref 70–99)
Glucose-Capillary: 218 mg/dL — ABNORMAL HIGH (ref 70–99)

## 2013-06-09 LAB — GLUCOSE, CAPILLARY: Glucose-Capillary: 111 mg/dL — ABNORMAL HIGH (ref 70–99)

## 2013-06-10 LAB — GLUCOSE, CAPILLARY: Glucose-Capillary: 227 mg/dL — ABNORMAL HIGH (ref 70–99)

## 2013-06-11 LAB — GLUCOSE, CAPILLARY
Glucose-Capillary: 90 mg/dL (ref 70–99)
Glucose-Capillary: 258 mg/dL — ABNORMAL HIGH (ref 70–99)

## 2013-06-13 LAB — GLUCOSE, CAPILLARY
Glucose-Capillary: 89 mg/dL (ref 70–99)
Glucose-Capillary: 226 mg/dL — ABNORMAL HIGH (ref 70–99)

## 2013-06-14 LAB — GLUCOSE, CAPILLARY: Glucose-Capillary: 246 mg/dL — ABNORMAL HIGH (ref 70–99)

## 2013-06-15 LAB — GLUCOSE, CAPILLARY: Glucose-Capillary: 92 mg/dL (ref 70–99)

## 2013-06-17 LAB — GLUCOSE, CAPILLARY: Glucose-Capillary: 198 mg/dL — ABNORMAL HIGH (ref 70–99)

## 2013-06-18 LAB — GLUCOSE, CAPILLARY: Glucose-Capillary: 279 mg/dL — ABNORMAL HIGH (ref 70–99)

## 2013-06-21 LAB — GLUCOSE, CAPILLARY
Glucose-Capillary: 106 mg/dL — ABNORMAL HIGH (ref 70–99)
Glucose-Capillary: 262 mg/dL — ABNORMAL HIGH (ref 70–99)

## 2013-06-22 LAB — GLUCOSE, CAPILLARY: Glucose-Capillary: 207 mg/dL — ABNORMAL HIGH (ref 70–99)

## 2013-06-23 LAB — GLUCOSE, CAPILLARY: Glucose-Capillary: 143 mg/dL — ABNORMAL HIGH (ref 70–99)

## 2013-06-24 LAB — GLUCOSE, CAPILLARY: Glucose-Capillary: 158 mg/dL — ABNORMAL HIGH (ref 70–99)

## 2013-06-26 LAB — GLUCOSE, CAPILLARY
Glucose-Capillary: 91 mg/dL (ref 70–99)
Glucose-Capillary: 231 mg/dL — ABNORMAL HIGH (ref 70–99)

## 2013-06-27 LAB — GLUCOSE, CAPILLARY: Glucose-Capillary: 159 mg/dL — ABNORMAL HIGH (ref 70–99)

## 2013-06-29 LAB — GLUCOSE, CAPILLARY: Glucose-Capillary: 268 mg/dL — ABNORMAL HIGH (ref 70–99)

## 2013-06-30 LAB — GLUCOSE, CAPILLARY
Glucose-Capillary: 102 mg/dL — ABNORMAL HIGH (ref 70–99)
Glucose-Capillary: 302 mg/dL — ABNORMAL HIGH (ref 70–99)

## 2013-07-03 LAB — GLUCOSE, CAPILLARY
Glucose-Capillary: 93 mg/dL (ref 70–99)
Glucose-Capillary: 279 mg/dL — ABNORMAL HIGH (ref 70–99)

## 2013-07-04 LAB — GLUCOSE, CAPILLARY
Glucose-Capillary: 100 mg/dL — ABNORMAL HIGH (ref 70–99)
Glucose-Capillary: 187 mg/dL — ABNORMAL HIGH (ref 70–99)

## 2013-07-05 LAB — GLUCOSE, CAPILLARY
Glucose-Capillary: 103 mg/dL — ABNORMAL HIGH (ref 70–99)
Glucose-Capillary: 151 mg/dL — ABNORMAL HIGH (ref 70–99)

## 2013-07-06 LAB — GLUCOSE, CAPILLARY
Glucose-Capillary: 103 mg/dL — ABNORMAL HIGH (ref 70–99)
Glucose-Capillary: 312 mg/dL — ABNORMAL HIGH (ref 70–99)

## 2013-07-07 LAB — GLUCOSE, CAPILLARY: Glucose-Capillary: 130 mg/dL — ABNORMAL HIGH (ref 70–99)

## 2013-07-08 LAB — GLUCOSE, CAPILLARY: Glucose-Capillary: 250 mg/dL — ABNORMAL HIGH (ref 70–99)

## 2013-07-09 LAB — GLUCOSE, CAPILLARY
Glucose-Capillary: 89 mg/dL (ref 70–99)
Glucose-Capillary: 230 mg/dL — ABNORMAL HIGH (ref 70–99)

## 2013-07-10 LAB — GLUCOSE, CAPILLARY: Glucose-Capillary: 267 mg/dL — ABNORMAL HIGH (ref 70–99)

## 2013-07-13 LAB — GLUCOSE, CAPILLARY
Glucose-Capillary: 95 mg/dL (ref 70–99)
Glucose-Capillary: 193 mg/dL — ABNORMAL HIGH (ref 70–99)

## 2013-07-14 LAB — GLUCOSE, CAPILLARY
Glucose-Capillary: 89 mg/dL (ref 70–99)
Glucose-Capillary: 209 mg/dL — ABNORMAL HIGH (ref 70–99)

## 2013-07-15 LAB — GLUCOSE, CAPILLARY: Glucose-Capillary: 78 mg/dL (ref 70–99)

## 2013-07-16 LAB — GLUCOSE, CAPILLARY
Glucose-Capillary: 65 mg/dL — ABNORMAL LOW (ref 70–99)
Glucose-Capillary: 91 mg/dL (ref 70–99)
Glucose-Capillary: 202 mg/dL — ABNORMAL HIGH (ref 70–99)

## 2013-07-17 LAB — GLUCOSE, CAPILLARY: Glucose-Capillary: 80 mg/dL (ref 70–99)

## 2013-07-18 LAB — GLUCOSE, CAPILLARY

## 2013-07-19 LAB — GLUCOSE, CAPILLARY
Glucose-Capillary: 205 mg/dL — ABNORMAL HIGH (ref 70–99)
Glucose-Capillary: 203 mg/dL — ABNORMAL HIGH (ref 70–99)

## 2013-07-20 ENCOUNTER — Ambulatory Visit (HOSPITAL_COMMUNITY): Payer: Medicare HMO | Attending: Internal Medicine

## 2013-07-20 DIAGNOSIS — M25569 Pain in unspecified knee: Secondary | ICD-10-CM | POA: Insufficient documentation

## 2013-07-21 LAB — GLUCOSE, CAPILLARY: Glucose-Capillary: 176 mg/dL — ABNORMAL HIGH (ref 70–99)

## 2013-07-22 LAB — GLUCOSE, CAPILLARY
Glucose-Capillary: 86 mg/dL (ref 70–99)
Glucose-Capillary: 111 mg/dL — ABNORMAL HIGH (ref 70–99)

## 2013-07-23 LAB — GLUCOSE, CAPILLARY
Glucose-Capillary: 80 mg/dL (ref 70–99)
Glucose-Capillary: 320 mg/dL — ABNORMAL HIGH (ref 70–99)

## 2013-07-24 LAB — GLUCOSE, CAPILLARY: Glucose-Capillary: 98 mg/dL (ref 70–99)

## 2013-07-25 LAB — GLUCOSE, CAPILLARY
Glucose-Capillary: 81 mg/dL (ref 70–99)
Glucose-Capillary: 68 mg/dL — ABNORMAL LOW (ref 70–99)

## 2013-07-26 LAB — GLUCOSE, CAPILLARY
Glucose-Capillary: 59 mg/dL — ABNORMAL LOW (ref 70–99)
Glucose-Capillary: 75 mg/dL (ref 70–99)

## 2013-07-26 NOTE — Progress Notes (Unsigned)
Brady Frazier, Brady Frazier              ACCOUNT NO.:  [id number]  MEDICAL RECORD NO.:  [id number]  LOCATION:                                 FACILITY:  PHYSICIAN:  Kingsley Callander. Ouida Sills, MD       DATE OF BIRTH:  Sep 20, 1941  DATE OF PROCEDURE:  07/23/2013 DATE OF DISCHARGE:                                PROGRESS NOTE   SUBJECTIVE:  Brady Frazier was noted by the staff to have tenderness in his left knee.  There is no report of injury to the knee.  There is felt to have been an abrasion present over the patella warrant.  On exam, there is no inflammatory change present of the knee.  He has diminished range of motion.  There is no effusion.  He has been stable from a stroke standpoint.  He is able to swallow.  He has had no signs of recurrent stroke.  He has had a recent lipid panel revealing a cholesterol of 119 with an LDL of 65, HDL 37, and triglycerides of 86.  He has been stable from a diabetes standpoint, his hemoglobin A1c was 5.9 in August.  He has chronic kidney disease with a BUN and creatinine of 30 and 2.2.  Latest potassium was 3.6, hemoglobin 9.6.  OBJECTIVE:  GENERAL:  He is alert and comfortable appearing.  He speaks minimally. LUNGS:  Clear. HEART:  Regular with no murmurs. ABDOMEN:  Soft and nontender. EXTREMITIES:  Reveal no edema.  IMPRESSION/PLAN: 1. Congestive left knee pain, degenerative joint disease, tramadol     t.i.d. p.r.n. 2. Stroke.  Continue amlodipine, HCTZ, and Lipitor. 3. Diabetes.  Continue Lantus.     Kingsley Callander. Ouida Sills, MD     ROF/MEDQ  D:  07/23/2013  T:  07/24/2013  Job:  [id number]

## 2013-07-27 LAB — GLUCOSE, CAPILLARY
Glucose-Capillary: 94 mg/dL (ref 70–99)
Glucose-Capillary: 216 mg/dL — ABNORMAL HIGH (ref 70–99)

## 2013-07-28 LAB — GLUCOSE, CAPILLARY
Glucose-Capillary: 88 mg/dL (ref 70–99)
Glucose-Capillary: 134 mg/dL — ABNORMAL HIGH (ref 70–99)

## 2013-07-30 LAB — GLUCOSE, CAPILLARY: Glucose-Capillary: 203 mg/dL — ABNORMAL HIGH (ref 70–99)

## 2013-07-31 LAB — GLUCOSE, CAPILLARY
Glucose-Capillary: 95 mg/dL (ref 70–99)
Glucose-Capillary: 176 mg/dL — ABNORMAL HIGH (ref 70–99)

## 2013-08-02 LAB — GLUCOSE, CAPILLARY: Glucose-Capillary: 161 mg/dL — ABNORMAL HIGH (ref 70–99)

## 2013-08-03 LAB — GLUCOSE, CAPILLARY: Glucose-Capillary: 227 mg/dL — ABNORMAL HIGH (ref 70–99)

## 2013-08-04 LAB — GLUCOSE, CAPILLARY
Glucose-Capillary: 133 mg/dL — ABNORMAL HIGH (ref 70–99)
Glucose-Capillary: 235 mg/dL — ABNORMAL HIGH (ref 70–99)

## 2013-08-05 LAB — GLUCOSE, CAPILLARY: Glucose-Capillary: 83 mg/dL (ref 70–99)

## 2013-08-06 LAB — GLUCOSE, CAPILLARY: Glucose-Capillary: 197 mg/dL — ABNORMAL HIGH (ref 70–99)

## 2013-08-07 LAB — GLUCOSE, CAPILLARY: Glucose-Capillary: 89 mg/dL (ref 70–99)

## 2013-08-08 LAB — GLUCOSE, CAPILLARY: Glucose-Capillary: 293 mg/dL — ABNORMAL HIGH (ref 70–99)

## 2013-08-09 LAB — GLUCOSE, CAPILLARY: Glucose-Capillary: 162 mg/dL — ABNORMAL HIGH (ref 70–99)

## 2013-08-10 LAB — GLUCOSE, CAPILLARY
Glucose-Capillary: 94 mg/dL (ref 70–99)
Glucose-Capillary: 180 mg/dL — ABNORMAL HIGH (ref 70–99)

## 2013-08-11 LAB — GLUCOSE, CAPILLARY
Glucose-Capillary: 92 mg/dL (ref 70–99)
Glucose-Capillary: 273 mg/dL — ABNORMAL HIGH (ref 70–99)

## 2013-08-12 LAB — GLUCOSE, CAPILLARY: Glucose-Capillary: 81 mg/dL (ref 70–99)

## 2013-08-13 LAB — GLUCOSE, CAPILLARY: Glucose-Capillary: 82 mg/dL (ref 70–99)

## 2013-08-14 LAB — GLUCOSE, CAPILLARY
Glucose-Capillary: 129 mg/dL — ABNORMAL HIGH (ref 70–99)
Glucose-Capillary: 174 mg/dL — ABNORMAL HIGH (ref 70–99)

## 2013-08-15 LAB — GLUCOSE, CAPILLARY: Glucose-Capillary: 231 mg/dL — ABNORMAL HIGH (ref 70–99)

## 2013-08-16 LAB — GLUCOSE, CAPILLARY: Glucose-Capillary: 263 mg/dL — ABNORMAL HIGH (ref 70–99)

## 2013-08-17 LAB — GLUCOSE, CAPILLARY
Glucose-Capillary: 138 mg/dL — ABNORMAL HIGH (ref 70–99)
Glucose-Capillary: 248 mg/dL — ABNORMAL HIGH (ref 70–99)

## 2013-08-18 LAB — GLUCOSE, CAPILLARY
Glucose-Capillary: 132 mg/dL — ABNORMAL HIGH (ref 70–99)
Glucose-Capillary: 171 mg/dL — ABNORMAL HIGH (ref 70–99)

## 2013-08-19 LAB — GLUCOSE, CAPILLARY

## 2013-08-20 LAB — GLUCOSE, CAPILLARY
Glucose-Capillary: 86 mg/dL (ref 70–99)
Glucose-Capillary: 262 mg/dL — ABNORMAL HIGH (ref 70–99)

## 2013-08-21 LAB — GLUCOSE, CAPILLARY: Glucose-Capillary: 89 mg/dL (ref 70–99)

## 2013-08-22 LAB — GLUCOSE, CAPILLARY: Glucose-Capillary: 149 mg/dL — ABNORMAL HIGH (ref 70–99)

## 2013-08-23 LAB — GLUCOSE, CAPILLARY: Glucose-Capillary: 277 mg/dL — ABNORMAL HIGH (ref 70–99)

## 2013-08-24 LAB — GLUCOSE, CAPILLARY
Glucose-Capillary: 110 mg/dL — ABNORMAL HIGH (ref 70–99)
Glucose-Capillary: 199 mg/dL — ABNORMAL HIGH (ref 70–99)

## 2013-08-25 LAB — GLUCOSE, CAPILLARY: Glucose-Capillary: 98 mg/dL (ref 70–99)

## 2013-08-26 LAB — GLUCOSE, CAPILLARY: Glucose-Capillary: 159 mg/dL — ABNORMAL HIGH (ref 70–99)

## 2013-08-27 LAB — GLUCOSE, CAPILLARY
Glucose-Capillary: 77 mg/dL (ref 70–99)
Glucose-Capillary: 256 mg/dL — ABNORMAL HIGH (ref 70–99)

## 2013-08-29 LAB — GLUCOSE, CAPILLARY: Glucose-Capillary: 192 mg/dL — ABNORMAL HIGH (ref 70–99)

## 2013-08-30 LAB — GLUCOSE, CAPILLARY: Glucose-Capillary: 73 mg/dL (ref 70–99)

## 2013-08-31 LAB — GLUCOSE, CAPILLARY
Glucose-Capillary: 189 mg/dL — ABNORMAL HIGH (ref 70–99)
Glucose-Capillary: 135 mg/dL — ABNORMAL HIGH (ref 70–99)
Glucose-Capillary: 191 mg/dL — ABNORMAL HIGH (ref 70–99)

## 2013-09-01 LAB — GLUCOSE, CAPILLARY

## 2013-09-02 LAB — GLUCOSE, CAPILLARY: Glucose-Capillary: 187 mg/dL — ABNORMAL HIGH (ref 70–99)

## 2013-09-03 LAB — GLUCOSE, CAPILLARY: Glucose-Capillary: 184 mg/dL — ABNORMAL HIGH (ref 70–99)

## 2013-09-05 LAB — GLUCOSE, CAPILLARY: Glucose-Capillary: 120 mg/dL — ABNORMAL HIGH (ref 70–99)

## 2013-09-06 LAB — GLUCOSE, CAPILLARY: Glucose-Capillary: 82 mg/dL (ref 70–99)

## 2013-09-07 LAB — GLUCOSE, CAPILLARY
Glucose-Capillary: 104 mg/dL — ABNORMAL HIGH (ref 70–99)
Glucose-Capillary: 239 mg/dL — ABNORMAL HIGH (ref 70–99)

## 2013-09-08 LAB — GLUCOSE, CAPILLARY: Glucose-Capillary: 73 mg/dL (ref 70–99)

## 2013-09-09 LAB — GLUCOSE, CAPILLARY: Glucose-Capillary: 197 mg/dL — ABNORMAL HIGH (ref 70–99)

## 2013-09-10 LAB — GLUCOSE, CAPILLARY: Glucose-Capillary: 74 mg/dL (ref 70–99)

## 2013-09-11 LAB — GLUCOSE, CAPILLARY: Glucose-Capillary: 109 mg/dL — ABNORMAL HIGH (ref 70–99)

## 2013-09-12 LAB — GLUCOSE, CAPILLARY
Glucose-Capillary: 61 mg/dL — ABNORMAL LOW (ref 70–99)
Glucose-Capillary: 68 mg/dL — ABNORMAL LOW (ref 70–99)
Glucose-Capillary: 225 mg/dL — ABNORMAL HIGH (ref 70–99)

## 2013-09-13 LAB — GLUCOSE, CAPILLARY
Glucose-Capillary: 55 mg/dL — ABNORMAL LOW (ref 70–99)
Glucose-Capillary: 87 mg/dL (ref 70–99)

## 2013-09-14 LAB — GLUCOSE, CAPILLARY
Glucose-Capillary: 116 mg/dL — ABNORMAL HIGH (ref 70–99)
Glucose-Capillary: 213 mg/dL — ABNORMAL HIGH (ref 70–99)

## 2013-09-15 LAB — GLUCOSE, CAPILLARY: Glucose-Capillary: 71 mg/dL (ref 70–99)

## 2013-09-16 LAB — GLUCOSE, CAPILLARY
Glucose-Capillary: 88 mg/dL (ref 70–99)
Glucose-Capillary: 220 mg/dL — ABNORMAL HIGH (ref 70–99)

## 2013-09-17 LAB — GLUCOSE, CAPILLARY
Glucose-Capillary: 85 mg/dL (ref 70–99)
Glucose-Capillary: 262 mg/dL — ABNORMAL HIGH (ref 70–99)

## 2013-09-18 LAB — GLUCOSE, CAPILLARY: Glucose-Capillary: 174 mg/dL — ABNORMAL HIGH (ref 70–99)

## 2013-09-20 LAB — GLUCOSE, CAPILLARY
Glucose-Capillary: 112 mg/dL — ABNORMAL HIGH (ref 70–99)
Glucose-Capillary: 179 mg/dL — ABNORMAL HIGH (ref 70–99)

## 2013-09-21 LAB — GLUCOSE, CAPILLARY

## 2013-09-22 LAB — GLUCOSE, CAPILLARY: Glucose-Capillary: 109 mg/dL — ABNORMAL HIGH (ref 70–99)

## 2013-09-23 LAB — GLUCOSE, CAPILLARY
Glucose-Capillary: 132 mg/dL — ABNORMAL HIGH (ref 70–99)
Glucose-Capillary: 228 mg/dL — ABNORMAL HIGH (ref 70–99)

## 2013-09-24 LAB — GLUCOSE, CAPILLARY: Glucose-Capillary: 234 mg/dL — ABNORMAL HIGH (ref 70–99)

## 2013-09-26 LAB — GLUCOSE, CAPILLARY
Glucose-Capillary: 106 mg/dL — ABNORMAL HIGH (ref 70–99)
Glucose-Capillary: 326 mg/dL — ABNORMAL HIGH (ref 70–99)

## 2013-09-27 LAB — GLUCOSE, CAPILLARY
Glucose-Capillary: 352 mg/dL — ABNORMAL HIGH (ref 70–99)
Glucose-Capillary: 99 mg/dL (ref 70–99)

## 2013-09-28 LAB — GLUCOSE, CAPILLARY: Glucose-Capillary: 105 mg/dL — ABNORMAL HIGH (ref 70–99)

## 2013-09-29 LAB — GLUCOSE, CAPILLARY

## 2013-09-30 LAB — GLUCOSE, CAPILLARY
Glucose-Capillary: 176 mg/dL — ABNORMAL HIGH (ref 70–99)
Glucose-Capillary: 292 mg/dL — ABNORMAL HIGH (ref 70–99)

## 2013-10-01 LAB — GLUCOSE, CAPILLARY
Glucose-Capillary: 96 mg/dL (ref 70–99)
Glucose-Capillary: 134 mg/dL — ABNORMAL HIGH (ref 70–99)

## 2013-10-02 LAB — GLUCOSE, CAPILLARY: Glucose-Capillary: 108 mg/dL — ABNORMAL HIGH (ref 70–99)

## 2013-10-04 LAB — GLUCOSE, CAPILLARY: Glucose-Capillary: 158 mg/dL — ABNORMAL HIGH (ref 70–99)

## 2013-10-06 LAB — GLUCOSE, CAPILLARY
Glucose-Capillary: 66 mg/dL — ABNORMAL LOW (ref 70–99)
Glucose-Capillary: 117 mg/dL — ABNORMAL HIGH (ref 70–99)
Glucose-Capillary: 75 mg/dL (ref 70–99)
Glucose-Capillary: 154 mg/dL — ABNORMAL HIGH (ref 70–99)

## 2013-10-07 LAB — GLUCOSE, CAPILLARY: Glucose-Capillary: 225 mg/dL — ABNORMAL HIGH (ref 70–99)

## 2013-10-08 LAB — GLUCOSE, CAPILLARY
Glucose-Capillary: 51 mg/dL — ABNORMAL LOW (ref 70–99)
Glucose-Capillary: 66 mg/dL — ABNORMAL LOW (ref 70–99)

## 2013-10-09 LAB — GLUCOSE, CAPILLARY
Glucose-Capillary: 119 mg/dL — ABNORMAL HIGH (ref 70–99)
Glucose-Capillary: 233 mg/dL — ABNORMAL HIGH (ref 70–99)

## 2013-10-10 LAB — GLUCOSE, CAPILLARY

## 2013-10-11 LAB — GLUCOSE, CAPILLARY
Glucose-Capillary: 94 mg/dL (ref 70–99)
Glucose-Capillary: 231 mg/dL — ABNORMAL HIGH (ref 70–99)

## 2013-10-12 LAB — GLUCOSE, CAPILLARY: Glucose-Capillary: 138 mg/dL — ABNORMAL HIGH (ref 70–99)

## 2013-10-13 LAB — GLUCOSE, CAPILLARY: Glucose-Capillary: 114 mg/dL — ABNORMAL HIGH (ref 70–99)

## 2013-10-14 LAB — GLUCOSE, CAPILLARY
Glucose-Capillary: 126 mg/dL — ABNORMAL HIGH (ref 70–99)
Glucose-Capillary: 167 mg/dL — ABNORMAL HIGH (ref 70–99)

## 2013-10-15 LAB — GLUCOSE, CAPILLARY
GLUCOSE-CAPILLARY: 120 mg/dL — AB (ref 70–99)
Glucose-Capillary: 175 mg/dL — ABNORMAL HIGH (ref 70–99)

## 2013-10-16 LAB — GLUCOSE, CAPILLARY
GLUCOSE-CAPILLARY: 117 mg/dL — AB (ref 70–99)
Glucose-Capillary: 204 mg/dL — ABNORMAL HIGH (ref 70–99)

## 2013-10-17 LAB — GLUCOSE, CAPILLARY
GLUCOSE-CAPILLARY: 77 mg/dL (ref 70–99)
Glucose-Capillary: 130 mg/dL — ABNORMAL HIGH (ref 70–99)

## 2013-10-18 LAB — GLUCOSE, CAPILLARY
Glucose-Capillary: 147 mg/dL — ABNORMAL HIGH (ref 70–99)
Glucose-Capillary: 165 mg/dL — ABNORMAL HIGH (ref 70–99)

## 2013-10-19 LAB — GLUCOSE, CAPILLARY
GLUCOSE-CAPILLARY: 153 mg/dL — AB (ref 70–99)
Glucose-Capillary: 247 mg/dL — ABNORMAL HIGH (ref 70–99)

## 2013-10-20 LAB — GLUCOSE, CAPILLARY
Glucose-Capillary: 129 mg/dL — ABNORMAL HIGH (ref 70–99)
Glucose-Capillary: 251 mg/dL — ABNORMAL HIGH (ref 70–99)

## 2013-10-21 LAB — GLUCOSE, CAPILLARY: Glucose-Capillary: 147 mg/dL — ABNORMAL HIGH (ref 70–99)

## 2013-10-22 LAB — GLUCOSE, CAPILLARY
GLUCOSE-CAPILLARY: 286 mg/dL — AB (ref 70–99)
Glucose-Capillary: 162 mg/dL — ABNORMAL HIGH (ref 70–99)
Glucose-Capillary: 159 mg/dL — ABNORMAL HIGH (ref 70–99)

## 2013-10-23 LAB — GLUCOSE, CAPILLARY
GLUCOSE-CAPILLARY: 133 mg/dL — AB (ref 70–99)
Glucose-Capillary: 283 mg/dL — ABNORMAL HIGH (ref 70–99)

## 2013-10-24 LAB — GLUCOSE, CAPILLARY
Glucose-Capillary: 94 mg/dL (ref 70–99)
Glucose-Capillary: 131 mg/dL — ABNORMAL HIGH (ref 70–99)

## 2013-10-25 LAB — GLUCOSE, CAPILLARY
Glucose-Capillary: 96 mg/dL (ref 70–99)
Glucose-Capillary: 250 mg/dL — ABNORMAL HIGH (ref 70–99)

## 2013-10-26 LAB — GLUCOSE, CAPILLARY: GLUCOSE-CAPILLARY: 142 mg/dL — AB (ref 70–99)

## 2013-10-27 LAB — GLUCOSE, CAPILLARY: GLUCOSE-CAPILLARY: 99 mg/dL (ref 70–99)

## 2013-10-28 LAB — GLUCOSE, CAPILLARY
GLUCOSE-CAPILLARY: 207 mg/dL — AB (ref 70–99)
Glucose-Capillary: 250 mg/dL — ABNORMAL HIGH (ref 70–99)
Glucose-Capillary: 128 mg/dL — ABNORMAL HIGH (ref 70–99)

## 2013-10-29 ENCOUNTER — Ambulatory Visit (HOSPITAL_COMMUNITY): Payer: Medicare HMO | Attending: Internal Medicine

## 2013-10-29 DIAGNOSIS — M25539 Pain in unspecified wrist: Secondary | ICD-10-CM | POA: Insufficient documentation

## 2013-10-29 DIAGNOSIS — M19049 Primary osteoarthritis, unspecified hand: Secondary | ICD-10-CM | POA: Insufficient documentation

## 2013-10-29 DIAGNOSIS — M25439 Effusion, unspecified wrist: Secondary | ICD-10-CM | POA: Insufficient documentation

## 2013-10-29 DIAGNOSIS — M79609 Pain in unspecified limb: Secondary | ICD-10-CM | POA: Insufficient documentation

## 2013-10-29 LAB — GLUCOSE, CAPILLARY
GLUCOSE-CAPILLARY: 52 mg/dL — AB (ref 70–99)
GLUCOSE-CAPILLARY: 64 mg/dL — AB (ref 70–99)
Glucose-Capillary: 298 mg/dL — ABNORMAL HIGH (ref 70–99)

## 2013-10-30 LAB — GLUCOSE, CAPILLARY
Glucose-Capillary: 119 mg/dL — ABNORMAL HIGH (ref 70–99)
Glucose-Capillary: 351 mg/dL — ABNORMAL HIGH (ref 70–99)
Glucose-Capillary: 293 mg/dL — ABNORMAL HIGH (ref 70–99)

## 2013-10-31 LAB — GLUCOSE, CAPILLARY
GLUCOSE-CAPILLARY: 271 mg/dL — AB (ref 70–99)
Glucose-Capillary: 160 mg/dL — ABNORMAL HIGH (ref 70–99)

## 2013-11-01 LAB — GLUCOSE, CAPILLARY
GLUCOSE-CAPILLARY: 215 mg/dL — AB (ref 70–99)
Glucose-Capillary: 93 mg/dL (ref 70–99)

## 2013-11-02 LAB — GLUCOSE, CAPILLARY
GLUCOSE-CAPILLARY: 139 mg/dL — AB (ref 70–99)
Glucose-Capillary: 296 mg/dL — ABNORMAL HIGH (ref 70–99)

## 2013-11-03 LAB — GLUCOSE, CAPILLARY
GLUCOSE-CAPILLARY: 83 mg/dL (ref 70–99)
Glucose-Capillary: 259 mg/dL — ABNORMAL HIGH (ref 70–99)

## 2013-11-04 LAB — GLUCOSE, CAPILLARY: Glucose-Capillary: 215 mg/dL — ABNORMAL HIGH (ref 70–99)

## 2013-11-05 LAB — GLUCOSE, CAPILLARY
GLUCOSE-CAPILLARY: 165 mg/dL — AB (ref 70–99)
GLUCOSE-CAPILLARY: 176 mg/dL — AB (ref 70–99)
GLUCOSE-CAPILLARY: 269 mg/dL — AB (ref 70–99)

## 2013-11-06 LAB — GLUCOSE, CAPILLARY: Glucose-Capillary: 173 mg/dL — ABNORMAL HIGH (ref 70–99)

## 2013-11-07 LAB — GLUCOSE, CAPILLARY
GLUCOSE-CAPILLARY: 266 mg/dL — AB (ref 70–99)
Glucose-Capillary: 251 mg/dL — ABNORMAL HIGH (ref 70–99)
Glucose-Capillary: 142 mg/dL — ABNORMAL HIGH (ref 70–99)

## 2013-11-08 LAB — GLUCOSE, CAPILLARY
GLUCOSE-CAPILLARY: 63 mg/dL — AB (ref 70–99)
Glucose-Capillary: 51 mg/dL — ABNORMAL LOW (ref 70–99)
Glucose-Capillary: 250 mg/dL — ABNORMAL HIGH (ref 70–99)

## 2013-11-09 LAB — GLUCOSE, CAPILLARY
GLUCOSE-CAPILLARY: 248 mg/dL — AB (ref 70–99)
Glucose-Capillary: 407 mg/dL — ABNORMAL HIGH (ref 70–99)
Glucose-Capillary: 412 mg/dL — ABNORMAL HIGH (ref 70–99)

## 2013-11-10 LAB — GLUCOSE, CAPILLARY
Glucose-Capillary: 206 mg/dL — ABNORMAL HIGH (ref 70–99)
Glucose-Capillary: 213 mg/dL — ABNORMAL HIGH (ref 70–99)

## 2013-11-11 LAB — GLUCOSE, CAPILLARY
GLUCOSE-CAPILLARY: 75 mg/dL (ref 70–99)
Glucose-Capillary: 300 mg/dL — ABNORMAL HIGH (ref 70–99)

## 2013-11-12 LAB — GLUCOSE, CAPILLARY
GLUCOSE-CAPILLARY: 121 mg/dL — AB (ref 70–99)
Glucose-Capillary: 310 mg/dL — ABNORMAL HIGH (ref 70–99)
Glucose-Capillary: 315 mg/dL — ABNORMAL HIGH (ref 70–99)

## 2013-11-13 LAB — GLUCOSE, CAPILLARY: Glucose-Capillary: 190 mg/dL — ABNORMAL HIGH (ref 70–99)

## 2013-11-14 LAB — GLUCOSE, CAPILLARY
GLUCOSE-CAPILLARY: 244 mg/dL — AB (ref 70–99)
GLUCOSE-CAPILLARY: 75 mg/dL (ref 70–99)
Glucose-Capillary: 45 mg/dL — ABNORMAL LOW (ref 70–99)
Glucose-Capillary: 308 mg/dL — ABNORMAL HIGH (ref 70–99)

## 2013-11-15 LAB — GLUCOSE, CAPILLARY
Glucose-Capillary: 55 mg/dL — ABNORMAL LOW (ref 70–99)
Glucose-Capillary: 72 mg/dL (ref 70–99)
Glucose-Capillary: 251 mg/dL — ABNORMAL HIGH (ref 70–99)

## 2013-11-16 LAB — GLUCOSE, CAPILLARY
GLUCOSE-CAPILLARY: 112 mg/dL — AB (ref 70–99)
GLUCOSE-CAPILLARY: 322 mg/dL — AB (ref 70–99)

## 2013-11-17 LAB — GLUCOSE, CAPILLARY
GLUCOSE-CAPILLARY: 82 mg/dL (ref 70–99)
GLUCOSE-CAPILLARY: 207 mg/dL — AB (ref 70–99)

## 2013-11-18 LAB — GLUCOSE, CAPILLARY
Glucose-Capillary: 107 mg/dL — ABNORMAL HIGH (ref 70–99)
Glucose-Capillary: 252 mg/dL — ABNORMAL HIGH (ref 70–99)

## 2013-11-19 LAB — GLUCOSE, CAPILLARY
GLUCOSE-CAPILLARY: 99 mg/dL (ref 70–99)
Glucose-Capillary: 53 mg/dL — ABNORMAL LOW (ref 70–99)
Glucose-Capillary: 63 mg/dL — ABNORMAL LOW (ref 70–99)
Glucose-Capillary: 333 mg/dL — ABNORMAL HIGH (ref 70–99)

## 2013-11-20 LAB — GLUCOSE, CAPILLARY
GLUCOSE-CAPILLARY: 231 mg/dL — AB (ref 70–99)
Glucose-Capillary: 64 mg/dL — ABNORMAL LOW (ref 70–99)
Glucose-Capillary: 72 mg/dL (ref 70–99)

## 2013-11-21 LAB — GLUCOSE, CAPILLARY
GLUCOSE-CAPILLARY: 291 mg/dL — AB (ref 70–99)
Glucose-Capillary: 103 mg/dL — ABNORMAL HIGH (ref 70–99)

## 2013-11-22 ENCOUNTER — Non-Acute Institutional Stay (SKILLED_NURSING_FACILITY): Payer: Medicare HMO | Admitting: Internal Medicine

## 2013-11-22 DIAGNOSIS — N189 Chronic kidney disease, unspecified: Secondary | ICD-10-CM

## 2013-11-22 DIAGNOSIS — E1165 Type 2 diabetes mellitus with hyperglycemia: Secondary | ICD-10-CM

## 2013-11-22 DIAGNOSIS — E1129 Type 2 diabetes mellitus with other diabetic kidney complication: Secondary | ICD-10-CM

## 2013-11-22 DIAGNOSIS — M11249 Other chondrocalcinosis, unspecified hand: Secondary | ICD-10-CM

## 2013-11-22 DIAGNOSIS — I69959 Hemiplegia and hemiparesis following unspecified cerebrovascular disease affecting unspecified side: Secondary | ICD-10-CM

## 2013-11-22 LAB — GLUCOSE, CAPILLARY
GLUCOSE-CAPILLARY: 68 mg/dL — AB (ref 70–99)
Glucose-Capillary: 306 mg/dL — ABNORMAL HIGH (ref 70–99)

## 2013-11-23 LAB — GLUCOSE, CAPILLARY
GLUCOSE-CAPILLARY: 368 mg/dL — AB (ref 70–99)
Glucose-Capillary: 310 mg/dL — ABNORMAL HIGH (ref 70–99)

## 2013-11-23 MED ORDER — SERTRALINE HCL 50 MG PO TABS: 50.0000 mg | ORAL_TABLET | Freq: Every day | ORAL | Status: AC

## 2013-11-23 MED ORDER — INSULIN GLARGINE 100 UNIT/ML ~~LOC~~ SOLN: 15.0000 [IU] | Freq: Every day | SUBCUTANEOUS | Status: DC

## 2013-11-23 NOTE — Progress Notes (Signed)
Patient ID: Brady Frazier, male   DOB: 1941-01-17, 73 y.o.   MRN: [id number] Facility: [facility] Chief complaint: Transfer Note History: From what I can determine it would appear that this man has been in the building since May of 2014. 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 He apparently received TPA At an outside hospital and suffered an intraparenchymal hemorrhage. He came to this facility after a stay at [facility] inpatient rehabilitation. He has been cared for since that time in this building by Dr. 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 Diabetes mellitus   . Hypertension   . Stroke   . Kidney disease    Past Surgical History  Procedure Laterality Date  . Stomach surgery    . Tee without cardioversion N/A 02/25/2013    Procedure: TRANSESOPHAGEAL ECHOCARDIOGRAM (TEE);  Surgeon: Lewayne Bunting, MD;  Location: [facility] ENDOSCOPY;  Service: Cardiovascular;  Laterality: N/A;  Rosann Auerbach Fawn Kirk   Current Medidations:Zoloft 50 mg daily, hydrochlorothiazide 25 daily, Lipitor 20 daily, Glucerna nutritional supplement 237 cc twice a day, Lantus 15 units subcutaneous each bedtime  Lab work; from 07/12/2013 shows an LDL cholesterol of 65, hemoglobin A1c from August 2014 of 5.9, also from August 2014 a BUN of 30 and a creatinine of 2.2, hemoglobin of 9.6 and an albumin of 2.8  Social history; there is virtually no information on this man. There is no advanced directive  reports that he has never smoked. He does not have any smokeless tobacco history on file. He reports that he does not drink alcohol or use illicit drugs.  Review of systems; not possible from the patient does staff report he has had intense pain and  swelling in his right hand and wrist over the last 2 weeks. 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 There is no signs of heart failure and I detected no murmur there were no carotid bruits Musculoskeletal; there is intense pain and swelling involving the PIPs, MCPs the dorsal aspect of his right hand and right wrist. This is exceedingly tender. I detected no other active joints Neurological; this is a very disabled man. He does not appear to be able to use the right arm, minimal use of the left arm. Both his legs appear to be contractured with markedly increased tone. He is not appear to be able to communicate although at one point he appeared to mumble his first name  Impression/plan #1 late effect the dominant hemisphere stroke complicated by a post TPA intracerebral hemorrhage. This is less this man very disabled. I do not have much information on this man from prior to May of 2014. 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 He has had an x-ray that is negative for however I think this is most likely to be pseudogout or possibly gout. He did receive a short tapering course of prednisone in late January and I will try to repeat this. He is not a candidate for NSAIDs given his renal insufficiency. 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 We will recheck this #4  chronic renal failure;    I am going to rechallenge this man with prednisone they're not a large number of options here. I will update his lab work including his BUN creatinine hemoglobin A1c.        *[place]*                *[facility]*                      [address]                     [place], [place] [address]                         [phone]    ------------------------------------------------------------ Transesophageal Echocardiography  Patient:    Brady, Frazier MR #:       [id number] Study Date: 02/25/2013 Gender:     M Age:        23 Height:     172.7cm Weight:     83.6kg BSA:        2.30m^2 Pt. Status: Room:       4N05C    PERFORMING   Crenshaw, [name]  ATTENDING    Lynelle Doctor, Iva L  ADMITTING    Delia Heady  SONOGRAPHER  Perley Jain, RDCS  ORDERING     Barrett, Mindi Junker    Barrett, Rhonda cc:  ------------------------------------------------------------ LV EF: 55% -   60%  ------------------------------------------------------------ Indications:      CVA 436.  ------------------------------------------------------------ Study Conclusions  - Left ventricle: Hypertrophy was noted. Systolic function   was normal. The estimated ejection fraction was in the   range of 55% to 60%. Wall motion was normal; there were no   regional wall motion abnormalities. - Mitral valve: No evidence of vegetation. - Left atrium: No evidence of thrombus in the atrial cavity   or appendage. - Atrial septum: There was a patent foramen ovale. There was   an atrial septal aneurysm. - Tricuspid valve: No evidence of vegetation. Transesophageal echocardiography.  2D and color Doppler. Height:  Height: 172.7cm. Height: 68in.  Weight:  Weight: 83.6kg. Weight: 184lb.  Body mass index:  BMI: 28kg/m^2. Body surface area:    BSA: 2.90m^2.  Blood pressure: 165/88.  Patient status:  Inpatient.  Location:  Endoscopy.   ------------------------------------------------------------  ------------------------------------------------------------ Left ventricle:  Hypertrophy was noted. Systolic function was normal. The estimated ejection fraction was in the range of 55% to 60%. Wall motion was normal; there were no regional wall motion abnormalities.  ------------------------------------------------------------ Aortic valve:    Trileaflet.  Doppler:   No regurgitation.   ------------------------------------------------------------ Aorta:  Aortic root: The aortic root was mildly dilated. Descending aorta: Mild to moderate atherosclerosis.  ------------------------------------------------------------ Mitral valve:   Structurally normal valve.   Leaflet separation was normal.  No evidence of vegetation.  Doppler:   Trivial regurgitation.  ------------------------------------------------------------ Left atrium:  The atrium was normal in size.  No evidence of thrombus in the atrial cavity or appendage.  ------------------------------------------------------------ Atrial septum:  There was a patent foramen ovale by color Doppler. There was an atrial septal aneurysm.  ------------------------------------------------------------ Right ventricle:  The cavity size was normal. Systolic function was normal.  ------------------------------------------------------------ Pulmonic valve:    The valve appears to be grossly normal.   ------------------------------------------------------------ Tricuspid valve:   Structurally normal valve.   Leaflet separation was normal.  No evidence of vegetation.  Doppler:  Trivial regurgitation.  ------------------------------------------------------------ Right atrium:  The atrium was normal in size. 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  ------------------------------------------------------------ Pericardium:  There was no pericardial effusion.   ------------------------------------------------------------ Prepared and Electronically Authenticated by  Olga Millersrenshaw, [name] 2014-05-15T16:20:[id number]     Wall Scoring       Show images for MR Brain Wo Contrast     Study Result    *RADIOLOGY REPORT*   Clinical Data:  Post t-PA hemorrhage.  Left brain stroke.   MRI HEAD WITHOUT CONTRAST MRA HEAD WITHOUT CONTRAST   Technique:  Multiplanar, multiecho pulse sequences  of the brain and surrounding structures were obtained without intravenous contrast. Angiographic images of the head were obtained using MRA technique without contrast.   Comparison:  CT head 02/20/2013 through 02/22/13.  MRI brain 07/04/2010.   MRI HEAD   Findings:  Scattered areas of left MCA territory posterior frontal, parietal, cortical and subcortical acute infarction are separate from the area of hemorrhage. Their multiplicity suggests multiple emboli.  There is a large (68 x 52 x 42 mm, approx 70 ml) acute left parieto-occipital intracerebral hematoma with slight intraventricular extension. There is peripheral restricted diffusion.  There is mild vasogenic edema surrounding hematoma which extends to the surface of the brain.  There is compression of the left lateral ventricle.  No midline shift is evident. There is no visible intracranial mass lesion.   Compared with the prior MR from 2011, no underlying vascular malformation in this region was identified.   Remote infarctions are seen in the left superior cerebellum, right mid cerebellum, and left occipital lobe.  Foci of chronic hemorrhage can be seen associated with right greater than left posterior temporal lobe infarcts as seen on gradient sequence image 14 series 8.  Moderate ventriculomegaly similar to priors, without evidence for obstructive hydrocephalus.   Atrophy and chronic microvascular ischemic change.  No osseous lesions.  Cervical spondylosis incompletely evaluated but appears fairly pronounced at C3-C4.  No tonsillar herniation. Mild chronic sinus disease.   IMPRESSION: 68 x 52 x 42 mm, approximate 70 ml intracerebral hemorrhage, with intraventricular extension. Multiple areas of left hemisphere restricted diffusion consistent with acute non hemorrhagic embolic infarction.  Findings consistent with post t-PA hemorrhage without visible underlying vascular malformation.   Areas of atrophy, small vessel  disease, and chronic infarction elsewhere as described.   MRA HEAD   Findings: Internal carotid arteries widely patent.  The basilar artery is widely patent with vertebrals codominant.  There is no proximal intracranial stenosis or aneurysm.  No vascular malformation is evident.  Mild irregularity distal MCA and PCA branches suggests intracranial atherosclerotic change.   IMPRESSION: No proximal stenosis or vascular malformation.  No intracranial aneurysm is evident.     Original Report Authenticated By: Davonna BellingJohn Curnes, M.D.

## 2013-11-24 LAB — GLUCOSE, CAPILLARY
Glucose-Capillary: 236 mg/dL — ABNORMAL HIGH (ref 70–99)
Glucose-Capillary: 255 mg/dL — ABNORMAL HIGH (ref 70–99)

## 2013-11-25 LAB — GLUCOSE, CAPILLARY
Glucose-Capillary: 123 mg/dL — ABNORMAL HIGH (ref 70–99)
Glucose-Capillary: 276 mg/dL — ABNORMAL HIGH (ref 70–99)

## 2013-11-26 LAB — GLUCOSE, CAPILLARY
GLUCOSE-CAPILLARY: 190 mg/dL — AB (ref 70–99)
Glucose-Capillary: 325 mg/dL — ABNORMAL HIGH (ref 70–99)

## 2013-11-27 LAB — GLUCOSE, CAPILLARY
GLUCOSE-CAPILLARY: 187 mg/dL — AB (ref 70–99)
Glucose-Capillary: 364 mg/dL — ABNORMAL HIGH (ref 70–99)
Glucose-Capillary: 350 mg/dL — ABNORMAL HIGH (ref 70–99)

## 2013-11-28 LAB — GLUCOSE, CAPILLARY
Glucose-Capillary: 45 mg/dL — ABNORMAL LOW (ref 70–99)
Glucose-Capillary: 52 mg/dL — ABNORMAL LOW (ref 70–99)
Glucose-Capillary: 115 mg/dL — ABNORMAL HIGH (ref 70–99)

## 2013-11-29 LAB — GLUCOSE, CAPILLARY: GLUCOSE-CAPILLARY: 95 mg/dL (ref 70–99)

## 2013-11-30 LAB — GLUCOSE, CAPILLARY
GLUCOSE-CAPILLARY: 355 mg/dL — AB (ref 70–99)
GLUCOSE-CAPILLARY: 313 mg/dL — AB (ref 70–99)
Glucose-Capillary: 422 mg/dL — ABNORMAL HIGH (ref 70–99)
Glucose-Capillary: 358 mg/dL — ABNORMAL HIGH (ref 70–99)
Glucose-Capillary: 312 mg/dL — ABNORMAL HIGH (ref 70–99)
Glucose-Capillary: 166 mg/dL — ABNORMAL HIGH (ref 70–99)

## 2013-12-01 LAB — GLUCOSE, CAPILLARY
Glucose-Capillary: 57 mg/dL — ABNORMAL LOW (ref 70–99)
Glucose-Capillary: 87 mg/dL (ref 70–99)
Glucose-Capillary: 356 mg/dL — ABNORMAL HIGH (ref 70–99)

## 2013-12-02 LAB — GLUCOSE, CAPILLARY
GLUCOSE-CAPILLARY: 174 mg/dL — AB (ref 70–99)
GLUCOSE-CAPILLARY: 286 mg/dL — AB (ref 70–99)
Glucose-Capillary: 420 mg/dL — ABNORMAL HIGH (ref 70–99)
Glucose-Capillary: 426 mg/dL — ABNORMAL HIGH (ref 70–99)
Glucose-Capillary: 444 mg/dL — ABNORMAL HIGH (ref 70–99)

## 2013-12-03 LAB — GLUCOSE, CAPILLARY
GLUCOSE-CAPILLARY: 404 mg/dL — AB (ref 70–99)
Glucose-Capillary: 271 mg/dL — ABNORMAL HIGH (ref 70–99)

## 2013-12-04 LAB — GLUCOSE, CAPILLARY
GLUCOSE-CAPILLARY: 159 mg/dL — AB (ref 70–99)
Glucose-Capillary: 224 mg/dL — ABNORMAL HIGH (ref 70–99)
Glucose-Capillary: 368 mg/dL — ABNORMAL HIGH (ref 70–99)
Glucose-Capillary: 401 mg/dL — ABNORMAL HIGH (ref 70–99)

## 2013-12-05 LAB — GLUCOSE, CAPILLARY
GLUCOSE-CAPILLARY: 169 mg/dL — AB (ref 70–99)
Glucose-Capillary: 152 mg/dL — ABNORMAL HIGH (ref 70–99)

## 2013-12-06 LAB — GLUCOSE, CAPILLARY
Glucose-Capillary: 292 mg/dL — ABNORMAL HIGH (ref 70–99)
Glucose-Capillary: 318 mg/dL — ABNORMAL HIGH (ref 70–99)
Glucose-Capillary: 194 mg/dL — ABNORMAL HIGH (ref 70–99)
Glucose-Capillary: 198 mg/dL — ABNORMAL HIGH (ref 70–99)
Glucose-Capillary: 201 mg/dL — ABNORMAL HIGH (ref 70–99)
Glucose-Capillary: 214 mg/dL — ABNORMAL HIGH (ref 70–99)
Glucose-Capillary: 206 mg/dL — ABNORMAL HIGH (ref 70–99)

## 2013-12-07 LAB — GLUCOSE, CAPILLARY
GLUCOSE-CAPILLARY: 181 mg/dL — AB (ref 70–99)
Glucose-Capillary: 208 mg/dL — ABNORMAL HIGH (ref 70–99)
Glucose-Capillary: 218 mg/dL — ABNORMAL HIGH (ref 70–99)
Glucose-Capillary: 196 mg/dL — ABNORMAL HIGH (ref 70–99)

## 2013-12-08 ENCOUNTER — Non-Acute Institutional Stay (SKILLED_NURSING_FACILITY): Payer: Commercial Managed Care - HMO | Admitting: Internal Medicine

## 2013-12-08 DIAGNOSIS — M79609 Pain in unspecified limb: Secondary | ICD-10-CM

## 2013-12-08 DIAGNOSIS — M79641 Pain in right hand: Secondary | ICD-10-CM

## 2013-12-08 DIAGNOSIS — E119 Type 2 diabetes mellitus without complications: Secondary | ICD-10-CM

## 2013-12-08 DIAGNOSIS — N289 Disorder of kidney and ureter, unspecified: Secondary | ICD-10-CM

## 2013-12-08 LAB — GLUCOSE, CAPILLARY
GLUCOSE-CAPILLARY: 119 mg/dL — AB (ref 70–99)
GLUCOSE-CAPILLARY: 218 mg/dL — AB (ref 70–99)
Glucose-Capillary: 81 mg/dL (ref 70–99)
Glucose-Capillary: 229 mg/dL — ABNORMAL HIGH (ref 70–99)

## 2013-12-08 IMAGING — CR DG KNEE COMPLETE 4+V*L*
4 series · 4 of 4 positions shown · non-contrast
Comparison: None

CLINICAL DATA: Acute left knee pain, unable to completely straight
knee due to pain

EXAM:
LEFT KNEE - COMPLETE 4+ VIEW

[view not recorded (1 of 4)]
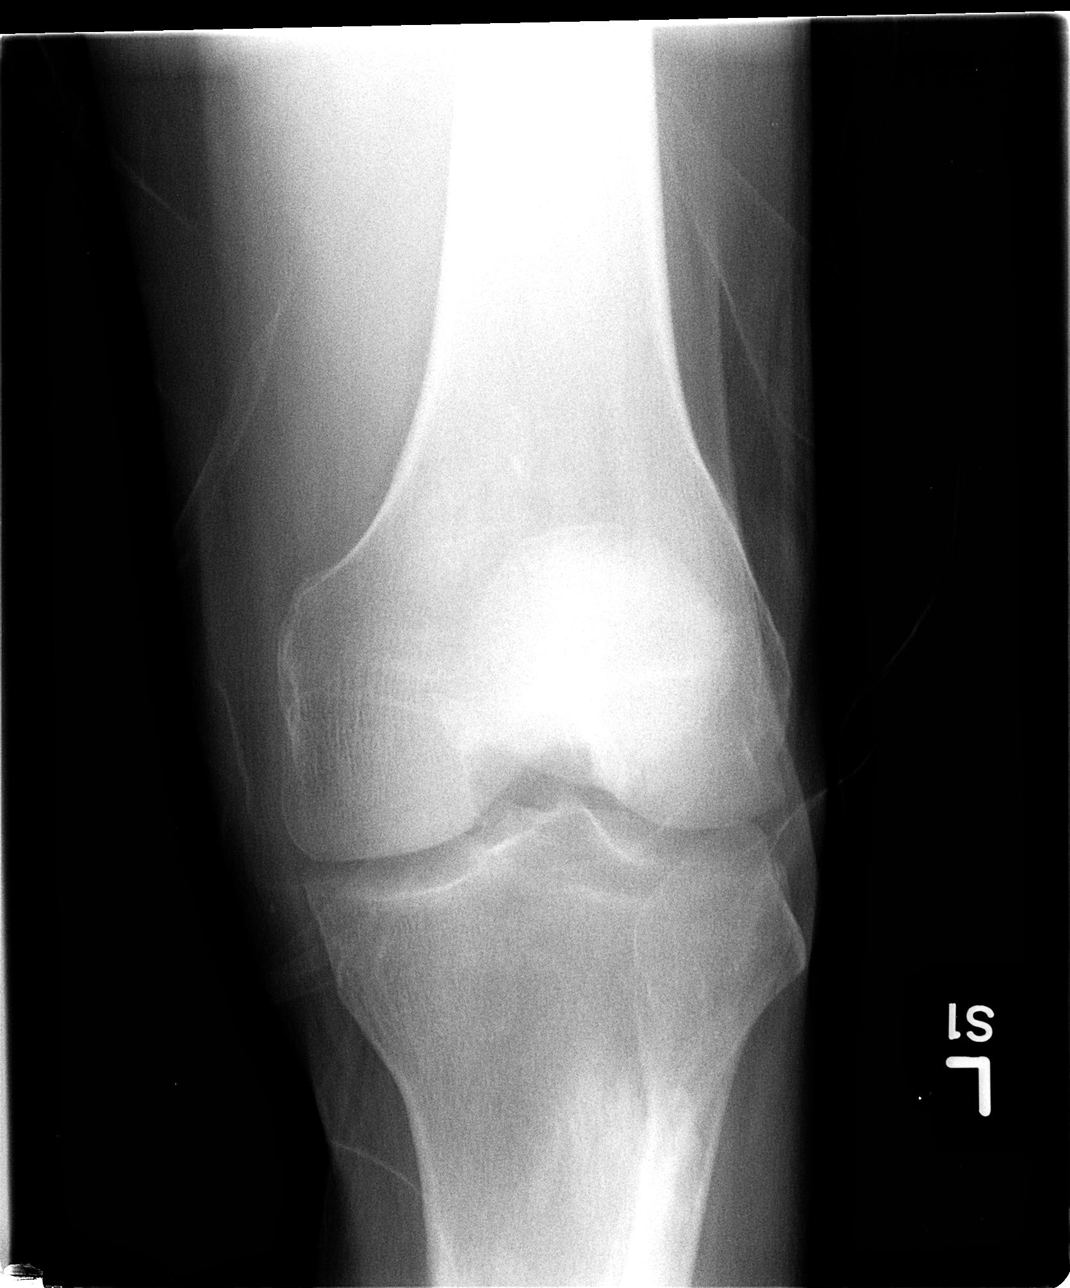

[view not recorded (2 of 4)]
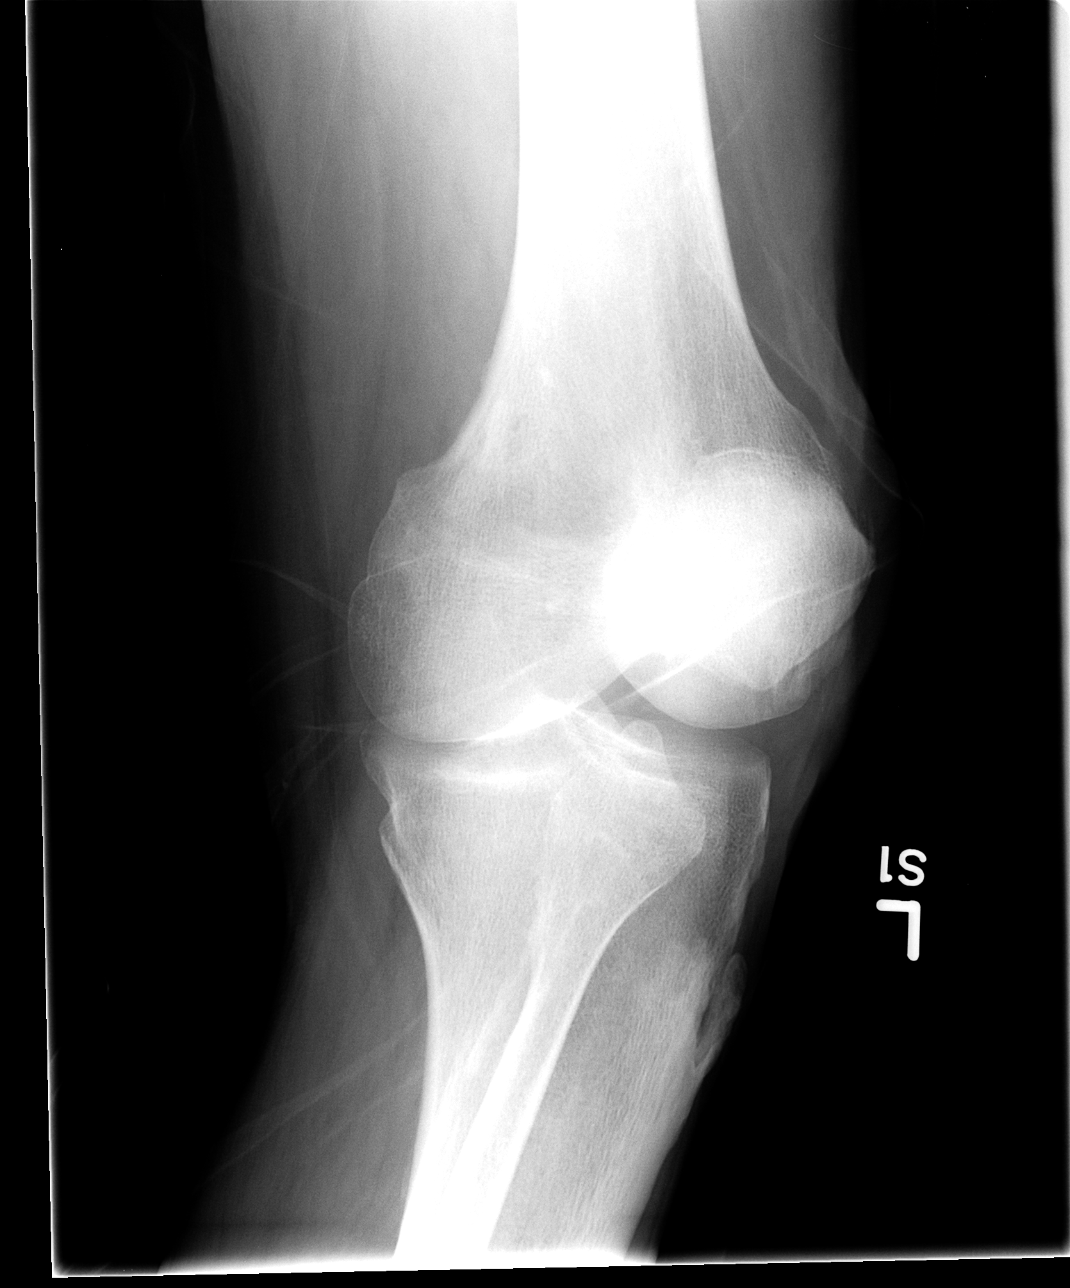

[view not recorded (3 of 4)]
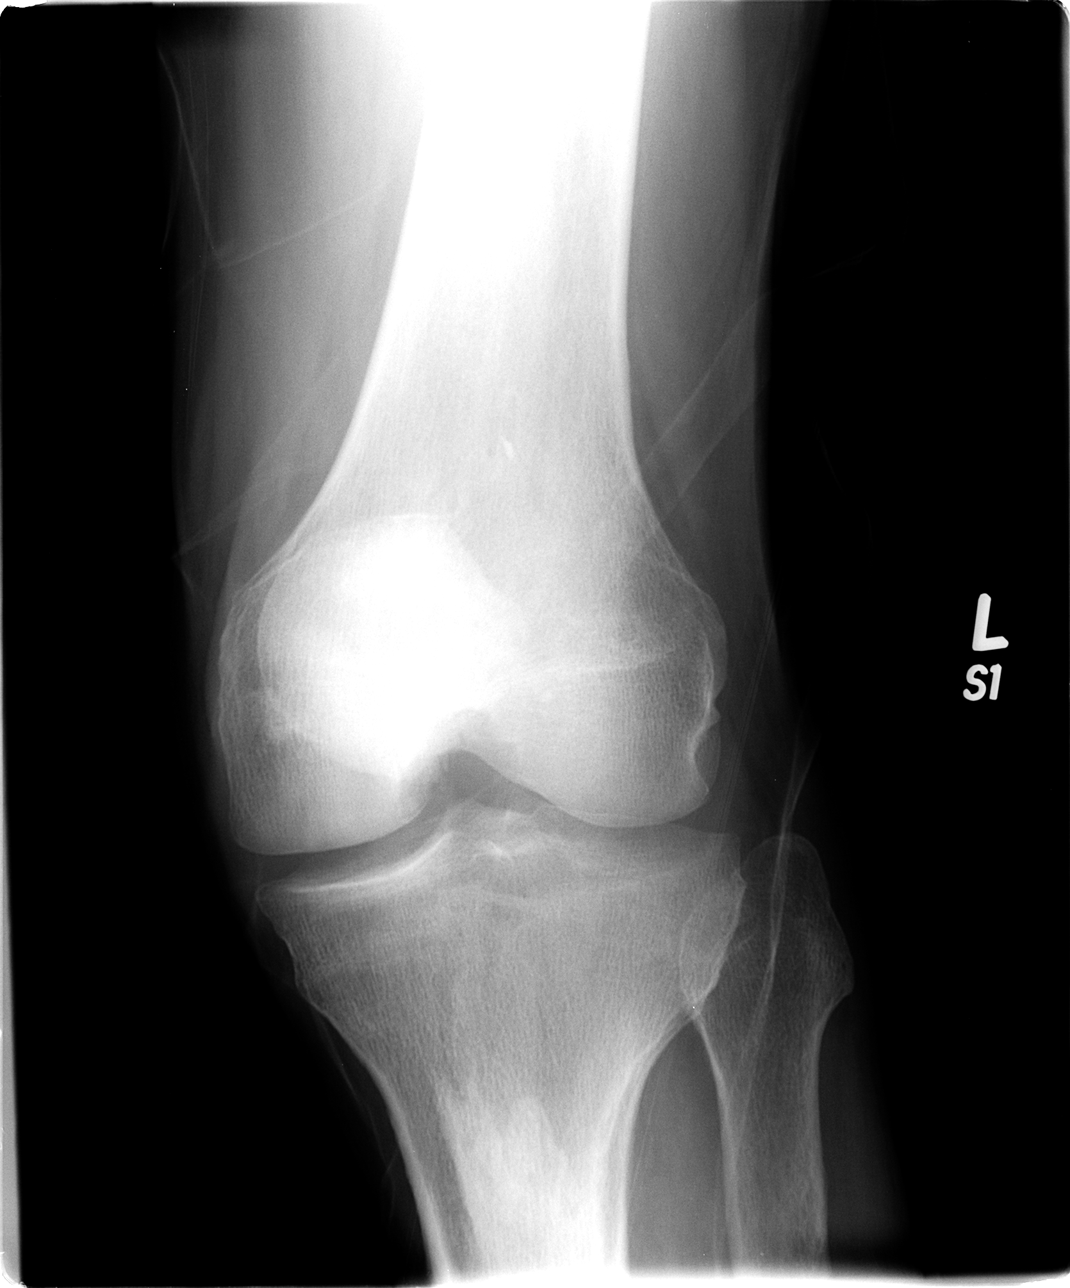

[view not recorded (4 of 4)]
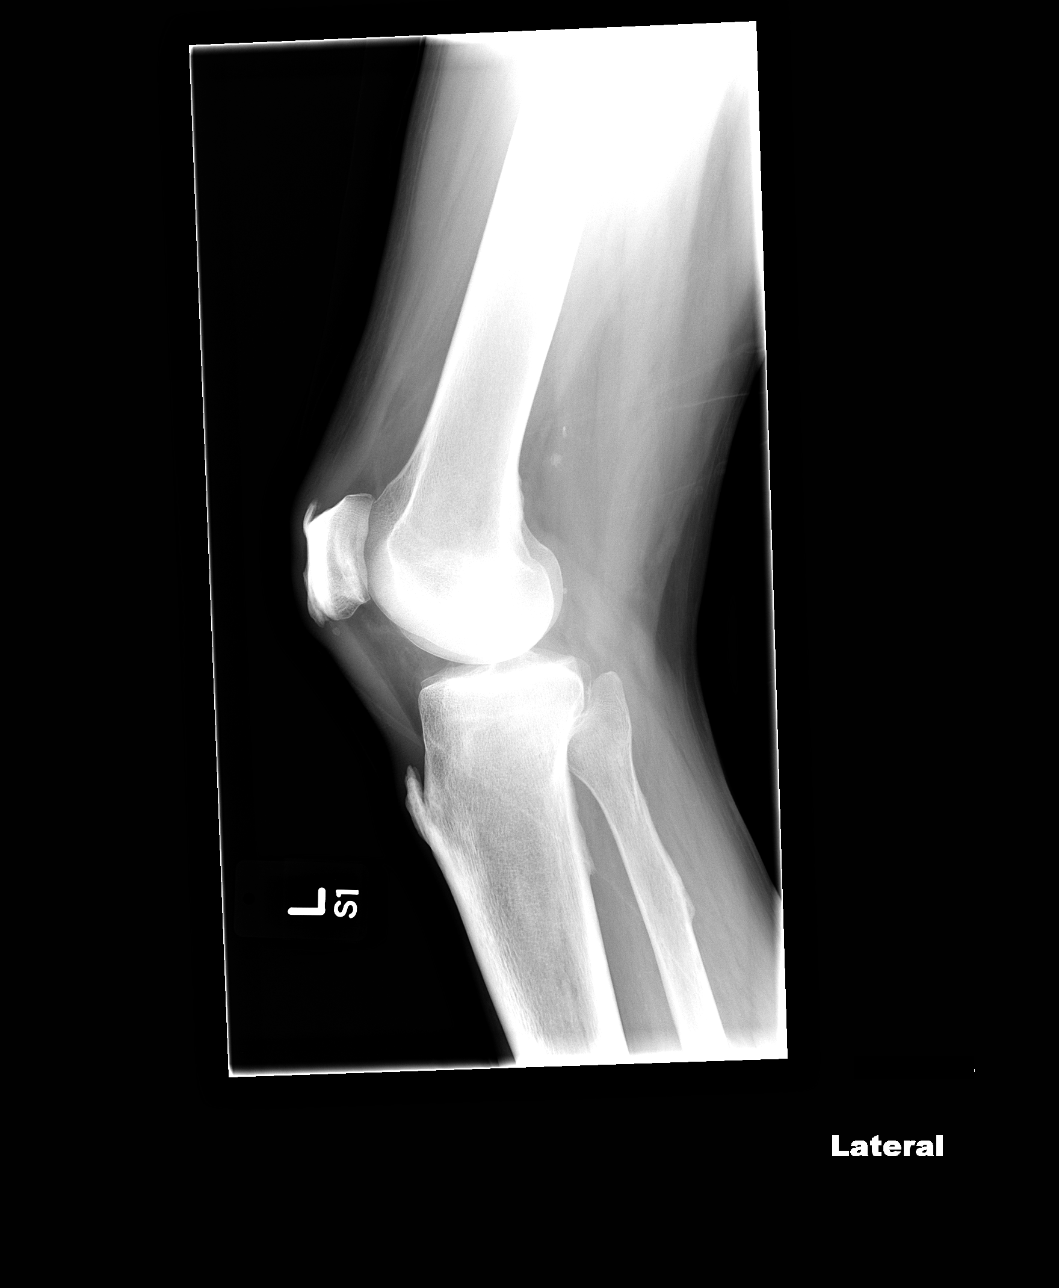

[4 of 4 positions shown; findings below may reference images not displayed]

FINDINGS: Osseous mineralization grossly normal.

Joint spaces preserved.

No acute fracture, dislocation or bone destruction.

Prominent tibial tubercle spur.

Patellar spurs at quadriceps and patellar tendon insertions.

No knee joint effusion.
IMPRESSION: No acute osseous abnormalities.

## 2013-12-08 NOTE — Progress Notes (Signed)
Patient ID: Brady Frazier, male   DOB: 1941-07-23, 73 y.o.   MRN: [id number]   This is an acute visit.  Level of care skilled.  [facility]  Chief complaint acute visit followup right wrist edema pain.  History of present illness.  Patient is an elderly resident who apparently has developed recently some increased right wrist and hand discomfort and swelling.  Earlier this month x-rays were done which showed essentially osteal arthritis with some soft tissue swelling of the wrist nonspecific.  Dr. Leanord Hawkingobson did assess patient and started him on a course of prednisone which has been completed apparently this did help some.  The uric acid also was done which was high normal.  Nursing staff tonight tells me that he's continuing to have some pain and I am reassessing this also there is some thought he may have some bruising around the area.  Patient is a poor historian secondary to severe dementia but there apparently has been no history of trauma noted.  Family medical social history as been reviewed per admission note to our service on 11/22/2013.  Medications have been reviewed prior malar.  Review of systems.  Essentially unobtainable please see history of present illness.  Physical exam.  Temperature is 98.7 pulse 90 respirations 18 blood pressure 121/71.  General this is a frail elderly male in no distress lying comfortably in bed.  His skin is warm and dry.  Chest is clear to auscultation with poor effort there is no labored breathing.  Heart is regular rate and rhythm without murmur gallop or rub.  GI-abdomen is soft nontender with positive bowel sounds  Muscle skeletal-does move all extremities x4 normally ambulates in a wheelchair right wrist there is some swelling more  of the hand area versus the wrist this involves the fingers as well this is cool to touch capillary refill is intact as well as the radial pulse  There is some tenderness to palpation of the hand  and wrist and patient grimaces when the wrist is flexed and extended  There appears to be a small bruise on the medial side of the right index finger and possibly a small one on the top of the wrist as well although this is not appear to be real remarkable    Labs.  11/24/2013.  Hemoglobin A1c 6.8.  Sodium 139 potassium 4.2 BUN 45 creatinine 2.10.  Liver function tests within normal limits except albumin of 2.6.  Uric acid level 7.6  Assessment and plan.  #1-wrist and hand pain with edema-Will re  x-ray this to see if there's been any change owith possibly some new bruising although this appears to be quite minimal-also will update a uric acid level--her  Of note patient also had an Ace wrap around the affected area will DC this for now.  Do note patient at one point was on prednisone appear his blood sugars did increase however now off the prednisone I see morning CBG today was 81 at noon 119 was a bit elevated at 229 at 4 PM and 218 this evening at this point will continue to monitor at one point earlier in his stay he did have occasional readings in the 3 400s I do not see this the past 2-3 days.  At this point we'll continue to monitor-certainly if pain persists and he gets relief from prednisone may have to consider restarting this.  #2 r renal insufficiency we'll update a metabolic panel recent creatinine was at his baseline at 2.1.  Also  will update a CBC so we have an updated value  3-diabetes-patient is on Lantus 25 units daily-as noted above will monitor this since he is now off prednisone blood sugars appear to be a bit improved however may have to increase Lantus especially if he goes back on prednisone  CPT-99308      .   Marland Kitchen

## 2013-12-09 ENCOUNTER — Encounter: Payer: Self-pay | Admitting: Internal Medicine

## 2013-12-09 DIAGNOSIS — M79641 Pain in right hand: Secondary | ICD-10-CM | POA: Insufficient documentation

## 2013-12-09 LAB — GLUCOSE, CAPILLARY
GLUCOSE-CAPILLARY: 44 mg/dL — AB (ref 70–99)
GLUCOSE-CAPILLARY: 107 mg/dL — AB (ref 70–99)
Glucose-Capillary: 149 mg/dL — ABNORMAL HIGH (ref 70–99)
Glucose-Capillary: 330 mg/dL — ABNORMAL HIGH (ref 70–99)
Glucose-Capillary: 286 mg/dL — ABNORMAL HIGH (ref 70–99)

## 2013-12-10 ENCOUNTER — Non-Acute Institutional Stay (SKILLED_NURSING_FACILITY): Payer: Medicare HMO | Admitting: Internal Medicine

## 2013-12-10 DIAGNOSIS — M11249 Other chondrocalcinosis, unspecified hand: Secondary | ICD-10-CM

## 2013-12-10 DIAGNOSIS — M79641 Pain in right hand: Secondary | ICD-10-CM

## 2013-12-10 DIAGNOSIS — M79609 Pain in unspecified limb: Secondary | ICD-10-CM

## 2013-12-10 LAB — GLUCOSE, CAPILLARY
GLUCOSE-CAPILLARY: 255 mg/dL — AB (ref 70–99)
GLUCOSE-CAPILLARY: 464 mg/dL — AB (ref 70–99)
Glucose-Capillary: 74 mg/dL (ref 70–99)
Glucose-Capillary: 251 mg/dL — ABNORMAL HIGH (ref 70–99)

## 2013-12-11 LAB — GLUCOSE, CAPILLARY
GLUCOSE-CAPILLARY: 161 mg/dL — AB (ref 70–99)
GLUCOSE-CAPILLARY: 397 mg/dL — AB (ref 70–99)
Glucose-Capillary: 262 mg/dL — ABNORMAL HIGH (ref 70–99)
Glucose-Capillary: 371 mg/dL — ABNORMAL HIGH (ref 70–99)
Glucose-Capillary: 410 mg/dL — ABNORMAL HIGH (ref 70–99)

## 2013-12-12 ENCOUNTER — Encounter (HOSPITAL_COMMUNITY): Payer: Self-pay | Admitting: Emergency Medicine

## 2013-12-12 ENCOUNTER — Ambulatory Visit (HOSPITAL_COMMUNITY)
Admit: 2013-12-12 | Discharge: 2013-12-12 | Disposition: A | Discharge status: Home / Self Care | Payer: Medicare HMO | Attending: Internal Medicine | Admitting: Internal Medicine

## 2013-12-12 ENCOUNTER — Inpatient Hospital Stay (HOSPITAL_COMMUNITY)
Admission: EM | Admit: 2013-12-12 | Discharge: 2013-12-16 | DRG: 193 | Disposition: A | Discharge status: Skilled Nursing Facility | Payer: Medicare HMO | Attending: Family Medicine | Admitting: Family Medicine

## 2013-12-12 DIAGNOSIS — J189 Pneumonia, unspecified organism: Principal | ICD-10-CM

## 2013-12-12 DIAGNOSIS — Q211 Atrial septal defect: Secondary | ICD-10-CM

## 2013-12-12 DIAGNOSIS — Q2111 Secundum atrial septal defect: Secondary | ICD-10-CM

## 2013-12-12 DIAGNOSIS — R739 Hyperglycemia, unspecified: Secondary | ICD-10-CM

## 2013-12-12 DIAGNOSIS — D649 Anemia, unspecified: Secondary | ICD-10-CM

## 2013-12-12 DIAGNOSIS — I69959 Hemiplegia and hemiparesis following unspecified cerebrovascular disease affecting unspecified side: Secondary | ICD-10-CM

## 2013-12-12 DIAGNOSIS — N179 Acute kidney failure, unspecified: Secondary | ICD-10-CM | POA: Diagnosis present

## 2013-12-12 DIAGNOSIS — N189 Chronic kidney disease, unspecified: Secondary | ICD-10-CM

## 2013-12-12 DIAGNOSIS — Q2112 Patent foramen ovale: Secondary | ICD-10-CM

## 2013-12-12 DIAGNOSIS — E871 Hypo-osmolality and hyponatremia: Secondary | ICD-10-CM

## 2013-12-12 DIAGNOSIS — I63512 Cerebral infarction due to unspecified occlusion or stenosis of left middle cerebral artery: Secondary | ICD-10-CM | POA: Diagnosis present

## 2013-12-12 DIAGNOSIS — I129 Hypertensive chronic kidney disease with stage 1 through stage 4 chronic kidney disease, or unspecified chronic kidney disease: Secondary | ICD-10-CM | POA: Diagnosis present

## 2013-12-12 DIAGNOSIS — J9600 Acute respiratory failure, unspecified whether with hypoxia or hypercapnia: Secondary | ICD-10-CM | POA: Diagnosis present

## 2013-12-12 DIAGNOSIS — E119 Type 2 diabetes mellitus without complications: Secondary | ICD-10-CM

## 2013-12-12 DIAGNOSIS — G9340 Encephalopathy, unspecified: Secondary | ICD-10-CM | POA: Diagnosis present

## 2013-12-12 DIAGNOSIS — D638 Anemia in other chronic diseases classified elsewhere: Secondary | ICD-10-CM | POA: Diagnosis present

## 2013-12-12 DIAGNOSIS — I619 Nontraumatic intracerebral hemorrhage, unspecified: Secondary | ICD-10-CM | POA: Diagnosis present

## 2013-12-12 DIAGNOSIS — I10 Essential (primary) hypertension: Secondary | ICD-10-CM | POA: Diagnosis present

## 2013-12-12 DIAGNOSIS — N289 Disorder of kidney and ureter, unspecified: Secondary | ICD-10-CM

## 2013-12-12 DIAGNOSIS — Z794 Long term (current) use of insulin: Secondary | ICD-10-CM

## 2013-12-12 DIAGNOSIS — Z66 Do not resuscitate: Secondary | ICD-10-CM | POA: Diagnosis present

## 2013-12-12 DIAGNOSIS — Z993 Dependence on wheelchair: Secondary | ICD-10-CM

## 2013-12-12 DIAGNOSIS — J9601 Acute respiratory failure with hypoxia: Secondary | ICD-10-CM

## 2013-12-12 HISTORY — DX: Aphasia: R47.01

## 2013-12-12 LAB — GLUCOSE, CAPILLARY
Glucose-Capillary: 321 mg/dL — ABNORMAL HIGH (ref 70–99)
Glucose-Capillary: 315 mg/dL — ABNORMAL HIGH (ref 70–99)
Glucose-Capillary: 327 mg/dL — ABNORMAL HIGH (ref 70–99)

## 2013-12-12 NOTE — ED Notes (Signed)
Patient presents to ER via [facility] from [facility].  Patient was in radiology; radiologist stated patient had pneumonia.  Per EMS, patient was taken back to his room from radiology and EMS called.

## 2013-12-13 ENCOUNTER — Encounter (HOSPITAL_COMMUNITY): Payer: Self-pay | Admitting: Internal Medicine

## 2013-12-13 DIAGNOSIS — E119 Type 2 diabetes mellitus without complications: Secondary | ICD-10-CM | POA: Diagnosis present

## 2013-12-13 DIAGNOSIS — J189 Pneumonia, unspecified organism: Principal | ICD-10-CM

## 2013-12-13 DIAGNOSIS — J9601 Acute respiratory failure with hypoxia: Secondary | ICD-10-CM | POA: Diagnosis present

## 2013-12-13 DIAGNOSIS — D649 Anemia, unspecified: Secondary | ICD-10-CM

## 2013-12-13 DIAGNOSIS — N179 Acute kidney failure, unspecified: Secondary | ICD-10-CM

## 2013-12-13 DIAGNOSIS — J9600 Acute respiratory failure, unspecified whether with hypoxia or hypercapnia: Secondary | ICD-10-CM

## 2013-12-13 DIAGNOSIS — N189 Chronic kidney disease, unspecified: Secondary | ICD-10-CM

## 2013-12-13 LAB — CBC WITH DIFFERENTIAL/PLATELET
Basophils Absolute: 0 10*3/uL (ref 0.0–0.1)
Basophils Relative: 0 % (ref 0–1)
Eosinophils Absolute: 0 10*3/uL (ref 0.0–0.7)
Eosinophils Relative: 0 % (ref 0–5)
HCT: 25.9 % — ABNORMAL LOW (ref 39.0–52.0)
Hemoglobin: 9.3 g/dL — ABNORMAL LOW (ref 13.0–17.0)
LYMPHS ABS: 1.9 10*3/uL (ref 0.7–4.0)
Lymphocytes Relative: 11 % — ABNORMAL LOW (ref 12–46)
MCH: 30.6 pg (ref 26.0–34.0)
MCHC: 35.9 g/dL (ref 30.0–36.0)
MCV: 85.2 fL (ref 78.0–100.0)
Monocytes Absolute: 1.4 10*3/uL — ABNORMAL HIGH (ref 0.1–1.0)
Monocytes Relative: 8 % (ref 3–12)
NEUTROS PCT: 80 % — AB (ref 43–77)
Neutro Abs: 13.6 10*3/uL — ABNORMAL HIGH (ref 1.7–7.7)
PLATELETS: 241 10*3/uL (ref 150–400)
RBC: 3.04 MIL/uL — ABNORMAL LOW (ref 4.22–5.81)
RDW: 13.3 % (ref 11.5–15.5)
WBC: 17 10*3/uL — ABNORMAL HIGH (ref 4.0–10.5)

## 2013-12-13 LAB — GLUCOSE, CAPILLARY
GLUCOSE-CAPILLARY: 321 mg/dL — AB (ref 70–99)
GLUCOSE-CAPILLARY: 340 mg/dL — AB (ref 70–99)
GLUCOSE-CAPILLARY: 248 mg/dL — AB (ref 70–99)
Glucose-Capillary: 251 mg/dL — ABNORMAL HIGH (ref 70–99)
Glucose-Capillary: 315 mg/dL — ABNORMAL HIGH (ref 70–99)
Glucose-Capillary: 212 mg/dL — ABNORMAL HIGH (ref 70–99)

## 2013-12-13 LAB — BASIC METABOLIC PANEL
BUN: 64 mg/dL — ABNORMAL HIGH (ref 6–23)
CALCIUM: 8 mg/dL — AB (ref 8.4–10.5)
CO2: 22 meq/L (ref 19–32)
Chloride: 97 mEq/L (ref 96–112)
Creatinine, Ser: 2.7 mg/dL — ABNORMAL HIGH (ref 0.50–1.35)
GFR calc Af Amer: 25 mL/min — ABNORMAL LOW (ref >90)
GFR calc non Af Amer: 22 mL/min — ABNORMAL LOW (ref >90)
Glucose, Bld: 346 mg/dL — ABNORMAL HIGH (ref 70–99)
POTASSIUM: 3.8 meq/L (ref 3.7–5.3)
SODIUM: 132 meq/L — AB (ref 137–147)

## 2013-12-13 LAB — COMPREHENSIVE METABOLIC PANEL
ALT: 26 U/L (ref 0–53)
AST: 20 U/L (ref 0–37)
Albumin: 2.1 g/dL — ABNORMAL LOW (ref 3.5–5.2)
Alkaline Phosphatase: 52 U/L (ref 39–117)
BUN: 62 mg/dL — ABNORMAL HIGH (ref 6–23)
CO2: 24 mEq/L (ref 19–32)
Calcium: 8 mg/dL — ABNORMAL LOW (ref 8.4–10.5)
Chloride: 97 mEq/L (ref 96–112)
Creatinine, Ser: 2.68 mg/dL — ABNORMAL HIGH (ref 0.50–1.35)
GFR calc non Af Amer: 22 mL/min — ABNORMAL LOW (ref >90)
GFR, EST AFRICAN AMERICAN: 25 mL/min — AB (ref >90)
GLUCOSE: 315 mg/dL — AB (ref 70–99)
Potassium: 3.8 mEq/L (ref 3.7–5.3)
SODIUM: 133 meq/L — AB (ref 137–147)
TOTAL PROTEIN: 6.4 g/dL (ref 6.0–8.3)
Total Bilirubin: 0.5 mg/dL (ref 0.3–1.2)

## 2013-12-13 LAB — HEMOGLOBIN A1C
Hgb A1c MFr Bld: 7.5 % — ABNORMAL HIGH (ref <5.7)
Mean Plasma Glucose: 169 mg/dL — ABNORMAL HIGH (ref <117)

## 2013-12-13 LAB — MRSA PCR SCREENING: MRSA by PCR: POSITIVE — AB

## 2013-12-13 LAB — CBC
HEMATOCRIT: 24.2 % — AB (ref 39.0–52.0)
HEMOGLOBIN: 8.6 g/dL — AB (ref 13.0–17.0)
MCH: 30.6 pg (ref 26.0–34.0)
MCHC: 35.5 g/dL (ref 30.0–36.0)
MCV: 86.1 fL (ref 78.0–100.0)
Platelets: 200 10*3/uL (ref 150–400)
RBC: 2.81 MIL/uL — ABNORMAL LOW (ref 4.22–5.81)
RDW: 13.2 % (ref 11.5–15.5)
WBC: 14.5 10*3/uL — ABNORMAL HIGH (ref 4.0–10.5)

## 2013-12-13 LAB — PRO B NATRIURETIC PEPTIDE: PRO B NATRI PEPTIDE: 6530 pg/mL — AB (ref 0–125)

## 2013-12-13 MED ORDER — ONDANSETRON HCL 4 MG PO TABS: 4.0000 mg | ORAL_TABLET | Freq: Four times a day (QID) | ORAL | Status: DC | PRN

## 2013-12-13 MED ORDER — SERTRALINE HCL 50 MG PO TABS
50.0000 mg | ORAL_TABLET | Freq: Every day | ORAL | Status: DC
  Administered 2013-12-13 – 2013-12-16 (×4): 50 mg via ORAL
  Filled 2013-12-13 (×4): qty 1

## 2013-12-13 MED ORDER — CEFEPIME HCL 1 G IJ SOLR
1.0000 g | Freq: Three times a day (TID) | INTRAMUSCULAR | Status: DC
  Administered 2013-12-13: 1 g via INTRAVENOUS
  Filled 2013-12-13 (×2): qty 1

## 2013-12-13 MED ORDER — INSULIN GLARGINE 100 UNIT/ML ~~LOC~~ SOLN
25.0000 [IU] | Freq: Every day | SUBCUTANEOUS | Status: DC
  Administered 2013-12-13: 25 [IU] via SUBCUTANEOUS
  Filled 2013-12-13 (×2): qty 0.25

## 2013-12-13 MED ORDER — DEXTROSE 5 % IV SOLN
INTRAVENOUS | Status: AC
  Filled 2013-12-13: qty 1

## 2013-12-13 MED ORDER — SODIUM CHLORIDE 0.9 % IV BOLUS (SEPSIS)
1000.0000 mL | Freq: Once | INTRAVENOUS | Status: AC
  Administered 2013-12-13: 1000 mL via INTRAVENOUS

## 2013-12-13 MED ORDER — PIPERACILLIN-TAZOBACTAM 3.375 G IVPB 30 MIN
3.3750 g | Freq: Once | INTRAVENOUS | Status: DC
  Administered 2013-12-13: 3.375 g via INTRAVENOUS
  Filled 2013-12-13 (×2): qty 50

## 2013-12-13 MED ORDER — CHLORHEXIDINE GLUCONATE CLOTH 2 % EX PADS
6.0000 | MEDICATED_PAD | Freq: Every day | CUTANEOUS | Status: DC
  Administered 2013-12-13 – 2013-12-16 (×4): 6 via TOPICAL

## 2013-12-13 MED ORDER — VANCOMYCIN HCL IN DEXTROSE 750-5 MG/150ML-% IV SOLN
750.0000 mg | INTRAVENOUS | Status: DC
  Administered 2013-12-13 – 2013-12-15 (×3): 750 mg via INTRAVENOUS
  Filled 2013-12-13 (×3): qty 150

## 2013-12-13 MED ORDER — ACETAMINOPHEN 650 MG RE SUPP: 650.0000 mg | Freq: Four times a day (QID) | RECTAL | Status: DC | PRN

## 2013-12-13 MED ORDER — ACETAMINOPHEN 325 MG PO TABS
650.0000 mg | ORAL_TABLET | Freq: Four times a day (QID) | ORAL | Status: DC | PRN
  Administered 2013-12-13: 650 mg via ORAL
  Filled 2013-12-13 (×2): qty 2

## 2013-12-13 MED ORDER — SODIUM CHLORIDE 0.9 % IJ SOLN
3.0000 mL | Freq: Two times a day (BID) | INTRAMUSCULAR | Status: DC
  Administered 2013-12-13 – 2013-12-16 (×6): 3 mL via INTRAVENOUS

## 2013-12-13 MED ORDER — AMLODIPINE BESYLATE 5 MG PO TABS
5.0000 mg | ORAL_TABLET | Freq: Every day | ORAL | Status: DC
  Administered 2013-12-13 – 2013-12-16 (×4): 5 mg via ORAL
  Filled 2013-12-13 (×4): qty 1

## 2013-12-13 MED ORDER — INSULIN ASPART 100 UNIT/ML ~~LOC~~ SOLN
0.0000 [IU] | Freq: Three times a day (TID) | SUBCUTANEOUS | Status: DC
Start: 2013-12-13 — End: 2013-12-16
  Administered 2013-12-13: 7 [IU] via SUBCUTANEOUS
  Administered 2013-12-13: 3 [IU] via SUBCUTANEOUS
  Administered 2013-12-13: 5 [IU] via SUBCUTANEOUS
  Administered 2013-12-16: 2 [IU] via SUBCUTANEOUS

## 2013-12-13 MED ORDER — DEXTROSE 5 % IV SOLN
1.0000 g | INTRAVENOUS | Status: DC
  Administered 2013-12-14 – 2013-12-15 (×2): 1 g via INTRAVENOUS
  Filled 2013-12-13 (×3): qty 1

## 2013-12-13 MED ORDER — IPRATROPIUM-ALBUTEROL 0.5-2.5 (3) MG/3ML IN SOLN
3.0000 mL | Freq: Four times a day (QID) | RESPIRATORY_TRACT | Status: DC
Start: 2013-12-13 — End: 2013-12-16
  Administered 2013-12-13 – 2013-12-15 (×12): 3 mL via RESPIRATORY_TRACT
  Filled 2013-12-13 (×12): qty 3

## 2013-12-13 MED ORDER — ONDANSETRON HCL 4 MG/2ML IJ SOLN: 4.0000 mg | Freq: Four times a day (QID) | INTRAMUSCULAR | Status: DC | PRN

## 2013-12-13 MED ORDER — COLCHICINE 0.6 MG PO TABS
0.6000 mg | ORAL_TABLET | Freq: Two times a day (BID) | ORAL | Status: DC
  Administered 2013-12-13: 0.6 mg via ORAL
  Filled 2013-12-13: qty 1

## 2013-12-13 MED ORDER — SODIUM CHLORIDE 0.9 % IV SOLN
INTRAVENOUS | Status: AC
  Administered 2013-12-13: 02:00:00 via INTRAVENOUS

## 2013-12-13 MED ORDER — ATORVASTATIN CALCIUM 20 MG PO TABS
20.0000 mg | ORAL_TABLET | Freq: Every day | ORAL | Status: DC
  Administered 2013-12-13 – 2013-12-16 (×4): 20 mg via ORAL
  Filled 2013-12-13 (×4): qty 1

## 2013-12-13 MED ORDER — SODIUM CHLORIDE 0.9 % IV SOLN
INTRAVENOUS | Status: DC
  Administered 2013-12-13: 23:00:00 via INTRAVENOUS

## 2013-12-13 MED ORDER — VANCOMYCIN HCL IN DEXTROSE 1-5 GM/200ML-% IV SOLN
1000.0000 mg | Freq: Once | INTRAVENOUS | Status: AC
  Administered 2013-12-13: 1000 mg via INTRAVENOUS
  Filled 2013-12-13: qty 200

## 2013-12-13 MED ORDER — MUPIROCIN 2 % EX OINT
1.0000 | TOPICAL_OINTMENT | Freq: Two times a day (BID) | CUTANEOUS | Status: DC
Start: 2013-12-13 — End: 2013-12-16
  Administered 2013-12-13 – 2013-12-16 (×7): 1 via NASAL
  Filled 2013-12-13: qty 22

## 2013-12-13 MED ORDER — ALBUTEROL SULFATE (2.5 MG/3ML) 0.083% IN NEBU
2.5000 mg | INHALATION_SOLUTION | RESPIRATORY_TRACT | Status: DC | PRN
Start: 2013-12-13 — End: 2013-12-16

## 2013-12-13 MED ORDER — INSULIN ASPART 100 UNIT/ML ~~LOC~~ SOLN
0.0000 [IU] | Freq: Every day | SUBCUTANEOUS | Status: DC
  Administered 2013-12-13 – 2013-12-15 (×2): 2 [IU] via SUBCUTANEOUS

## 2013-12-13 MED ORDER — COLCHICINE 0.6 MG PO TABS
0.6000 mg | ORAL_TABLET | ORAL | Status: DC
  Administered 2013-12-15: 0.6 mg via ORAL
  Filled 2013-12-13: qty 1

## 2013-12-13 NOTE — Progress Notes (Signed)
ANTIBIOTIC CONSULT NOTE - INITIAL  Pharmacy Consult for Vancomycin and Cefepime Indication: pneumonia  No Known Allergies  Patient Measurements: Height: 5\' 8"  (172.7 cm) Weight: 150 lb 4.8 oz (68.176 kg) IBW/kg (Calculated) : 68.4  Vital Signs: Temp: 98.1 F (36.7 C) (03/02 0610) Temp src: Oral (03/02 0610) BP: 132/78 mmHg (03/02 0936) Pulse Rate: 78 (03/02 0936) Intake/Output from previous day:   Intake/Output from this shift: Total I/O In: 240 [P.O.:240] Out: 2 [Urine:1; Stool:1]  Labs:  Recent Labs  12/12/13 2340 12/13/13 0253  WBC 17.0* 14.5*  HGB 9.3* 8.6*  PLT 241 200  CREATININE 2.70* 2.68*   Estimated Creatinine Clearance: 23.3 ml/min (by C-G formula based on Cr of 2.68). No results found for this basename: VANCOTROUGH, Leodis Binet, VANCORANDOM, GENTTROUGH, GENTPEAK, GENTRANDOM, TOBRATROUGH, TOBRAPEAK, TOBRARND, AMIKACINPEAK, AMIKACINTROU, AMIKACIN,  in the last 72 hours   Microbiology: Recent Results (from the past 720 hour(s))  CULTURE, BLOOD (ROUTINE X 2)     Status: None   Collection Time    12/13/13  2:46 AM      Result Value Ref Range Status   Specimen Description Blood BLOOD LEFT HAND   Final   Special Requests BOTTLES DRAWN AEROBIC AND ANAEROBIC 6CC   Final   Culture NO GROWTH <24 HRS   Final   Report Status PENDING   Incomplete  CULTURE, BLOOD (ROUTINE X 2)     Status: None   Collection Time    12/13/13  2:52 AM      Result Value Ref Range Status   Specimen Description Blood BLOOD RIGHT HAND   Final   Special Requests BOTTLES DRAWN AEROBIC AND ANAEROBIC 6CC   Final   Culture NO GROWTH <24 HRS   Final   Report Status PENDING   Incomplete  MRSA PCR SCREENING     Status: Abnormal   Collection Time    12/13/13  3:13 AM      Result Value Ref Range Status   MRSA by PCR POSITIVE (*) NEGATIVE Final   Comment:            The GeneXpert MRSA Assay (FDA     approved for NASAL specimens     only), is one component of a     comprehensive MRSA  colonization     surveillance program. It is not     intended to diagnose MRSA     infection nor to guide or     monitor treatment for     MRSA infections.     RESULT CALLED TO, READ BACK BY AND VERIFIED WITH:     WATKINS,T. AT 1012 ON 12/13/2013 BY BAUGHAM,M.   Medical History: Past Medical History  Diagnosis Date  . Diabetes mellitus   . Hypertension   . Stroke   . Kidney disease   . Aphasia     Medications:  Scheduled:  . amLODipine  5 mg Oral Daily  . atorvastatin  20 mg Oral Daily  . [START ON 12/14/2013] ceFEPime (MAXIPIME) IV  1 g Intravenous Q24H  . colchicine  0.6 mg Oral BID  . insulin aspart  0-5 Units Subcutaneous QHS  . insulin aspart  0-9 Units Subcutaneous TID WC  . insulin glargine  25 Units Subcutaneous QHS  . ipratropium-albuterol  3 mL Nebulization Q6H  . sertraline  50 mg Oral Daily  . sodium chloride  3 mL Intravenous Q12H  . vancomycin  750 mg Intravenous Q24H   Assessment: 73yo male admitted with respiratory failure secondary  to HCAP.  Pt started on Cefepime and Vancomycin.  Pt has renal insufficiency.  Estimated Creatinine Clearance: 23.3 ml/min (by C-G formula based on Cr of 2.68). Pt received Vancomycin 1000mg  IV on admission.  Vancomycin 3/1 >> Cefepime 3/2 >>  Goal of Therapy:  Vancomycin trough level 15-20 mcg/ml  Plan:  Vancomycin 750mg  IV q24hrs Check trough at steady state Cefepime 1gm IV q24hrs Monitor labs, renal fxn, and cultures  Valrie HartHall, Shanese Riemenschneider A 12/13/2013,10:54 AM

## 2013-12-13 NOTE — Progress Notes (Signed)
Patient ID: Brady [name], male   DOB: 09/14/1941, 73 y.o.   MRN: [id number]                PROGRESS NOTE  DATE:  12/10/2013    FACILITY: [facility]    LEVEL OF CARE:   SNF   Acute Visit   CHIEF COMPLAINT:  Follow up right hand pain.    HISTORY OF PRESENT ILLNESS:  This is a patient whom I inherited earlier this month.  He has an acute dominant hemisphere stroke complicated by TPA-induced intraparenchymal hemorrhage.  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    Apparently over the last month, he has had exquisite pain in the right hand and wrist.  I had given him a course of prednisone.  I believe Dr. Ouida SillsFagan did the same thing.  This does not seem to have helped.  He has chronic renal insufficiency and is a very poor candidate for NSAIDs, or for colchicine for that matter.    LABORATORY DATA:   The original lab work I did showed a uric acid level of 7.6.    His albumin was 2.6.    BUN was 45, creatinine of 2.10.    His hemoglobin A1c was 6.8.    PHYSICAL EXAMINATION:   MUSCULOSKELETAL:   EXTREMITIES:   RIGHT UPPER EXTREMITY:  There is exquisite pain involving the right wrist, right metacarpophalangeal joints.  I really do not think this is a regional pain syndrome.  I think this also involves his DIPs.  I think there is warmth over his metacarpophalangeal joints which more likely establishes this as an arthritis than a hand syndrome or neuropathic cause of his pain.    ASSESSMENT/PLAN:  Acute gout or pseudogout.  He was started on colchicine at 0.6 b.i.d. for the hand although the renal insufficiency is worrisome.  I would like to monitor this.  The peak uric acid level was at 9.6.  The only option here would be a higher dose of steroids.    CPT CODE: [id number]

## 2013-12-13 NOTE — ED Notes (Signed)
Dr. Krishnan at bedside. 

## 2013-12-13 NOTE — Clinical Social Work Psychosocial (Signed)
     Clinical Social Work Department BRIEF PSYCHOSOCIAL ASSESSMENT 12/13/2013  Patient:  Brady Frazier, Brady Frazier     Account Number:  [id number]     Admit date:  12/12/2013  Clinical Social Worker:  Edwyna Shell, [place]  Date/Time:  12/13/2013 02:00 PM  Referred by:  CSW  Date Referred:  12/13/2013 Referred for  SNF Placement   Other Referral:   Interview type:  Family Other interview type:   Also spoke w [place]    PSYCHOSOCIAL DATA Living Status:  FACILITY Admitted from facility:  [facility] Level of care:  [place] Primary support name:  Brady Frazier Primary support relationship to patient:  SPOUSE Degree of support available:   Son, Brady Frazier is Healthcare POA, wife supportive but lives at [facility] and does not drive    CURRENT CONCERNS Current Concerns  Post-Acute Placement   Other Concerns:    SOCIAL WORK ASSESSMENT / PLAN CSW met w wife at patient bedside, patient essentially nonverbal post stroke approx one year ago.  Is only able to respond w minimal verbalizations and appears to track w his eyes.  Wife says he had stroke while eating breakfast at home, she is retired Therapist, sports and recognized symptoms.  "I did not think he was going to live", patient has significant residual impairment and has been resident of [facility] approx one year.  Wife would like him to return at discharge.  Wife lives at [facility], says her ability to visit is limited by her ability to find transportation. Expresses sadness about husband's current condition, and some frustration at husband's physical decline.  Has noted husband's hands have swollen while at SNF, suggested that SNF provide washcloth for husband to hold.  Initially facility was reluctant to do this without MD order, but wife is appreciative of this being done at [facility] and hopes it can continue at [facility]    Spoke w Marianna Fuss at [facility], confirmed that patient is one of their  residents and may return at discharge. Facility assists w all ADLs,patient is totally dependent and speaks only w grunts.   Patient has [facility] and Medicaid, is currently placed at facility under Medicaid days.   Assessment/plan status:  Psychosocial Support/Ongoing Assessment of Needs Other assessment/ plan:   Information/referral to community resources:   Return to [place]: Wife expressed desire for patient to return to [facility] at discharge; wants comfort measures (like washcloths to hold in hand to prevent excessive squeezing) provided by facility.  RN made aware of wife's concern that patient is in pain.

## 2013-12-13 NOTE — Progress Notes (Signed)
UR chart review completed.  

## 2013-12-13 NOTE — Progress Notes (Addendum)
Inpatient Diabetes Program Recommendations  AACE/ADA: New Consensus Statement on Inpatient Glycemic Control (2013)  Target Ranges:  Prepandial:   less than 140 mg/dL      Peak postprandial:   less than 180 mg/dL (1-2 hours)      Critically ill patients:  140 - 180 mg/dL   Reason for Visit: Results for Brady Frazier, Brady Frazier (MRN [id number]) as of 12/13/2013 10:32  Ref. Range 12/13/2013 07:40  Glucose-Capillary Latest Range: 70-99 mg/dL 664248 (H)   Diabetes history: Type 2 Diabetes Outpatient Diabetes medications: Lantus 25 units q HS Current orders for Inpatient glycemic control: Lantus 25 units q HS (to start tonight), sensitive Novolog tid with meals and HS No recommendations at this time.  Will follow.    Beryl MeagerJenny Olando Willems, RN, BC-ADM Inpatient Diabetes Coordinator Pager [phone]

## 2013-12-13 NOTE — ED Provider Notes (Signed)
CSN: [id number]     Arrival date & time 12/12/13  2219 History  This chart was scribed for Dione Boozeavid Shantoria Ellwood, MD by Bennett Scrapehristina Taylor, ED Scribe. This patient was seen in room APA02/APA02 and the patient's care was started at 12:03 AM.   Chief Complaint  Patient presents with  . Pneumonia   Level 5 Caveat- AMS  The history is provided by the EMS personnel. No language interpreter was used.    HPI Comments: Brady Frazier is a 73 y.o. male brought [facility] by [facility], who presents to the Emergency Department for a PNA diagnosis. Pt was in radiology tonight and dx with PNA. Pt was returned to his room and EMS was called. Due to pt's current AMS, he is unable to provide any information.   Past Medical History  Diagnosis Date  . Diabetes mellitus   . Hypertension   . Stroke   . Kidney disease   . Aphasia    Past Surgical History  Procedure Laterality Date  . Stomach surgery    . Tee without cardioversion N/A 02/25/2013    Procedure: TRANSESOPHAGEAL ECHOCARDIOGRAM (TEE);  Surgeon: Lewayne BuntingBrian S Crenshaw, MD;  [facility];  Service: Cardiovascular;  Laterality: N/A;  Rosann Auerbachtrish Fawn Kirk/ja   No family history on file. History  Substance Use Topics  . Smoking status: Never Smoker   . Smokeless tobacco: Not on file  . Alcohol Use: No    Review of Systems  Unable to perform ROS: Mental status change      Allergies  Review of patient's allergies indicates no known allergies.  Home Medications   Current Outpatient Rx  Name  Route  Sig  Dispense  Refill  . acetaminophen (TYLENOL) 325 MG tablet   Oral   Take 650 mg by mouth every 4 (four) hours as needed. Pain/fever         . colchicine 0.6 MG tablet   Oral   Take 0.6 mg by mouth 2 (two) times daily.         . insulin glargine (LANTUS) 100 UNIT/ML injection   Subcutaneous   Inject 25 Units into the skin at bedtime.         Marland Kitchen. ipratropium-albuterol (DUONEB) 0.5-2.5 (3) MG/3ML SOLN   Nebulization   Take 3 mLs by  nebulization every 6 (six) hours.         . traMADol (ULTRAM) 50 MG tablet   Oral   Take 50 mg by mouth 3 (three) times daily as needed. pain         . amLODipine (NORVASC) 5 MG tablet   Oral   Take 5 mg by mouth daily.         Marland Kitchen. atorvastatin (LIPITOR) 20 MG tablet   Oral   Take 20 mg by mouth daily.         . hydrochlorothiazide (HYDRODIURIL) 25 MG tablet   Oral   Take 1 tablet (25 mg total) by mouth daily.   30 tablet   2   . sertraline (ZOLOFT) 50 MG tablet   Oral   Take 1 tablet (50 mg total) by mouth daily.   30 tablet   3    Triage Vitals: BP 113/96  Pulse 96  Temp(Src) 100.6 F (38.1 C) (Rectal)  Resp 20  SpO2 95%  Physical Exam  Nursing note and vitals reviewed. Constitutional: He appears well-developed.  Opens eyes to voice, responds to pain but does not follow commands  HENT:  Head: Normocephalic  and atraumatic.  Neck: Neck supple. No tracheal deviation present.  Cardiovascular: Normal rate and regular rhythm.   Pulmonary/Chest: Effort normal. No respiratory distress. He has rales.  Bilateral rales in the mid lung fields   Musculoskeletal:  Flexion contractures of arms and legs, worse on the left   Neurological:  Spastic left hemiparesis   Skin: Skin is warm and dry.  Psychiatric:  Unable to perform     ED Course  Procedures (including critical care time)  Medications  vancomycin (VANCOCIN) IVPB 1000 mg/200 mL premix (1,000 mg Intravenous New Bag/Given 12/13/13 0017)  piperacillin-tazobactam (ZOSYN) IVPB 3.375 g (not administered)     DIAGNOSTIC STUDIES: Oxygen Saturation is 95% on NRB, adequate by my interpretation.    COORDINATION OF CARE: 12:10 AM-Will start pt on antibiotics and will check BMP and CBC   Labs Review Results for orders placed during the hospital encounter of 12/12/13  BASIC METABOLIC PANEL      Result Value Ref Range   Sodium 132 (*) 137 - 147 mEq/L   Potassium 3.8  3.7 - 5.3 mEq/L   Chloride 97  96 - 112  mEq/L   CO2 22  19 - 32 mEq/L   Glucose, Bld 346 (*) 70 - 99 mg/dL   BUN 64 (*) 6 - 23 mg/dL   Creatinine, Ser 1.91 (*) 0.50 - 1.35 mg/dL   Calcium 8.0 (*) 8.4 - 10.5 mg/dL   GFR calc non Af Amer 22 (*) >90 mL/min   GFR calc Af Amer 25 (*) >90 mL/min  CBC WITH DIFFERENTIAL      Result Value Ref Range   WBC 17.0 (*) 4.0 - 10.5 K/uL   RBC 3.04 (*) 4.22 - 5.81 MIL/uL   Hemoglobin 9.3 (*) 13.0 - 17.0 g/dL   HCT 47.8 (*) 29.5 - 62.1 %   MCV 85.2  78.0 - 100.0 fL   MCH 30.6  26.0 - 34.0 pg   MCHC 35.9  30.0 - 36.0 g/dL   RDW 30.8  65.7 - 84.6 %   Platelets 241  150 - 400 K/uL   Neutrophils Relative % 80 (*) 43 - 77 %   Neutro Abs 13.6 (*) 1.7 - 7.7 K/uL   Lymphocytes Relative 11 (*) 12 - 46 %   Lymphs Abs 1.9  0.7 - 4.0 K/uL   Monocytes Relative 8  3 - 12 %   Monocytes Absolute 1.4 (*) 0.1 - 1.0 K/uL   Eosinophils Relative 0  0 - 5 %   Eosinophils Absolute 0.0  0.0 - 0.7 K/uL   Basophils Relative 0  0 - 1 %   Basophils Absolute 0.0  0.0 - 0.1 K/uL  PRO B NATRIURETIC PEPTIDE      Result Value Ref Range   Pro B Natriuretic peptide (BNP) 6530.0 (*) 0 - 125 pg/mL   Imaging Review Dg Chest 1 View  12/12/2013   CLINICAL DATA:  Short of breath.  Decreased oxygen saturation.  EXAM: CHEST - 1 VIEW  COMPARISON:  02/1913  FINDINGS: There is airspace consolidation in the mid lungs, more dense on the left than the right. This could reflect asymmetric edema or bilateral pneumonia.  No pleural effusion or pneumothorax the cardiac silhouette is normal in size. The aorta is uncoiled. No mediastinal or hilar masses.  IMPRESSION: Bilateral airspace lung opacities. Findings may reflect multifocal pneumonia or asymmetric edema.   Electronically Signed   By: Amie Portland M.D.   On: 12/12/2013 21:13   Images  viewed by me.   EKG Interpretation   Date/Time:  Monday December 13 2013 00:19:50 EST Ventricular Rate:  94 PR Interval:  254 QRS Duration: 100 QT Interval:  356 QTC Calculation: 445 R Axis:    -41 Text Interpretation:  Sinus rhythm with 1st degree A-V block Left axis  deviation Minimal voltage criteria for LVH, may be normal variant  Nonspecific ST abnormality Abnormal ECG When compared with ECG of  20-Feb-2013 12:02, QT has lengthened Confirmed by [facility]  MD, Gema Ringold ([id number])  on 12/13/2013 1:28:03 AM      MDM   Final diagnoses:  HCAP (healthcare-associated pneumonia)  Renal insufficiency  Anemia  Hyponatremia  Hyperglycemia   Healthcare associated pneumonia. I reviewed his x-ray from the nursing home which does show bilateral midlung infiltrates with air bronchograms. 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 He is started on vancomycin and Zosyn for pneumonia. Laboratory workup is obtained which does show leukocytosis, stable anemia, renal insufficiency which is somewhat worse than her baseline with significant elevation of BUN relative to creatinine. Case is discussed with Dr. Rito Ehrlich of [facility] who agrees to admit the patient.  I personally performed the services described in this documentation, which was scribed in my presence. The recorded information has been reviewed and is accurate.       Dione Booze, MD 12/13/13 [phone]

## 2013-12-13 NOTE — H&P (Signed)
[facility] History and Physical  Brady Frazier RSW:[id number] DOB: 1940/12/04 DOA: 12/12/2013   PCP: Cyndee Brightly, MD  Specialists: None  Chief Complaint: Pneumonia  HPI: Brady Frazier is a 73 y.o. male with a past medical history diabetes, hypertension, stroke, and 2014. Status post intracerebral hemorrhage, secondary to TPA, who currently resides in a skilled nursing facility. His baseline mental status is not clearly known. He was brought into the emergency department from radiology after the pneumonia was diagnosed based on a chest x-ray. Unfortunately, no further history is available. Patient is unable to provide any information. No family is available.  Home Medications: Prior to Admission medications   Medication Sig Start Date End Date Taking? Authorizing Provider  acetaminophen (TYLENOL) 325 MG tablet Take 650 mg by mouth every 4 (four) hours as needed. Pain/fever   Yes Historical Provider, MD  colchicine 0.6 MG tablet Take 0.6 mg by mouth 2 (two) times daily.   Yes Historical Provider, MD  insulin glargine (LANTUS) 100 UNIT/ML injection Inject 25 Units into the skin at bedtime.   Yes Historical Provider, MD  ipratropium-albuterol (DUONEB) 0.5-2.5 (3) MG/3ML SOLN Take 3 mLs by nebulization every 6 (six) hours.   Yes Historical Provider, MD  traMADol (ULTRAM) 50 MG tablet Take 50 mg by mouth 3 (three) times daily as needed. pain   Yes Historical Provider, MD  amLODipine (NORVASC) 5 MG tablet Take 5 mg by mouth daily.    Historical Provider, MD  atorvastatin (LIPITOR) 20 MG tablet Take 20 mg by mouth daily.    Historical Provider, MD  hydrochlorothiazide (HYDRODIURIL) 25 MG tablet Take 1 tablet (25 mg total) by mouth daily. 02/26/13   Donzetta Starch, NP  sertraline (ZOLOFT) 50 MG tablet Take 1 tablet (50 mg total) by mouth daily. 11/23/13   Ricard Dillon, MD    Allergies: No Known Allergies  Past Medical History: Past Medical History  Diagnosis Date  . Diabetes  mellitus   . Hypertension   . Stroke   . Kidney disease   . Aphasia     Past Surgical History  Procedure Laterality Date  . Stomach surgery    . Tee without cardioversion N/A 02/25/2013    Procedure: TRANSESOPHAGEAL ECHOCARDIOGRAM (TEE);  Surgeon: Lelon Perla, MD;  Location: [facility] ENDOSCOPY;  Service: Cardiovascular;  Laterality: N/A;  Wannetta Sender Greggory Brandy    Social History: He lives in a skilled nursing facility. No further information is available.  Family History: unable to obtain due to his mental status  Review of Systems - unable to do due to his mental status  Physical Examination  Filed Vitals:   12/12/13 2345 12/13/13 0029 12/13/13 0100 12/13/13 0123  BP: 113/96 103/60 107/80   Pulse: 96 93 87   Temp: 100.6 F (38.1 C)     TempSrc: Rectal     Resp: 20 18    SpO2: 95% 92% 92% 92%    BP 107/80  Pulse 87  Temp(Src) 100.6 F (38.1 C) (Rectal)  Resp 18  SpO2 92%  General appearance: appears stated age, distracted, no distress and uncooperative Head: Normocephalic, without obvious abnormality, atraumatic Eyes: conjunctivae/corneas clear. PERRL, EOM's intact. Throat: dry mm without oral lesions Neck: no adenopathy, no carotid bruit, no JVD, supple, symmetrical, trachea midline and thyroid not enlarged, symmetric, no tenderness/mass/nodules Resp: Diminished air entry bilaterally in the bases without any definite wheezing or crackles Cardio: regular rate and rhythm, S1, S2 normal, no murmur, click, rub or gallop GI: soft, non-tender; bowel sounds  normal; no masses,  no organomegaly Extremities: extremities normal, atraumatic, no cyanosis or edema Pulses: 2+ and symmetric Skin: Skin color, texture, turgor normal. No rashes or lesions Lymph nodes: Cervical, supraclavicular, and axillary nodes normal. Neurologic: He is alert. Distracted, and uncooperative, unable to answer any questions. Moving both his legs. He is very rigid. Right upper extremity is moving less than the left.  Limited neurological examination  Laboratory Data: Results for orders placed during the hospital encounter of 12/12/13 (from the past 48 hour(s))  BASIC METABOLIC PANEL     Status: Abnormal   Collection Time    12/12/13 11:40 PM      Result Value Ref Range   Sodium 132 (*) 137 - 147 mEq/L   Potassium 3.8  3.7 - 5.3 mEq/L   Chloride 97  96 - 112 mEq/L   CO2 22  19 - 32 mEq/L   Glucose, Bld 346 (*) 70 - 99 mg/dL   BUN 64 (*) 6 - 23 mg/dL   Creatinine, Ser 2.70 (*) 0.50 - 1.35 mg/dL   Calcium 8.0 (*) 8.4 - 10.5 mg/dL   GFR calc non Af Amer 22 (*) >90 mL/min   GFR calc Af Amer 25 (*) >90 mL/min   Comment: (NOTE)     The eGFR has been calculated using the CKD EPI equation.     This calculation has not been validated in all clinical situations.     eGFR's persistently <90 mL/min signify possible Chronic Kidney     Disease.  CBC WITH DIFFERENTIAL     Status: Abnormal   Collection Time    12/12/13 11:40 PM      Result Value Ref Range   WBC 17.0 (*) 4.0 - 10.5 K/uL   RBC 3.04 (*) 4.22 - 5.81 MIL/uL   Hemoglobin 9.3 (*) 13.0 - 17.0 g/dL   HCT 25.9 (*) 39.0 - 52.0 %   MCV 85.2  78.0 - 100.0 fL   MCH 30.6  26.0 - 34.0 pg   MCHC 35.9  30.0 - 36.0 g/dL   RDW 13.3  11.5 - 15.5 %   Platelets 241  150 - 400 K/uL   Neutrophils Relative % 80 (*) 43 - 77 %   Neutro Abs 13.6 (*) 1.7 - 7.7 K/uL   Lymphocytes Relative 11 (*) 12 - 46 %   Lymphs Abs 1.9  0.7 - 4.0 K/uL   Monocytes Relative 8  3 - 12 %   Monocytes Absolute 1.4 (*) 0.1 - 1.0 K/uL   Eosinophils Relative 0  0 - 5 %   Eosinophils Absolute 0.0  0.0 - 0.7 K/uL   Basophils Relative 0  0 - 1 %   Basophils Absolute 0.0  0.0 - 0.1 K/uL  PRO B NATRIURETIC PEPTIDE     Status: Abnormal   Collection Time    12/12/13 11:40 PM      Result Value Ref Range   Pro B Natriuretic peptide (BNP) 6530.0 (*) 0 - 125 pg/mL    Radiology Reports: Dg Chest 1 View  12/12/2013   CLINICAL DATA:  Short of breath.  Decreased oxygen saturation.  EXAM: CHEST  - 1 VIEW  COMPARISON:  02/1913  FINDINGS: There is airspace consolidation in the mid lungs, more dense on the left than the right. This could reflect asymmetric edema or bilateral pneumonia.  No pleural effusion or pneumothorax the cardiac silhouette is normal in size. The aorta is uncoiled. No mediastinal or hilar masses.  IMPRESSION: Bilateral airspace  lung opacities. Findings may reflect multifocal pneumonia or asymmetric edema.   Electronically Signed   By: Lajean Manes M.D.   On: 12/12/2013 21:13    Electrocardiogram: Sinus rhythm with first degree AV block. 94 beats per minute. Left axis deviation. No definite Q waves. No concerning ST or T-wave changes. Similar to previous EKG  Problem List  Principal Problem:   HCAP (healthcare-associated pneumonia) Active Problems:   Acute ischemic left middle cerebral artery (MCA) stroke   Cerebral parenchymal hemorrhage, post tpa   Essential hypertension, benign   PFO (patent foramen ovale)   DM type 2 (diabetes mellitus, type 2)   Acute on chronic renal failure   Acute respiratory failure with hypoxia   Assessment: This is an unfortunate 73 year old, African American male, who presents with respiratory failure, secondary to bilateral pneumonia. He has HCAP. Aspiration cannot be ruled out. It is noted that his BNP is elevated. However, considering low-grade fever and elevated WBC this is most likely pneumonia rather than pulmonary edema. Echocardiogram report from last year was reviewed and it showed normal systolic function.  Plan: #1 acute respiratory failure, secondary to healthcare associated pneumonia: We will treat him with vancomycin and cefepime. Oxygen will be provided. He is saturating in the mid 90s on oxygen by nasal cannula. Swallow evaluation will be done. He is on a pured diet at the nursing facility, which will be continued here for now. Nebulizer treatments were provided.  #2 encephalopathy in the setting of previous history of  stroke: It appears that patient has declined over the last many months. He was diagnosed with a stroke last year in May. This was complicated by a post TPA intracerebral hemorrhage. Baseline mental status is not clearly known.  #3 diabetes mellitus, type II, on insulin: He has hyperglycemia. Lantus will be given tonight. He'll be placed on a sliding scale coverage. HbA1c will be checked in the morning.  #4 acute on chronic renal failure: Baseline creatinine seems to be about 2.2-2.3. His BUN is also significantly elevated. We will given him IV fluids as he is clinically dehydrated. It is noted that his BNP is elevated.  #5 history of hypertension: Monitor blood pressures closely. Treat accordingly.   DVT Prophylaxis: SCDs Code Status: DO NOT RESUSCITATE Family Communication: No family is available  Disposition Plan: Admit to telemetry.   Further management decisions will depend on results of further testing and patient's response to treatment.  [facility]  [facility] Pager [phone]  If 7PM-7AM, please contact night-coverage [email or web address] Password [email or web address]  12/13/2013, 1:43 AM

## 2013-12-13 NOTE — Care Management Note (Addendum)
    Page 1 of 1   12/16/2013     1:23:55 PM   CARE MANAGEMENT NOTE 12/16/2013  Patient:  Brady Frazier,Brady Frazier   Account Number:  [id number]  Date Initiated:  12/13/2013  Documentation initiated by:  Sharrie RothmanBLACKWELL,Conchita Truxillo C  Subjective/Objective Assessment:   [facility]. Pt will return to facility when medically stable.     Action/Plan:   CSW to arrange discharge to facility when medically stable.   Anticipated DC Date:  12/17/2013   Anticipated DC Plan:  SKILLED NURSING FACILITY  In-house referral  Clinical Social Worker      DC Planning Services  CM consult      Choice offered to / List presented to:             Status of service:  Completed, signed off Medicare Important Message given?   (If response is "NO", the following Medicare IM given date fields will be blank) Date Medicare IM given:   Date Additional Medicare IM given:    Discharge Disposition:  SKILLED NURSING FACILITY  Per UR Regulation:    If discussed at Long Length of Stay Meetings, dates discussed:    Comments:  12/16/13 1320 Arlyss Queenammy Demitra Danley, RN BSN CM [facility]. CSW to arrange discharge to facility. Pt unable to sign IM and wife unavailable by phone or person to sign IM. Copy of IM sent with pt to facility.  12/13/13 1130 Arlyss Queenammy Council Munguia, RN BSN CM

## 2013-12-13 NOTE — Evaluation (Signed)
Clinical/Bedside Swallow Evaluation  Patient Details  Name: Brady Frazier MRN: [id number] Date of Birth: [birth year]  Today's Date: 12/13/2013 Time: 1000-1025 SLP Time Calculation (min): 25 min  Past Medical History:  Past Medical History  Diagnosis Date  . Diabetes mellitus   . Hypertension   . Stroke   . Kidney disease   . Aphasia    Past Surgical History:  Past Surgical History  Procedure Laterality Date  . Stomach surgery    . Tee without cardioversion N/A 02/25/2013    Procedure: TRANSESOPHAGEAL ECHOCARDIOGRAM (TEE);  Surgeon: Lewayne BuntingBrian S Crenshaw, MD;  [facility];  Service: Cardiovascular;  Laterality: N/A;  Rosann Auerbachtrish Fawn Kirk/ja   HPI:   Brady Frazier is a 73 y.o. male with a past medical history diabetes, hypertension, stroke, and 2014. Status post intracerebral hemorrhage, secondary to TPA, who currently resides in a skilled nursing facility. His baseline mental status is not clearly known. He was brought into the emergency department from radiology after the pneumonia was diagnosed based on a chest x-ray. Unfortunately, no further history is available. Patient is unable to provide any information. No family is available.   Assessment / Plan / Recommendation Clinical Impression  Brady Frazier was seen at bedside for clinical swallow evaluation and assessed with thin by cup and straw, puree, mechanical soft, and regular textures. Pt with poor labial closure and ultimately oral control with cup sips thin and demonstrated improved oral control with straw sips. Pt barely opens his mouth for bites puree, however with verbal cues he opens widely when presented with mech soft textures. Pt with decreased rotary chewing and lingual movement with solids and benefits from alternating consistencies. SLP spoke with SLP at [facility] and pt was on a puree diet and thin liquids, but they had considered referring him for MBSS. Pt inconsistently tolerated mech soft textures due to inconsistent  alertness/attention to task. He often pocketed solids which had to be removed. Will pursue MBSS while in acute setting, however unable to complete today. Change diet to mech soft and thin liquids with MBSS planned for tomorrow.    Aspiration Risk  Mild    Diet Recommendation Dysphagia 3 (Mechanical Soft);Thin liquid   Liquid Administration via: Straw Medication Administration: Whole meds with liquid Supervision: Staff to assist with self feeding;Full supervision/cueing for compensatory strategies Compensations: Slow rate;Small sips/bites;Check for pocketing;Follow solids with liquid Postural Changes and/or Swallow Maneuvers: Seated upright 90 degrees    Other  Recommendations Recommended Consults: MBS Oral Care Recommendations: Oral care BID Other Recommendations: Clarify dietary restrictions   Follow Up Recommendations  Skilled Nursing facility    Frequency and Duration min 2x/week  1 week       SLP Swallow Goals  MBSS pending for tomorrow   Swallow Study Prior Functional Status   [facility] and [facility].    General Date of Onset: 12/12/13 HPI:  Brady Frazier is a 73 y.o. male with a past medical history diabetes, hypertension, stroke, and 2014. Status post intracerebral hemorrhage, secondary to TPA, who currently resides in a skilled nursing facility. His baseline mental status is not clearly known. He was brought into the emergency department from radiology after the pneumonia was diagnosed based on a chest x-ray. Unfortunately, no further history is available. Patient is unable to provide any information. No family is available. Type of Study: Bedside swallow evaluation Previous Swallow Assessment[facility], no objective study Diet Prior to this Study: Dysphagia 1 (puree);Thin liquids Temperature Spikes Noted:  Yes Respiratory Status: Nasal cannula History of Recent Intubation: No Behavior/Cognition: Alert;Decreased sustained attention;Requires  cueing Oral Cavity - Dentition: Adequate natural dentition Self-Feeding Abilities: Needs assist Patient Positioning: Upright in bed Baseline Vocal Quality: Clear Volitional Cough: Cognitively unable to elicit Volitional Swallow: Unable to elicit    Oral/Motor/Sensory Function Overall Oral Motor/Sensory Function: Other (comment) (unable to formally assess due to decreased mentation)   Ice Chips Ice chips: Within functional limits Presentation: Spoon   Thin Liquid Thin Liquid: Impaired Presentation: Cup;Straw Oral Phase Impairments: Reduced labial seal (improved oral control with straw)    Nectar Thick Nectar Thick Liquid: Not tested   Honey Thick Honey Thick Liquid: Not tested   Puree Puree: Within functional limits Presentation: Spoon   Solid       Solid: Impaired Presentation: Spoon Oral Phase Impairments: Reduced lingual movement/coordination;Impaired mastication (decreased rotary chewing, but adequate) Oral Phase Functional Implications: Oral residue      Thank you,  Havery Moros, CCC-SLP [phone]  PORTER,DABNEY 12/13/2013,12:02 PM

## 2013-12-13 NOTE — Progress Notes (Signed)
Patient was admitted to the hospital earlier this morning by Dr. Rito EhrlichKrishnan.  Patient seen and examined, database reviewed.  He is a resident of a nursing facility and has been living there for approximately one year. He suffered a stroke approximately one year ago which was complicated by cerebral hemorrhage post TPA. The patient is minimally verbal and communicates with grunts indicating yes and no. The wife reports that he is bed/wheelchair bound. He is admitted to the hospital with pneumonia. Concern for HCP versus aspiration pneumonia. He's been started on broad-spectrum antibiotics. Fever she appears to have resolved the leukocytosis is improving. He does have chronic kidney disease and is currently receiving IV fluids. Will continue to monitor renal function. He has chronic anemia, likely due to chronic disease. We'll continue to follow. No obvious sources of bleeding. He will eventually discharged back to skilled nursing facility in improved. DO NOT RESUSCITATE. Discussed plan with his wife at the bedside.  Nashika Coker

## 2013-12-14 ENCOUNTER — Inpatient Hospital Stay (HOSPITAL_COMMUNITY): Payer: Medicare HMO

## 2013-12-14 LAB — GLUCOSE, CAPILLARY
Glucose-Capillary: 63 mg/dL — ABNORMAL LOW (ref 70–99)
Glucose-Capillary: 76 mg/dL (ref 70–99)
Glucose-Capillary: 84 mg/dL (ref 70–99)
Glucose-Capillary: 103 mg/dL — ABNORMAL HIGH (ref 70–99)
Glucose-Capillary: 120 mg/dL — ABNORMAL HIGH (ref 70–99)

## 2013-12-14 LAB — BASIC METABOLIC PANEL
BUN: 46 mg/dL — ABNORMAL HIGH (ref 6–23)
CO2: 23 meq/L (ref 19–32)
Calcium: 8.5 mg/dL (ref 8.4–10.5)
Chloride: 110 mEq/L (ref 96–112)
Creatinine, Ser: 2.44 mg/dL — ABNORMAL HIGH (ref 0.50–1.35)
GFR calc Af Amer: 28 mL/min — ABNORMAL LOW (ref >90)
GFR calc non Af Amer: 25 mL/min — ABNORMAL LOW (ref >90)
Glucose, Bld: 60 mg/dL — ABNORMAL LOW (ref 70–99)
POTASSIUM: 3.6 meq/L — AB (ref 3.7–5.3)
SODIUM: 144 meq/L (ref 137–147)

## 2013-12-14 LAB — CBC
HEMATOCRIT: 24.9 % — AB (ref 39.0–52.0)
Hemoglobin: 8.7 g/dL — ABNORMAL LOW (ref 13.0–17.0)
MCH: 30.2 pg (ref 26.0–34.0)
MCHC: 34.9 g/dL (ref 30.0–36.0)
MCV: 86.5 fL (ref 78.0–100.0)
Platelets: 237 10*3/uL (ref 150–400)
RBC: 2.88 MIL/uL — AB (ref 4.22–5.81)
RDW: 13.5 % (ref 11.5–15.5)
WBC: 13 10*3/uL — ABNORMAL HIGH (ref 4.0–10.5)

## 2013-12-14 LAB — STREP PNEUMONIAE URINARY ANTIGEN: STREP PNEUMO URINARY ANTIGEN: NEGATIVE

## 2013-12-14 MED ORDER — FUROSEMIDE 10 MG/ML IJ SOLN
40.0000 mg | Freq: Once | INTRAMUSCULAR | Status: AC
  Administered 2013-12-14: 40 mg via INTRAVENOUS
  Filled 2013-12-14: qty 4

## 2013-12-14 MED ORDER — FUROSEMIDE 10 MG/ML IJ SOLN
20.0000 mg | Freq: Once | INTRAMUSCULAR | Status: AC
  Administered 2013-12-14: 20 mg via INTRAVENOUS
  Filled 2013-12-14: qty 2

## 2013-12-14 MED ORDER — INSULIN GLARGINE 100 UNIT/ML ~~LOC~~ SOLN
10.0000 [IU] | Freq: Every day | SUBCUTANEOUS | Status: DC
  Administered 2013-12-14 – 2013-12-15 (×2): 10 [IU] via SUBCUTANEOUS
  Filled 2013-12-14 (×3): qty 0.1

## 2013-12-14 NOTE — Progress Notes (Signed)
Pt rhonchus and coarse crackles. spo2 87% on 6lpm cann. Talked with nurse to see if this is a change from earlier and this is a change. She will call MD and I will change O2 to 50% v. mask

## 2013-12-14 NOTE — Progress Notes (Signed)
Inpatient Diabetes Program Recommendations  AACE/ADA: New Consensus Statement on Inpatient Glycemic Control (2013)  Target Ranges:  Prepandial:   less than 140 mg/dL      Peak postprandial:   less than 180 mg/dL (1-2 hours)      Critically ill patients:  140 - 180 mg/dL   Results for Brady Frazier, Brady Frazier (MRN [id number]) as of 12/14/2013 07:40  Ref. Range 12/12/2013 23:40 12/13/2013 02:53 12/14/2013 05:16  Hemoglobin A1C Latest Range: <5.7 %  7.5 (H)   Glucose Latest Range: 70-99 mg/dL 829346 (H) 562315 (H) 60 (L)    Diabetes history: DM2 Outpatient Diabetes medications: Lantus 25 units QHS Current orders for Inpatient glycemic control: Lantus 25 units QHS, Novolog 0-9 units AC, Novolog 0-5 units HS  Inpatient Diabetes Program Recommendations Insulin - Basal: Please consider decreasing Lantus to 20 units QHS.  Thanks, Orlando PennerMarie An Lannan, RN, MSN, CCRN Diabetes Coordinator Inpatient Diabetes Program [phone] (Team Pager) [phone] (AP office) [phone] [facility] office)

## 2013-12-14 NOTE — Procedures (Signed)
Objective Swallowing Evaluation: Modified Barium Swallowing Study   Patient Details  Name: Brady Frazier MRN: [id number] Date of Birth: Jun 04, 1941  Today's Date: 12/14/2013 Time: 1441-1510 SLP Time Calculation (min): 29 min  Past Medical History:  Past Medical History  Diagnosis Date  . Diabetes mellitus   . Hypertension   . Stroke   . Kidney disease   . Aphasia    Past Surgical History:  Past Surgical History  Procedure Laterality Date  . Stomach surgery    . Tee without cardioversion N/A 02/25/2013    Procedure: TRANSESOPHAGEAL ECHOCARDIOGRAM (TEE);  Surgeon: Lewayne BuntingBrian S Crenshaw, MD;  [facility];  Service: Cardiovascular;  Laterality: N/A;  Rosann Auerbachtrish Fawn Kirk/ja   HPI:   Brady Frazier is a 73 y.o. male with a past medical history diabetes, hypertension, stroke, and 2014. Status post intracerebral hemorrhage, secondary to TPA, who currently resides in a skilled nursing facility. His baseline mental status is not clearly known. He was brought into the emergency department from radiology after the pneumonia was diagnosed based on a chest x-ray. Unfortunately, no further history is available. Patient is unable to provide any information. No family is available.     Assessment / Plan / Recommendation Clinical Impression  Dysphagia Diagnosis: Mild oral phase dysphagia;Mild pharyngeal phase dysphagia Clinical impression: Brady Frazier presents with mild sensorimotor based oropharyngeal dysphagia characterized by weak lingual manipulation, decreased mastication of solids, delay in swallow initiation triggering after completely filling the valleculae with some spilling to pyriform, decreased tongue base retraction resulting in lingual residuals, pooling in valleculae post swallow, and residuals in the lateral channels and pyriforms. Pt was cued verbally to repeat/dry swallow, but did not respond. Pt was presented with dry spoon to mouth in attempt to elicit a swallow which he eventually did,  however mild residuals persist. Brady Frazier is at risk for aspiration due to pharyngeal residue across consistencies. Continue mechanical soft diet and thin liquids with cues to swallow 2x for each bite/sip. Ensure oral cavity is clear at the end of meals.     Treatment Recommendation  Therapy as outlined in treatment plan below    Diet Recommendation Dysphagia 3 (Mechanical Soft);Thin liquid   Liquid Administration via: Straw Medication Administration: Crushed with puree Supervision: Staff to assist with self feeding;Full supervision/cueing for compensatory strategies Compensations: Slow rate;Small sips/bites;Check for pocketing;Follow solids with liquid Postural Changes and/or Swallow Maneuvers: Seated upright 90 degrees    Other  Recommendations Oral Care Recommendations: Oral care BID Other Recommendations: Clarify dietary restrictions   Follow Up Recommendations  Skilled Nursing facility    Frequency and Duration min 2x/week  1 week        General Date of Onset: 12/12/13 HPI:  Brady Frazier is a 73 y.o. male with a past medical history diabetes, hypertension, stroke, and 2014. Status post intracerebral hemorrhage, secondary to TPA, who currently resides in a skilled nursing facility. His baseline mental status is not clearly known. He was brought into the emergency department from radiology after the pneumonia was diagnosed based on a chest x-ray. Unfortunately, no further history is available. Patient is unable to provide any information. No family is available. Type of Study: Modified Barium Swallowing Study Reason for Referral: Objectively evaluate swallowing function Previous Swallow Assessment: [facility], no objective study Diet Prior to this Study: Dysphagia 3 (soft);Thin liquids Temperature Spikes Noted: No Respiratory Status: Nasal cannula History of Recent Intubation: No Behavior/Cognition: Alert;Decreased sustained attention;Requires cueing Oral Cavity -  Dentition: Adequate natural dentition Oral  Motor / Sensory Function: Impaired - see Bedside swallow eval Self-Feeding Abilities: Needs assist Patient Positioning: Upright in chair Baseline Vocal Quality: Clear Volitional Cough: Cognitively unable to elicit Volitional Swallow: Unable to elicit Anatomy: Within functional limits Pharyngeal Secretions: Not observed secondary MBS    Reason for Referral Objectively evaluate swallowing function   Oral Phase Oral Preparation/Oral Phase Oral Phase: Impaired Oral - Nectar Oral - Nectar Cup: Lingual/palatal residue Oral - Thin Oral - Thin Teaspoon: Within functional limits Oral - Thin Cup: Lingual/palatal residue Oral - Thin Straw: Lingual/palatal residue Oral - Solids Oral - Mechanical Soft: Impaired mastication;Weak lingual manipulation Oral - Regular: Impaired mastication;Weak lingual manipulation;Piecemeal swallowing;Lingual/palatal residue;Delayed oral transit   Pharyngeal Phase Pharyngeal Phase Pharyngeal Phase: Impaired Pharyngeal - Nectar Pharyngeal - Nectar Cup: Delayed swallow initiation;Premature spillage to valleculae;Premature spillage to pyriform sinuses;Reduced tongue base retraction;Pharyngeal residue - valleculae;Pharyngeal residue - pyriform sinuses;Lateral channel residue Pharyngeal - Thin Pharyngeal - Thin Teaspoon: Premature spillage to valleculae Pharyngeal - Thin Cup: Delayed swallow initiation;Premature spillage to valleculae;Premature spillage to pyriform sinuses;Pharyngeal residue - valleculae;Pharyngeal residue - pyriform sinuses;Lateral channel residue Pharyngeal - Thin Straw: Delayed swallow initiation;Premature spillage to valleculae;Premature spillage to pyriform sinuses;Pharyngeal residue - valleculae;Pharyngeal residue - pyriform sinuses;Lateral channel residue Pharyngeal - Solids Pharyngeal - Puree: Premature spillage to valleculae;Reduced tongue base retraction;Pharyngeal residue - valleculae;Premature spillage  to pyriform sinuses;Delayed swallow initiation (spills ) Pharyngeal - Mechanical Soft: Premature spillage to valleculae;Delayed swallow initiation;Reduced tongue base retraction;Pharyngeal residue - valleculae Pharyngeal - Regular: Premature spillage to valleculae;Delayed swallow initiation;Reduced tongue base retraction;Pharyngeal residue - valleculae  Cervical Esophageal Phase    GO    Cervical Esophageal Phase Cervical Esophageal Phase: WFL         Brady Frazier 12/14/2013, 4:06 PM

## 2013-12-14 NOTE — Progress Notes (Signed)
Patient weaned from Venti mask. Oxygen level 97% on O2 at 3 liters by [place].

## 2013-12-14 NOTE — Progress Notes (Signed)
Notified by RT that patient was on 6L of 02 and his Spo2 was 87%. RT also made me aware that the patient now had rhonchi breath sounds which was a change from my previous assessment this shift. MD was made notified. MD placed an order for IV lasix and stopped the IV fluids. Patient is in no distress and has no problems with breathing at this time. Patient is resting. Will continue to monitor patient.

## 2013-12-14 NOTE — Progress Notes (Signed)
[facility] PROGRESS NOTE  Brady Frazier RUE:[id number] DOB: 05-31-1941 DOA: 12/12/2013 PCP: Terald Sleeper, MD  Summary: this is a 73 year old male with multiple medical problems including prior stroke and subsequent intracerebral hemorrhage status post TPA who has been a resident of a nursing home for quite some time. He came to the emergency room with shortness of breath and was found to have a pneumonia. Started on IV fluids but unfortunately developed some signs of volume overload. He received one dose of Lasix with improvement of his symptoms. He's continued on IV fluids and intermittent Lasix. He can likely transition back to the nursing home in the next one to 2 days.   Assessment/Plan: 1. Acute respiratory failure continue to wean down oxygen as tolerated. Patient is normally on room air. 2. Healthcare associated pneumonia versus aspiration pneumonia. He's on broad-spectrum antibiotics. Swallow evaluation recommends dysphagia 3 and thin liquids. He has been afebrile. Can possibly transition to oral antibiotics tomorrow. 3. Encephalopathy. Appears to be at baseline. He is minimally conversive due to prior stroke and subsequent TPA related intracerebral hemorrhage. 4. Diabetes, on insulin. Patient had hypoglycemic episodes overnight. Will decrease Lantus dose. Likely related to decreased by mouth intake. 5. Acute on chronic renal failure. Mild improvement after receiving dose of Lasix. We will give one more dose. Continue to volume output and renal function. 6. Hypertension. Stable 7. Anemia, likely with chronic disease. No evidence of bleeding. Continue to follow  Code Status: DNR Family Communication: no family present Disposition Plan: return to SNF on discharge   Consultants:    Procedures:    Antibiotics:  Cefepime 12/14/13  Vancomycin 12/13/13  HPI/Subjective: Patient cannot provide significant history due to mental status  Objective: Filed Vitals:   12/14/13 1415  BP: 106/90  Pulse: 77  Temp: 98.5 F (36.9 C)  Resp: 20    Intake/Output Summary (Last 24 hours) at 12/14/13 1757 Last data filed at 12/14/13 1732  Gross per 24 hour  Intake   1310 ml  Output   2300 ml  Net   -990 ml   Filed Weights   12/13/13 0212  Weight: 68.176 kg (150 lb 4.8 oz)    Exam:   General:  NAD  Cardiovascular: S1, S2 RRR  Respiratory: crackles at bases  Abdomen: soft, nt, nd bs+  Musculoskeletal: no edema b/l   Data Reviewed: Basic Metabolic Panel:  Recent Labs Lab 12/12/13 2340 12/13/13 0253 12/14/13 0516  NA 132* 133* 144  K 3.8 3.8 3.6*  CL 97 97 110  CO2 22 24 23   GLUCOSE 346* 315* 60*  BUN 64* 62* 46*  CREATININE 2.70* 2.68* 2.44*  CALCIUM 8.0* 8.0* 8.5   Liver Function Tests:  Recent Labs Lab 12/13/13 0253  AST 20  ALT 26  ALKPHOS 52  BILITOT 0.5  PROT 6.4  ALBUMIN 2.1*   No results found for this basename: LIPASE, AMYLASE,  in the last 168 hours No results found for this basename: AMMONIA,  in the last 168 hours CBC:  Recent Labs Lab 12/12/13 2340 12/13/13 0253 12/14/13 0516  WBC 17.0* 14.5* 13.0*  NEUTROABS 13.6*  --   --   HGB 9.3* 8.6* 8.7*  HCT 25.9* 24.2* 24.9*  MCV 85.2 86.1 86.5  PLT 241 200 237   Cardiac Enzymes: No results found for this basename: CKTOTAL, CKMB, CKMBINDEX, TROPONINI,  in the last 168 hours BNP (last 3 results)  Recent Labs  12/12/13 2340  PROBNP 6530.0*   CBG:  Recent Labs Lab  12/13/13 2108 12/14/13 0729 12/14/13 0842 12/14/13 1124 12/14/13 1641  GLUCAP 212* 63* 76 84 103*    Recent Results (from the past 240 hour(s))  CULTURE, BLOOD (ROUTINE X 2)     Status: None   Collection Time    12/13/13  2:46 AM      Result Value Ref Range Status   Specimen Description BLOOD LEFT HAND   Final   Special Requests BOTTLES DRAWN AEROBIC AND ANAEROBIC 6CC   Final   Culture NO GROWTH 1 DAY   Final   Report Status PENDING   Incomplete  CULTURE, BLOOD (ROUTINE X 2)      Status: None   Collection Time    12/13/13  2:52 AM      Result Value Ref Range Status   Specimen Description BLOOD RIGHT HAND   Final   Special Requests BOTTLES DRAWN AEROBIC AND ANAEROBIC 6CC   Final   Culture NO GROWTH 1 DAY   Final   Report Status PENDING   Incomplete  MRSA PCR SCREENING     Status: Abnormal   Collection Time    12/13/13  3:13 AM      Result Value Ref Range Status   MRSA by PCR POSITIVE (*) NEGATIVE Final   Comment:            The GeneXpert MRSA Assay (FDA     approved for NASAL specimens     only), is one component of a     comprehensive MRSA colonization     surveillance program. It is not     intended to diagnose MRSA     infection nor to guide or     monitor treatment for     MRSA infections.     RESULT CALLED TO, READ BACK BY AND VERIFIED WITH:     WATKINS,T. AT 1012 ON 12/13/2013 BY BAUGHAM,M.     Studies: Dg Chest 1 View  12/12/2013   CLINICAL DATA:  Short of breath.  Decreased oxygen saturation.  EXAM: CHEST - 1 VIEW  COMPARISON:  02/1913  FINDINGS: There is airspace consolidation in the mid lungs, more dense on the left than the right. This could reflect asymmetric edema or bilateral pneumonia.  No pleural effusion or pneumothorax the cardiac silhouette is normal in size. The aorta is uncoiled. No mediastinal or hilar masses.  IMPRESSION: Bilateral airspace lung opacities. Findings may reflect multifocal pneumonia or asymmetric edema.   Electronically Signed   By: Amie Portland M.D.   On: 12/12/2013 21:13   Dg Chest Port 1 View  12/14/2013   CLINICAL DATA:  Hypoxia.  EXAM: PORTABLE CHEST - 1 VIEW  COMPARISON:  12/12/2013.  FINDINGS: Bilateral perihilar infiltrates may represent infectious infiltrate versus pulmonary edema. Clinical correlation recommended. Findings have changed minimally since prior exam.  No gross pneumothorax.  Heart size top-normal.  Calcified aorta.  IMPRESSION: Bilateral perihilar infiltrates may represent infectious infiltrate  versus pulmonary edema. Clinical correlation recommended. Findings have changed minimally since prior exam.   Electronically Signed   By: Bridgett Larsson M.D.   On: 12/14/2013 03:29    Scheduled Meds: . amLODipine  5 mg Oral Daily  . atorvastatin  20 mg Oral Daily  . ceFEPime (MAXIPIME) IV  1 g Intravenous Q24H  . Chlorhexidine Gluconate Cloth  6 each Topical Q0600  . [START ON 12/15/2013] colchicine  0.6 mg Oral QODAY  . insulin aspart  0-5 Units Subcutaneous QHS  . insulin aspart  0-9 Units Subcutaneous  TID WC  . insulin glargine  25 Units Subcutaneous QHS  . ipratropium-albuterol  3 mL Nebulization Q6H  . mupirocin ointment  1 application Nasal BID  . sertraline  50 mg Oral Daily  . sodium chloride  3 mL Intravenous Q12H  . vancomycin  750 mg Intravenous Q24H   Continuous Infusions:   Principal Problem:   HCAP (healthcare-associated pneumonia) Active Problems:   Acute ischemic left middle cerebral artery (MCA) stroke   Cerebral parenchymal hemorrhage, post tpa   Essential hypertension, benign   PFO (patent foramen ovale)   DM type 2 (diabetes mellitus, type 2)   Acute on chronic renal failure   Acute respiratory failure with hypoxia    Time spent: 35mins    Wayne Wicklund  [facility] Pager [phone]. If 7PM-7AM, please contact night-coverage at [email or web address], password [email or web address] 12/14/2013, 5:57 PM  LOS: 2 days

## 2013-12-14 NOTE — Progress Notes (Signed)
Discussed patients blood sugar with Dr. Kerry HoughMemon and that patient spit medications out this morning. Will continue to monitor. Venti mask off at this time. O2 at 6 liters by [place]. Will Attempt to titrate down. 100% at this time.

## 2013-12-15 LAB — GLUCOSE, CAPILLARY
GLUCOSE-CAPILLARY: 80 mg/dL (ref 70–99)
Glucose-Capillary: 79 mg/dL (ref 70–99)
Glucose-Capillary: 83 mg/dL (ref 70–99)
Glucose-Capillary: 246 mg/dL — ABNORMAL HIGH (ref 70–99)
Glucose-Capillary: 213 mg/dL — ABNORMAL HIGH (ref 70–99)

## 2013-12-15 LAB — BASIC METABOLIC PANEL
BUN: 43 mg/dL — ABNORMAL HIGH (ref 6–23)
CALCIUM: 8.8 mg/dL (ref 8.4–10.5)
CO2: 27 meq/L (ref 19–32)
CREATININE: 2.52 mg/dL — AB (ref 0.50–1.35)
Chloride: 107 mEq/L (ref 96–112)
GFR calc Af Amer: 27 mL/min — ABNORMAL LOW (ref >90)
GFR calc non Af Amer: 24 mL/min — ABNORMAL LOW (ref >90)
Glucose, Bld: 71 mg/dL (ref 70–99)
Potassium: 3.9 mEq/L (ref 3.7–5.3)
SODIUM: 144 meq/L (ref 137–147)

## 2013-12-15 LAB — CBC
HEMATOCRIT: 25.8 % — AB (ref 39.0–52.0)
Hemoglobin: 8.8 g/dL — ABNORMAL LOW (ref 13.0–17.0)
MCH: 29.8 pg (ref 26.0–34.0)
MCHC: 34.1 g/dL (ref 30.0–36.0)
MCV: 87.5 fL (ref 78.0–100.0)
Platelets: 276 10*3/uL (ref 150–400)
RBC: 2.95 MIL/uL — ABNORMAL LOW (ref 4.22–5.81)
RDW: 13.8 % (ref 11.5–15.5)
WBC: 10.3 10*3/uL (ref 4.0–10.5)

## 2013-12-15 LAB — LEGIONELLA ANTIGEN, URINE: LEGIONELLA ANTIGEN, URINE: NEGATIVE

## 2013-12-15 MED ORDER — CEFUROXIME AXETIL 250 MG PO TABS
500.0000 mg | ORAL_TABLET | Freq: Two times a day (BID) | ORAL | Status: DC
  Administered 2013-12-16: 500 mg via ORAL
  Filled 2013-12-15: qty 2

## 2013-12-15 NOTE — Progress Notes (Signed)
PROGRESS NOTE  Brady Frazier RUE:[id number] DOB: June 21, 1941 DOA: 12/12/2013 PCP: Terald Sleeper, MD  Summary: 73 year old man with history of stroke, status post intracerebral hemorrhage secondary to tPA, resides in skilled nursing facility brought to the emergency department for pneumonia. Developed volume overload with IVF.  Assessment/Plan: 1. Acute hypoxic respiratory failure secondary to pneumonia. Improving, no fever, leukocytosis has resolved. 2. Healthcare associated pneumonia versus aspiration. Improving with resolution of leukocytosis. No fever. Continue empiric antibiotics. 3. History of stroke 02/2013, complicated by intracerebral hemorrhage status post TPA. Right hemiparesis. Nonverbal at baseline. Chair \\wheelchair -bound. Per wife the patient is mentally at baseline. 4. Diabetes mellitus type 2. Capillary blood sugars stable. 5. Acute on chronic renal failure. Acute component appears nearly resolved. Appears to be very close to baseline.   Overall appears to be improving. Plan to transition to oral antibiotics today and monitor overnight. Wean oxygen as tolerated.  Anticipate transfer to skilled nursing facility next 48 hours, possibly tomorrow.  Discussed with wife at bedside in detail.  Pending studies:   Blood cultures  No sputum culture obtained  Code Status: DNR DVT prophylaxis: SCDs Family Communication:  Disposition Plan:   Brendia Sacks, MD  [facility]  Pager [phone] If 7PM-7AM, please contact night-coverage at [email or web address], password [facility] 12/15/2013, 1:17 PM  LOS: 3 days   Consultants:  Speech therapy: Dysphagia 3 diet, thin liquids  Procedures:    Antibiotics:  Cefepime 3/2 >>  Vancomycin 3/2 >>  HPI/Subjective: Weaned off venimask to [place]. He has no complaints but is mostly nonverbal. Wife reports he very well. He is mentally at baseline. She is pleased with his progress. No concerns.  Objective: Filed Vitals:   12/15/13  0136 12/15/13 0521 12/15/13 0656 12/15/13 1030  BP:  131/75  118/64  Pulse:  73    Temp:  97.4 F (36.3 C)    TempSrc:  Axillary    Resp:  20    Height:      Weight:      SpO2: 92% 100% 92%     Intake/Output Summary (Last 24 hours) at 12/15/13 1317 Last data filed at 12/15/13 0800  Gross per 24 hour  Intake    510 ml  Output   2400 ml  Net  -1890 ml     Filed Weights   12/13/13 0212  Weight: 68.176 kg (150 lb 4.8 oz)    Exam:   Afebrile, vital signs are stable. 92% 3 L.  Gen. Appears calm and comfortable.  Cardio vascular regular rate and rhythm. No murmur, or gallop. No lower extremity edema.  Respiratory clear to auscultation bilaterally. No wheezes, rales or rhonchi. Normal respiratory effort.  Abdomen soft nontender nondistended.  Skin appears to be grossly unremarkable.  Right-sided contracture upper extremity.  Data Reviewed:  -1.5 L since admission.  Capillary blood sugars stable   creatinine appears to be at baseline 2.54, has stage III-4 disease.  Chronic normocytic anemia, appears to be stable. Leukocytosis has resolved.  Scheduled Meds: . amLODipine  5 mg Oral Daily  . atorvastatin  20 mg Oral Daily  . ceFEPime (MAXIPIME) IV  1 g Intravenous Q24H  . Chlorhexidine Gluconate Cloth  6 each Topical Q0600  . colchicine  0.6 mg Oral QODAY  . insulin aspart  0-5 Units Subcutaneous QHS  . insulin aspart  0-9 Units Subcutaneous TID WC  . insulin glargine  10 Units Subcutaneous QHS  . ipratropium-albuterol  3 mL Nebulization Q6H  . mupirocin ointment  1 application Nasal BID  .  sertraline  50 mg Oral Daily  . sodium chloride  3 mL Intravenous Q12H  . vancomycin  750 mg Intravenous Q24H   Continuous Infusions:   Principal Problem:   HCAP (healthcare-associated pneumonia) Active Problems:   Acute ischemic left middle cerebral artery (MCA) stroke   Cerebral parenchymal hemorrhage, post tpa   Essential hypertension, benign   PFO (patent foramen  ovale)   DM type 2 (diabetes mellitus, type 2)   Acute on chronic renal failure   Acute respiratory failure with hypoxia   Time spent 25 minutes

## 2013-12-15 NOTE — Clinical Social Work Note (Signed)
Updated PNC on pt. Possible d/c tomorrow per MD. No FL2 needed at d/c per Tami at facility.  Brady FennelKara Cliffton Spradley, [place] [phone]

## 2013-12-16 ENCOUNTER — Non-Acute Institutional Stay (SKILLED_NURSING_FACILITY): Payer: Medicare HMO | Admitting: Internal Medicine

## 2013-12-16 ENCOUNTER — Inpatient Hospital Stay
Admission: RE | Admit: 2013-12-16 | Discharge: 2014-05-10 | Disposition: A | Discharge status: Other Acute Inpatient Hospital | Payer: Medicare HMO | Source: Ambulatory Visit | Attending: Internal Medicine | Admitting: Internal Medicine

## 2013-12-16 DIAGNOSIS — R059 Cough, unspecified: Principal | ICD-10-CM

## 2013-12-16 DIAGNOSIS — R05 Cough: Principal | ICD-10-CM

## 2013-12-16 DIAGNOSIS — E119 Type 2 diabetes mellitus without complications: Secondary | ICD-10-CM

## 2013-12-16 DIAGNOSIS — M79609 Pain in unspecified limb: Secondary | ICD-10-CM

## 2013-12-16 DIAGNOSIS — N179 Acute kidney failure, unspecified: Secondary | ICD-10-CM

## 2013-12-16 DIAGNOSIS — I63512 Cerebral infarction due to unspecified occlusion or stenosis of left middle cerebral artery: Secondary | ICD-10-CM

## 2013-12-16 DIAGNOSIS — R531 Weakness: Secondary | ICD-10-CM

## 2013-12-16 DIAGNOSIS — I6350 Cerebral infarction due to unspecified occlusion or stenosis of unspecified cerebral artery: Secondary | ICD-10-CM

## 2013-12-16 DIAGNOSIS — J189 Pneumonia, unspecified organism: Secondary | ICD-10-CM

## 2013-12-16 DIAGNOSIS — M79641 Pain in right hand: Secondary | ICD-10-CM

## 2013-12-16 DIAGNOSIS — N189 Chronic kidney disease, unspecified: Secondary | ICD-10-CM

## 2013-12-16 DIAGNOSIS — E785 Hyperlipidemia, unspecified: Secondary | ICD-10-CM

## 2013-12-16 LAB — BASIC METABOLIC PANEL
BUN: 40 mg/dL — ABNORMAL HIGH (ref 6–23)
CHLORIDE: 109 meq/L (ref 96–112)
CO2: 27 meq/L (ref 19–32)
Calcium: 8.7 mg/dL (ref 8.4–10.5)
Creatinine, Ser: 2.4 mg/dL — ABNORMAL HIGH (ref 0.50–1.35)
GFR calc Af Amer: 29 mL/min — ABNORMAL LOW (ref >90)
GFR calc non Af Amer: 25 mL/min — ABNORMAL LOW (ref >90)
Glucose, Bld: 66 mg/dL — ABNORMAL LOW (ref 70–99)
Potassium: 4.2 mEq/L (ref 3.7–5.3)
SODIUM: 145 meq/L (ref 137–147)

## 2013-12-16 LAB — CBC
HEMATOCRIT: 24.8 % — AB (ref 39.0–52.0)
Hemoglobin: 8.6 g/dL — ABNORMAL LOW (ref 13.0–17.0)
MCH: 30.3 pg (ref 26.0–34.0)
MCHC: 34.7 g/dL (ref 30.0–36.0)
MCV: 87.3 fL (ref 78.0–100.0)
Platelets: 255 10*3/uL (ref 150–400)
RBC: 2.84 MIL/uL — ABNORMAL LOW (ref 4.22–5.81)
RDW: 13.7 % (ref 11.5–15.5)
WBC: 8.6 10*3/uL (ref 4.0–10.5)

## 2013-12-16 LAB — GLUCOSE, CAPILLARY
GLUCOSE-CAPILLARY: 65 mg/dL — AB (ref 70–99)
Glucose-Capillary: 101 mg/dL — ABNORMAL HIGH (ref 70–99)
Glucose-Capillary: 162 mg/dL — ABNORMAL HIGH (ref 70–99)

## 2013-12-16 MED ORDER — CEFUROXIME AXETIL 250 MG PO TABS: 500.0000 mg | ORAL_TABLET | Freq: Every day | ORAL | Status: DC

## 2013-12-16 MED ORDER — IPRATROPIUM-ALBUTEROL 0.5-2.5 (3) MG/3ML IN SOLN
3.0000 mL | Freq: Three times a day (TID) | RESPIRATORY_TRACT | Status: DC
  Administered 2013-12-16: 3 mL via RESPIRATORY_TRACT
  Filled 2013-12-16 (×2): qty 3

## 2013-12-16 MED ORDER — INSULIN GLARGINE 100 UNIT/ML ~~LOC~~ SOLN: 5.0000 [IU] | Freq: Every day | SUBCUTANEOUS | Status: AC

## 2013-12-16 MED ORDER — CEFUROXIME AXETIL 500 MG PO TABS
500.0000 mg | ORAL_TABLET | Freq: Every day | ORAL | Status: DC
Start: 2013-12-17 — End: 2014-03-13

## 2013-12-16 NOTE — Progress Notes (Signed)
Oxygen weaned to 1.5 liters by [place]. O2 sat 99%. Will continue to monitor and wean.

## 2013-12-16 NOTE — Clinical Social Work Note (Signed)
[facility] oriented to self only. Facility notified. CSW attempted to contact pt's wife, but number was disconnected. Pt to transfer with RN. D/C summary faxed.  Derenda FennelKara Jax [place], [place] [phone]

## 2013-12-16 NOTE — Progress Notes (Signed)
UR chart review completed.  

## 2013-12-16 NOTE — Progress Notes (Signed)
Inpatient Diabetes Program Recommendations  AACE/ADA: New Consensus Statement on Inpatient Glycemic Control (2013)  Target Ranges:  Prepandial:   less than 140 mg/dL      Peak postprandial:   less than 180 mg/dL (1-2 hours)      Critically ill patients:  140 - 180 mg/dL   Results for Brady Frazier, Brady Frazier (MRN [id number]) as of 12/16/2013 07:21  Ref. Range 12/15/2013 11:03 12/15/2013 16:24 12/15/2013 20:40 12/16/2013 07:13  Glucose-Capillary Latest Range: 70-99 mg/dL 83 409246 (H) 811213 (H) 65 (L)   Diabetes history: DM2  Outpatient Diabetes medications: Lantus 25 units QHS  Current orders for Inpatient glycemic control: Lantus 10 units QHS, Novolog 0-9 units AC, Novolog 0-5 units HS  Inpatient Diabetes Program Recommendations Insulin - Basal: Please consider decreasing Lantus to 5 units QHS. Correction (SSI): Noted glucose of 246 mg/dl on 3/4 at 91:4716:24 and Novolog correction was not given with reason "pt refusing to eat dinner".  NURSING: Please give Novolog correction even if patient does not eat as Novolog correction is intended to bring glucose down into target glycemic goals.  Thanks, Orlando PennerMarie Sargun Rummell, RN, MSN, CCRN Diabetes Coordinator Inpatient Diabetes Program [phone] (Team Pager) [phone] (AP office) [phone] [facility] office)

## 2013-12-16 NOTE — Discharge Summary (Signed)
Physician Discharge Summary  Brady Frazier WUJ:[id number] DOB: [birth year] DOA: 12/12/2013  PCP: Terald SleeperOBSON,MICHAEL GAVIN, MD  Admit date: 12/12/2013 Discharge date: 12/16/2013  Recommendations for Outpatient Follow-up:  1. Followup resolution of healthcare associated pneumonia 2. Followup resolution of acute renal failure superimposed on chronic kidney disease. Consider repeat basic metabolic panel in one week. 3. Hydrochlorothiazide not resumed given suspicion for gout being treated as an outpatient. 4. Lantus lower 5 units, and likely titrate back up as diet improves. 5. Blood cultures no growth to date at time of discharge  Follow-up Information   Follow up with Terald SleeperOBSON,MICHAEL GAVIN, MD In 1 week.   Specialty:  Internal Medicine   Contact information:   [address] [place] [address] [phone]      Discharge Diagnoses:  1. Acute hypoxic respiratory failure 2. Healthcare associated pneumonia 3. Acute on chronic renal failure 4. 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 He was admitted for acute hypoxic respiratory failure secondary to healthcare associated pneumonia.   Hospital Course:  Pneumonia rapidly improved with empiric treatment with cefepime and vancomycin with resolution of hypoxia, leukocytosis and tachypnea. Currently stable on room air. Evaluated by speech therapy with diet recommendations being made. Given his rapid improvement, without anaerobic coverage, aspiration is doubted. Plan to complete outpatient course of cephalosporins. Other issues as below.  1. Acute hypoxic respiratory failure  secondary to pneumonia. Resolved with treatment of pneumonia. No leukocytosis or fever. Normal respiratory rate. 2. Healthcare associated pneumonia. Bilateral, doubt aspiration, improved without anaerobic coverage. Continue oral antibiotics. 3. 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 Right hemiparesis. Nonverbal at baseline. Chair \\wheelchair -bound. Per wife the patient is mentally at baseline. 4. Diabetes mellitus type 2. One episode of hypoglycemia. Likely to improve his diet improves. Insulin adjusted. 5. Acute on chronic renal failure. Acute component nearly resolved. Very close to baseline. Dehydrated on admission. Hydrochlorothiazide discontinued. 6. Chronic right hand pain. Gout considered. Treatment per outpatient physician. Continue oral antibiotics, cephalosporin (treated with cefepime as an inpatient).  Decrease Lantus to 5 units daily, titrate back to home dose as that improves.  Will not restart hydrochlorothiazide given possible gout being treated as an outpatient.  Although BNP was elevated on admission there was no clinical evidence to suggest heart failure. X-ray findings and history correlated with impression of pneumonia.  Consultants:  Speech therapy: Dysphagia 3 diet, thin liquids Procedures: none Antibiotics:  Cefepime 3/2 >> 3/4  Vancomycin 3/2 >> 3/4  ceftin 3/5 >> 3/11  Discharge Instructions     Medication List    STOP taking these medications       hydrochlorothiazide 25 MG tablet  Commonly known as:  HYDRODIURIL      TAKE these medications       acetaminophen 325 MG tablet  Commonly known as:  TYLENOL  Take 650 mg by mouth every 4 (four) hours as needed. Pain/fever     amLODipine 5 MG tablet  Commonly known as:  NORVASC  Take 5 mg by mouth daily.     atorvastatin 20 MG tablet  Commonly known as:  LIPITOR  Take 20 mg by mouth daily.  cefUROXime 500 MG tablet  Commonly known as:  CEFTIN    Take 1 tablet (500 mg total) by mouth daily. Last dose 3/11.  Start taking on:  12/17/2013     colchicine 0.6 MG tablet  Take 0.6 mg by mouth 2 (two) times daily.     DUONEB 0.5-2.5 (3) MG/3ML Soln  Generic drug:  ipratropium-albuterol  Take 3 mLs by nebulization every 6 (six) hours.     insulin glargine 100 UNIT/ML injection  Commonly known as:  LANTUS  Inject 0.05 mLs (5 Units total) into the skin at bedtime.     sertraline 50 MG tablet  Commonly known as:  ZOLOFT  Take 1 tablet (50 mg total) by mouth daily.     traMADol 50 MG tablet  Commonly known as:  ULTRAM  Take 50 mg by mouth 3 (three) times daily as needed. pain       No Known Allergies  The results of significant diagnostics from this hospitalization (including imaging, microbiology, ancillary and laboratory) are listed below for reference.    Significant Diagnostic Studies: Dg Chest 1 View  12/12/2013   CLINICAL DATA:  Short of breath.  Decreased oxygen saturation.  EXAM: CHEST - 1 VIEW  COMPARISON:  02/1913  FINDINGS: There is airspace consolidation in the mid lungs, more dense on the left than the right. This could reflect asymmetric edema or bilateral pneumonia.  No pleural effusion or pneumothorax the cardiac silhouette is normal in size. The aorta is uncoiled. No mediastinal or hilar masses.  IMPRESSION: Bilateral airspace lung opacities. Findings may reflect multifocal pneumonia or asymmetric edema.   Electronically Signed   By: Amie Portland M.D.   On: 12/12/2013 21:13   Dg Chest Port 1 View  12/14/2013   CLINICAL DATA:  Hypoxia.  EXAM: PORTABLE CHEST - 1 VIEW  COMPARISON:  12/12/2013.  FINDINGS: Bilateral perihilar infiltrates may represent infectious infiltrate versus pulmonary edema. Clinical correlation recommended. Findings have changed minimally since prior exam.  No gross pneumothorax.  Heart size top-normal.  Calcified aorta.  IMPRESSION: Bilateral perihilar infiltrates may represent infectious infiltrate  versus pulmonary edema. Clinical correlation recommended. Findings have changed minimally since prior exam.   Electronically Signed   By: Bridgett Larsson M.D.   On: 12/14/2013 03:29    Microbiology: Recent Results (from the past 240 hour(s))  CULTURE, BLOOD (ROUTINE X 2)     Status: None   Collection Time    12/13/13  2:46 AM      Result Value Ref Range Status   Specimen Description BLOOD LEFT HAND   Final   Special Requests BOTTLES DRAWN AEROBIC AND ANAEROBIC 6CC   Final   Culture NO GROWTH 3 DAYS   Final   Report Status PENDING   Incomplete  CULTURE, BLOOD (ROUTINE X 2)     Status: None   Collection Time    12/13/13  2:52 AM      Result Value Ref Range Status   Specimen Description BLOOD RIGHT HAND   Final   Special Requests BOTTLES DRAWN AEROBIC AND ANAEROBIC 6CC   Final   Culture NO GROWTH 3 DAYS   Final   Report Status PENDING   Incomplete  MRSA PCR SCREENING     Status: Abnormal   Collection Time    12/13/13  3:13 AM      Result Value Ref Range Status   MRSA by PCR POSITIVE (*) NEGATIVE Final   Comment:  The GeneXpert MRSA Assay (FDA     approved for NASAL specimens     only), is one component of a     comprehensive MRSA colonization     surveillance program. It is not     intended to diagnose MRSA     infection nor to guide or     monitor treatment for     MRSA infections.     RESULT CALLED TO, READ BACK BY AND VERIFIED WITH:     WATKINS,T. AT 1012 ON 12/13/2013 BY BAUGHAM,M.     Labs: Basic Metabolic Panel:  Recent Labs Lab 12/12/13 2340 12/13/13 0253 12/14/13 0516 12/15/13 0451 12/16/13 0545  NA 132* 133* 144 144 145  K 3.8 3.8 3.6* 3.9 4.2  CL 97 97 110 107 109  CO2 22 24 23 27 27   GLUCOSE 346* 315* 60* 71 66*  BUN 64* 62* 46* 43* 40*  CREATININE 2.70* 2.68* 2.44* 2.52* 2.40*  CALCIUM 8.0* 8.0* 8.5 8.8 8.7   Liver Function Tests:  Recent Labs Lab 12/13/13 0253  AST 20  ALT 26  ALKPHOS 52  BILITOT 0.5  PROT 6.4  ALBUMIN 2.1*    CBC:  Recent Labs Lab 12/12/13 2340 12/13/13 0253 12/14/13 0516 12/15/13 0451 12/16/13 0545  WBC 17.0* 14.5* 13.0* 10.3 8.6  NEUTROABS 13.6*  --   --   --   --   HGB 9.3* 8.6* 8.7* 8.8* 8.6*  HCT 25.9* 24.2* 24.9* 25.8* 24.8*  MCV 85.2 86.1 86.5 87.5 87.3  PLT 241 200 237 276 255     Recent Labs  12/12/13 2340  PROBNP 6530.0*   CBG:  Recent Labs Lab 12/15/13 1624 12/15/13 2040 12/16/13 0713 12/16/13 0837 12/16/13 1107  GLUCAP 246* 213* 65* 101* 162*    Principal Problem:   HCAP (healthcare-associated pneumonia) Active Problems:   Acute ischemic left middle cerebral artery (MCA) stroke   Cerebral parenchymal hemorrhage, post tpa   Essential hypertension, benign   PFO (patent foramen ovale)   DM type 2 (diabetes mellitus, type 2)   Acute on chronic renal failure   Acute respiratory failure with hypoxia   Time coordinating discharge: 35 minutes  Signed:  Brendia Sacks, MD [facility] 12/16/2013, 12:09 PM

## 2013-12-16 NOTE — Progress Notes (Signed)
Report called to [name] at [facility]. Patient ready for transport.

## 2013-12-16 NOTE — Progress Notes (Signed)
PROGRESS NOTE  Brady Frazier ZOX:[id number] DOB: 26-Feb-1941 DOA: 12/12/2013 PCP: Terald Sleeper, MD  Summary: 73 year old man with history of stroke, status post intracerebral hemorrhage secondary to tPA, resides in skilled nursing facility brought to the emergency department for pneumonia. He was admitted for acute hypoxic respiratory failure secondary to healthcare associated pneumonia.   Assessment/Plan: 1. Acute hypoxic respiratory failure secondary to pneumonia. Resolved with treatment of pneumonia. No leukocytosis or fever. Normal respiratory rate. 2. Healthcare associated pneumonia. Bilateral, doubt aspiration, improved without anaerobic coverage. Continue oral antibiotics. 3. 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 Right hemiparesis. Nonverbal at baseline. Chair \\wheelchair -bound. Per wife the patient is mentally at baseline. 4. Diabetes mellitus type 2. One episode of hypoglycemia. Adjust insulin. 5. Acute on chronic renal failure. Acute component nearly resolved. Very close to baseline. Dehydrated on admission. 6. Chronic right hand pain. Doubt consider. Treatment per outpatient physician.   Continue oral antibiotics, cephalosporin (treated with cefepime as an inpatient).  Decrease Lantus to 5 units daily, titrate back to home dose as that improves.   Will not restart hydrochlorothiazide given possible gout being treated as an outpatient.  Transfer back to skilled nursing facility today. No cancer when attempted to call wife, we did discuss his discharge yesterday today and she was in agreement.   Pending studies:   Blood cultures no growth to date  No sputum culture obtained on admission  Brendia Sacks, MD  [facility]  Pager [phone] If 7PM-7AM, please contact night-coverage at [email or web address], password [facility] 12/16/2013, 10:57 AM  LOS: 4 days   Consultants:  Speech therapy: Dysphagia 3 diet, thin  liquids  Procedures:    Antibiotics:  Cefepime 3/2 >> 3/4 Vancomycin 3/2 >> 3/4 ceftin 3/5 >> 3/11  HPI/Subjective: Successfully weaned off oxygen. No issues overnight per nursing. Patient unable to offer history secondary to baseline patient.   Objective: Filed Vitals:   12/15/13 2242 12/16/13 0445 12/16/13 0840 12/16/13 1030  BP: 121/59 127/66 112/55   Pulse: 77 71 69 74  Temp: 97.1 F (36.2 C) 99.2 F (37.3 C)    TempSrc: Axillary Axillary    Resp: 18 20    Height:      Weight:      SpO2: 100% 100% 99% 100%    Intake/Output Summary (Last 24 hours) at 12/16/13 1057 Last data filed at 12/16/13 0800  Gross per 24 hour  Intake    480 ml  Output   1200 ml  Net   -720 ml     Filed Weights   12/13/13 0212  Weight: 68.176 kg (150 lb 4.8 oz)    Exam:   Afebrile, vital signs are stable.   Gen. He appears calm and comfortable. Weight. He is aphasic.  Cardiovascular regular rate and rhythm. No murmur, rub or gallop. No lower extremity edema.  Telemetry sinus rhythm.  Respiratory clear to auscultation bilaterally with no wheezes rales or rhonchi. Normal respiratory effort.  Abdomen soft nontender nondistended.  Musculoskeletal. Moves both legs spontaneously. Contracture of the right hand noted without significant swelling. Gen. pain with movement of the hand which has been a chronic issue. Strong radial pulse. Perfusion appears intact. Left hand appears unremarkable and is nontender to manipulation.  Psychiatric cannot assess.  Skin appears unremarkable without rash or induration.  Data Reviewed:  -2.2 L since admission.  One episode of mild hypoglycemia this morning.fasting blood sugar 66.  Baseline creatinine appears to be 2.2-2.3. Creatinine trending down from admission 2.7 >>  2.4. BUN trending down from admission 64 >> 40. Potassium is normal.  Hemoglobin stable at 8.6. Anemia of chronic disease suspected.   Blood cultures no growth to  date  Scheduled Meds: . amLODipine  5 mg Oral Daily  . atorvastatin  20 mg Oral Daily  . cefUROXime  500 mg Oral BID WC  . Chlorhexidine Gluconate Cloth  6 each Topical Q0600  . colchicine  0.6 mg Oral QODAY  . insulin aspart  0-5 Units Subcutaneous QHS  . insulin aspart  0-9 Units Subcutaneous TID WC  . insulin glargine  10 Units Subcutaneous QHS  . ipratropium-albuterol  3 mL Nebulization TID  . mupirocin ointment  1 application Nasal BID  . sertraline  50 mg Oral Daily  . sodium chloride  3 mL Intravenous Q12H   Continuous Infusions:   Principal Problem:   HCAP (healthcare-associated pneumonia) Active Problems:   Acute ischemic left middle cerebral artery (MCA) stroke   Cerebral parenchymal hemorrhage, post tpa   Essential hypertension, benign   PFO (patent foramen ovale)   DM type 2 (diabetes mellitus, type 2)   Acute on chronic renal failure   Acute respiratory failure with hypoxia

## 2013-12-16 NOTE — Progress Notes (Signed)
Patient ID: Brady Frazier, male   DOB: 09-15-1941, 73 y.o.   MRN: [id number]   This is an acute visit.  Level of care skilled.  Facility [facility].  Chief complaint-acute visit status post hospitalization for pneumonia.  History of present illness.  Patient is a 73 year old male with a history of CVA status post intracerebral hemorrhage- who was admitted to the hospital from this facility on March 1 secondary to hypoxia  This was diagnosed as secondary to pneumonia-there was bilateral infiltrates doubtful aspiration improved on antibiotics he continues on Ceftin.  Patient does have a history of embolic stroke in May of 2014 complicated with an intracerebral hemorrhage status post TPA-he continues with right-sided hemiparesis and is essentially nonverbal.  Is also a type II diabetic with occasionally low blood sugars apparently this occurred once in the hospital and his Lantus was decreased he is now on 5 units.  Also a history of acute on chronic renal failure however on discharge his creatinine e had returned to baseline with a creatinine of 2.4 and a BUN of 40  Patient also has a history of quite significant right hand pain this was thought possibly gout and his hydrochlorothiazide was discontinued in the hospital-he is also on colchicine this will have to be monitored.  Patient's BNP was also elevated on admission to the hospital but there was no clinical evidence to suggest an acute CHF episode-x-ray findings strongly suggested pneumonia as well as his clinical presentation.  Currently patient appears to be back at his baseline he is afebrile vital signs are stable 93% oxygen saturation.  Family medical social history hasbeen reviewed    Past Medical History   Diagnosis  Date   .  Diabetes mellitus    .  Hypertension    .  Stroke    .  Kidney disease     Past Surgical History   Procedure  Laterality  Date   .  Stomach surgery     .  Tee without cardioversion  N/A  02/25/2013       Procedure: TRANSESOPHAGEAL ECHOCARDIOGRAM (TEE); Surgeon: Lewayne Bunting, MD; Location: [facility] ENDOSCOPY; Service: Cardiovascular; Laterality: N/A; Rosann Auerbach Fawn Kirk     Medications have been reviewed.  Tylenol 325 mg-take 650 mg every 4 hours when necessary pain or fever.  Norvasc 5 mg daily.  Lipitor 20 mg daily.  Ceftin 500 mg twice a day until March 11.  Colchicine 0.6 mg twice a day. Duonebs every 6 hours.  Lantus 5 units each bedtime.  Zoloft 50 mg daily.  Tramadol 50 mg 3 times a day  Hospital studies.  12/12/2013-chest x-ray-bilateral airspace lung opacities reflecting multifocal pneumonia or asymmetric edema.  12/13/2013.  Blood culture no growth determined after 3 days.  Family medical social history quite limited not available from any current sore-- he has been a resident of this facility for some time i--does have a supportive family-wife who visits often.  Review of systems -- essentially unobtainable from patient per nursing-no shortness of breath or congestion noted no complaints of chest pain nursing feels he is essentially at his baseline where he was previous to hospitalization--he does continue to complain at times of right hand pain especially with movement or palpation of the area.  Physical exam.  Temperature is 97.9 pulse 76 respirations 20 blood pressure 129/74 O2 saturation 93% weight is 144-CBG since returning to facility is 99  In general this is a frail elderly male in no distress lying in bed.  His skin is  warm and dry he does have a very small open area upper sacral area there is no firmness or drainage or surrounding erythema or sign of acute infection here.  Eyes pupils appear reactive to light sclerae and conjunctivae clear.  Oropharynx from what I can tell is clear although he did not open his mouth very wide.  Chest is clear to auscultation with reduced breath sounds no labored breathing.  Heart is regular rate and rhythm without murmur  gallop or rub he does not have significant edema.  Abdomen soft nontender with positive bowel sounds.  Muscle skeletal he has right-sided hemiparesis is not really able to move his right arm he does have some edema of his right hand is tender to palpation radial pulse is intact he has some general lower extremity stiffness.--  Neurologic he does have again right upper extremity paralysis with some edema of his hand--  Psych-he does not really speak significantly appears to have severe dementia.  Labs.  12/16/2013.  Sodium 145 potassium 4.2 BUN 40 creatinine 2.4.  The BBC 8.6 hemoglobin 8.6 platelets 255.  Or second 2015.  Liver function tests within normal limits except albumin of 2.1.  Assessment and plan.  #1-pneumonia with history of acute hypoxic respiratory failure-patient appears to be back at his baseline he will be on antibiotic until March 11-also has nebulizers as vital signs O2 sats is satisfactory he appears essentially back at his baseline.  #2 history of CVA-continues with right-sided hemiparesis this has been quite significant but appears to be at his baseline.  Will need extensive supportive care.  #3 diabetes type 2-apparently does have low blood sugars at times in the hospital-- Lantus was decreased-CBG this p.m. is 99 we'll continue to monitor this--  \ #4-history of hyperlipidemia he continues on Lipitor with his cardiovascular issues I suspect statin has been continued.  #5-history of right hand pain-this is thought possibly to be gout or pseudogout he is on a course of colchicine at some point may consider prednisone he had been on this at one point-he is not a good candidate for NSAID secondary to his renal issues.--Is also on tramadol 3 times a day when necessary   6-history of chronic renal insufficiency-this appears to be back at baseline Will recheck this first laboratory date next week.  #7-anemia-most likely chronic disease this will have to be  updated as well early next week.  #8-hypertension-we'll continue to monitor so far minimal readings he is on Norvasc  #9-depression-he continues on Zoloft  [id number]-of note 35 minutes spent assessing patient-reviewing his hospital records-and coordinating and formulating a plan of care for numerous diagnoses-of note greater than 50% of time spent coordinating plan of care    .

## 2013-12-17 ENCOUNTER — Other Ambulatory Visit: Payer: Self-pay | Admitting: *Deleted

## 2013-12-17 LAB — GLUCOSE, CAPILLARY
GLUCOSE-CAPILLARY: 143 mg/dL — AB (ref 70–99)
Glucose-Capillary: 188 mg/dL — ABNORMAL HIGH (ref 70–99)

## 2013-12-17 MED ORDER — TRAMADOL HCL 50 MG PO TABS: ORAL_TABLET | ORAL | Status: DC

## 2013-12-17 NOTE — Telephone Encounter (Signed)
[facility] 

## 2013-12-18 ENCOUNTER — Encounter: Payer: Self-pay | Admitting: Internal Medicine

## 2013-12-18 LAB — CULTURE, BLOOD (ROUTINE X 2)
CULTURE: NO GROWTH
Culture: NO GROWTH

## 2013-12-18 LAB — GLUCOSE, CAPILLARY
GLUCOSE-CAPILLARY: 296 mg/dL — AB (ref 70–99)
Glucose-Capillary: 139 mg/dL — ABNORMAL HIGH (ref 70–99)
Glucose-Capillary: 225 mg/dL — ABNORMAL HIGH (ref 70–99)
Glucose-Capillary: 336 mg/dL — ABNORMAL HIGH (ref 70–99)

## 2013-12-19 LAB — GLUCOSE, CAPILLARY
GLUCOSE-CAPILLARY: 245 mg/dL — AB (ref 70–99)
Glucose-Capillary: 105 mg/dL — ABNORMAL HIGH (ref 70–99)
Glucose-Capillary: 178 mg/dL — ABNORMAL HIGH (ref 70–99)
Glucose-Capillary: 287 mg/dL — ABNORMAL HIGH (ref 70–99)

## 2013-12-20 ENCOUNTER — Non-Acute Institutional Stay (SKILLED_NURSING_FACILITY): Payer: Medicare HMO | Admitting: Internal Medicine

## 2013-12-20 DIAGNOSIS — N189 Chronic kidney disease, unspecified: Secondary | ICD-10-CM

## 2013-12-20 DIAGNOSIS — J189 Pneumonia, unspecified organism: Secondary | ICD-10-CM

## 2013-12-20 DIAGNOSIS — N179 Acute kidney failure, unspecified: Secondary | ICD-10-CM

## 2013-12-20 DIAGNOSIS — E1165 Type 2 diabetes mellitus with hyperglycemia: Secondary | ICD-10-CM

## 2013-12-20 DIAGNOSIS — E1129 Type 2 diabetes mellitus with other diabetic kidney complication: Secondary | ICD-10-CM

## 2013-12-20 LAB — GLUCOSE, CAPILLARY
GLUCOSE-CAPILLARY: 199 mg/dL — AB (ref 70–99)
GLUCOSE-CAPILLARY: 254 mg/dL — AB (ref 70–99)
Glucose-Capillary: 121 mg/dL — ABNORMAL HIGH (ref 70–99)

## 2013-12-20 NOTE — Progress Notes (Signed)
Patient ID: Brady Frazier, male   DOB: [birth year], 73 y.o.   MRN: [id number] Facility: [facility] Chief complaint: Readmission to [facility] post admit to [place] History: From what I can determine it would appear that this man has been in the building since May of 2014. 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 He apparently received TPA At an outside hospital and suffered an intraparenchymal hemorrhage. He came to this facility after a stay at [facility] inpatient rehabilitation. He has been cared for since that time in this building by Dr. Ouida Frazier and recently transferred to our Frazier  The patient was admitted to hospital with fever and pneumonia on cxr. He is completing his antibiotics  EXAM: PORTABLE CHEST - 1 VIEW   COMPARISON:  12/12/2013.   FINDINGS: Bilateral perihilar infiltrates may represent infectious infiltrate versus pulmonary edema. Clinical correlation recommended. Findings have changed minimally since prior exam.   No gross pneumothorax.   Heart size top-normal.   Calcified aorta.   IMPRESSION: Bilateral perihilar infiltrates may represent infectious infiltrate versus pulmonary edema. Clinical correlation recommended. Findings have changed minimally since prior exam.     Electronically Signed   By: Brady Frazier M.D.   On: 12/14/2013 03:29 Results for Brady Frazier (MRN [id number]) as of 12/20/2013 22:46  Ref. Range 12/13/2013 03:13 12/13/2013 12:42 12/14/2013 05:16 12/15/2013 04:51 12/16/2013 05:45  Sodium Latest Range: 137-147 mEq/L   144 144 145  Potassium Latest Range: 3.7-5.3 mEq/L   3.6 (L) 3.9 4.2  Chloride Latest Range: 96-112 mEq/L   110 107 109  CO2 Latest Range: 19-32 mEq/L   23 27 27   BUN Latest Range: 6-23 mg/dL   46 (H) 43 (H) 40 (H)  Creatinine Latest Range: 0.50-1.35 mg/dL   7.82 (H) 9.56 (H) 2.13 (H)  Calcium Latest Range: 8.4-10.5 mg/dL   8.5 8.8 8.7  GFR calc non Af Amer Latest Range: >90 mL/min   25 (L) 24 (L) 25  (L)  GFR calc Af Amer Latest Range: >90 mL/min   28 (L) 27 (L) 29 (L)  Glucose Latest Range: 70-99 mg/dL   60 (L) 71 66 (L)  WBC Latest Range: 4.0-10.5 K/uL   13.0 (H) 10.3 8.6  RBC Latest Range: 4.22-5.81 MIL/uL   2.88 (L) 2.95 (L) 2.84 (L)  Hemoglobin Latest Range: 13.0-17.0 g/dL   8.7 (L) 8.8 (L) 8.6 (L)  HCT Latest Range: 39.0-52.0 %   24.9 (L) 25.8 (L) 24.8 (L)  MCV Latest Range: 78.0-100.0 fL   86.5 87.5 87.3  MCH Latest Range: 26.0-34.0 pg   30.2 29.8 30.3  MCHC Latest Range: 30.0-36.0 g/dL   08.6 57.8 46.9  RDW Latest Range: 11.5-15.5 %   13.5 13.8 13.7  Platelets Latest Range: 150-400 K/uL   237 276 255  Legionella Antigen, Urine No range found  Negative for Legi...     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 Diabetes mellitus   . Hypertension   . Stroke   . Kidney disease    Past Surgical History  Procedure Laterality Date  . Stomach surgery    .  Tee without cardioversion N/A 02/25/2013    Procedure: TRANSESOPHAGEAL ECHOCARDIOGRAM (TEE);  Surgeon: Lewayne BuntingBrian S Crenshaw, MD;  [facility];  Service: Cardiovascular;  Laterality: N/A;  Rosann Auerbachtrish Fawn Kirk/ja    Social history; there is virtually no information on this man. There is no advanced directive  reports that he has never smoked. He does not have any smokeless tobacco history on file. He reports that he does not drink alcohol or use illicit drugs.  Review of systems; not possible from the patient. We have been treating the patient for pain in his right MCP's PIP's and to a lesser extent his wrist.   Physical examination; Gen.; this is a very  neurologically disabled man Respiratory; clear entry bilaterally Cardiac; heart sounds are normal. There is no signs of heart failure and I detected no murmur there were no carotid bruits Musculoskeletal; there appears to be less pain in the rt hand and wrist.  Neurological; this is a very disabled man. He does not appear to be able to use the right arm, minimal use of the left arm. Both his legs appear to be contractured with markedly increased tone. He is not appear to be able to communicate although at one point he appeared to mumble his first name  Impression/plan #1pneumonia: appears to have been treated. He is afebrile and respiratory status is stable #2 late effect the dominant hemisphere stroke complicated by a post TPA intracerebral hemorrhage. This is less this man very disabled. I do not have much information on this man from prior to May of 2014. 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 He has had an x-ray that is negative for however I think this is most likely to be pseudogout or possibly gout. He seems to be better on the colchicine. Did not respond well to steroids.  He is not a candidate for NSAIDs given his renal insufficiency. 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 We will recheck this #5 chronic renal failure; appears to be back to his baseline   I am going to rechallenge this man with prednisone they're not a large number of options here. I will update his lab work including his BUN creatinine hemoglobin A1c.        *[place]*                *[facility]*                      [address]                     [place], [place] [address]                         [phone]   ------------------------------------------------------------ Transesophageal Echocardiography  Patient:    Brady Frazier, Brady Frazier MR #:       [id number] Study Date: 02/25/2013 Gender:     M Age:        [age] Height:     172.7cm Weight:      83.6kg BSA:        2.5731m^2 Pt. Status: Room:       4N05C    PERFORMING   Crenshaw, Lincoln MaxinBrian  ATTENDING    Knapp, Harland DingwallIva L  ADMITTING    Sethi, Pramod  SONOGRAPHER  Perley Jainich Lombardo, RDCS  ORDERING     Barrett, Virgel Manifoldhonda  REFERRING    Barrett, Rhonda cc:  ------------------------------------------------------------  LV EF: 55% -   60%  ------------------------------------------------------------ Indications:      CVA 436.  ------------------------------------------------------------ Study Conclusions  - Left ventricle: Hypertrophy was noted. Systolic function   was normal. The estimated ejection fraction was in the   range of 55% to 60%. Wall motion was normal; there were no   regional wall motion abnormalities. - Mitral valve: No evidence of vegetation. - Left atrium: No evidence of thrombus in the atrial cavity   or appendage. - Atrial septum: There was a patent foramen ovale. There was   an atrial septal aneurysm. - Tricuspid valve: No evidence of vegetation. Transesophageal echocardiography.  2D and color Doppler. Height:  Height: 172.7cm. Height: 68in.  Weight:  Weight: 83.6kg. Weight: 184lb.  Body mass index:  BMI: 28kg/m^2. Body surface area:    BSA: 2.81m^2.  Blood pressure: 165/88.  Patient status:  Inpatient.  Location:  Endoscopy.   ------------------------------------------------------------  ------------------------------------------------------------ Left ventricle:  Hypertrophy was noted. Systolic function was normal. The estimated ejection fraction was in the range of 55% to 60%. Wall motion was normal; there were no regional wall motion abnormalities.  ------------------------------------------------------------ Aortic valve:   Trileaflet.  Doppler:   No regurgitation.   ------------------------------------------------------------ Aorta:  Aortic root: The aortic root was mildly dilated. Descending aorta: Mild to moderate  atherosclerosis.  ------------------------------------------------------------ Mitral valve:   Structurally normal valve.   Leaflet separation was normal.  No evidence of vegetation.  Doppler:   Trivial regurgitation.  ------------------------------------------------------------ Left atrium:  The atrium was normal in size.  No evidence of thrombus in the atrial cavity or appendage.  ------------------------------------------------------------ Atrial septum:  There was a patent foramen ovale by color Doppler. There was an atrial septal aneurysm.  ------------------------------------------------------------ Right ventricle:  The cavity size was normal. Systolic function was normal.  ------------------------------------------------------------ Pulmonic valve:    The valve appears to be grossly normal.   ------------------------------------------------------------ Tricuspid valve:   Structurally normal valve.   Leaflet separation was normal.  No evidence of vegetation.  Doppler:   Trivial regurgitation.  ------------------------------------------------------------ Right atrium:  The atrium was normal in size. 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  ------------------------------------------------------------ Pericardium:  There was no pericardial effusion.   ------------------------------------------------------------ Prepared and Electronically Authenticated by  Olga Millers 2014-05-15T16:20:08.670     Wall Scoring       Show images for MR Brain Wo Contrast     Study Result    *RADIOLOGY REPORT*   Clinical Data:  Post t-PA hemorrhage.  Left brain stroke.   MRI HEAD WITHOUT CONTRAST MRA HEAD WITHOUT CONTRAST   Technique:  Multiplanar, multiecho pulse sequences of the brain and surrounding structures were obtained without intravenous contrast. Angiographic images of the head were obtained using MRA technique without contrast.   Comparison:  CT head  02/20/2013 through 02/22/13.  MRI brain 07/04/2010.   MRI HEAD   Findings:  Scattered areas of left MCA territory posterior frontal, parietal, cortical and subcortical acute infarction are separate from the area of hemorrhage. Their multiplicity suggests multiple emboli.  There is a large (68 x 52 x 42 mm, approx 70 ml) acute left parieto-occipital intracerebral hematoma with slight intraventricular extension. There is peripheral restricted diffusion.  There is mild vasogenic edema surrounding hematoma which extends to the surface of the brain.  There is compression of the left lateral ventricle.  No midline shift is evident. There is no visible intracranial mass lesion.   Compared with the prior MR from 2011, no underlying vascular malformation in  this region was identified.   Remote infarctions are seen in the left superior cerebellum, right mid cerebellum, and left occipital lobe.  Foci of chronic hemorrhage can be seen associated with right greater than left posterior temporal lobe infarcts as seen on gradient sequence image 14 series 8.  Moderate ventriculomegaly similar to priors, without evidence for obstructive hydrocephalus.   Atrophy and chronic microvascular ischemic change.  No osseous lesions.  Cervical spondylosis incompletely evaluated but appears fairly pronounced at C3-C4.  No tonsillar herniation. Mild chronic sinus disease.   IMPRESSION: 68 x 52 x 42 mm, approximate 70 ml intracerebral hemorrhage, with intraventricular extension. Multiple areas of left hemisphere restricted diffusion consistent with acute non hemorrhagic embolic infarction.  Findings consistent with post t-PA hemorrhage without visible underlying vascular malformation.   Areas of atrophy, small vessel disease, and chronic infarction elsewhere as described.   MRA HEAD   Findings: Internal carotid arteries widely patent.  The basilar artery is widely patent with vertebrals codominant.   There is no proximal intracranial stenosis or aneurysm.  No vascular malformation is evident.  Mild irregularity distal MCA and PCA branches suggests intracranial atherosclerotic change.   IMPRESSION: No proximal stenosis or vascular malformation.  No intracranial aneurysm is evident.     Original Report Authenticated By: Davonna Belling, M.D.

## 2013-12-21 ENCOUNTER — Encounter: Payer: Self-pay | Admitting: Internal Medicine

## 2013-12-21 ENCOUNTER — Other Ambulatory Visit: Payer: Self-pay | Admitting: *Deleted

## 2013-12-21 ENCOUNTER — Non-Acute Institutional Stay (SKILLED_NURSING_FACILITY): Payer: Medicare HMO | Admitting: Internal Medicine

## 2013-12-21 DIAGNOSIS — M79641 Pain in right hand: Secondary | ICD-10-CM

## 2013-12-21 DIAGNOSIS — M79609 Pain in unspecified limb: Secondary | ICD-10-CM

## 2013-12-21 DIAGNOSIS — E119 Type 2 diabetes mellitus without complications: Secondary | ICD-10-CM

## 2013-12-21 LAB — GLUCOSE, CAPILLARY
GLUCOSE-CAPILLARY: 168 mg/dL — AB (ref 70–99)
Glucose-Capillary: 209 mg/dL — ABNORMAL HIGH (ref 70–99)
Glucose-Capillary: 243 mg/dL — ABNORMAL HIGH (ref 70–99)
Glucose-Capillary: 281 mg/dL — ABNORMAL HIGH (ref 70–99)
Glucose-Capillary: 208 mg/dL — ABNORMAL HIGH (ref 70–99)

## 2013-12-21 MED ORDER — TRAMADOL HCL 50 MG PO TABS: ORAL_TABLET | ORAL | Status: DC

## 2013-12-21 NOTE — Telephone Encounter (Signed)
[facility] 

## 2013-12-21 NOTE — Progress Notes (Signed)
Patient ID: Brady Frazier, male   DOB: 10-Aug-1941, 73 y.o.   MRN: [id number]   This is an acute visit.  Level of care skilled.  [facility]  Chief complaint-acute visit followup diabetes-right hand pain.  History of present illness.  Patient is an elderly resident with a history of CVA with left-sided hemiparesis this was complicated by a post TPA intracerebral hemorrhage -he continues with significant disability.  Social had some fairly intense arthritis of his right hand and wrist x-ray was negative for an acute process this was thought likely to be pseudogout or possibly gout he has been placed on colchicine and this appears to be helping although he still has breakthrough pain at times appears to be a lot more comfortable this morning his nurse did give him a when necessary Ultram and this appears to have a beneficial impact.  Apparently he does not receive this very often although it appears from what I see this morning he does get significant benefit.  He also is a type II diabetic on Lantus 5 units a day.  His morning sugars appear to be in the mid 100s however later in the day appear to get somewhat elevated in the 200 I even see one 300-this is more prevalent in the later day periods no sugars are 260-2 25-178-199.  4 PM recent sugars have been 336-245 and 254.  At bedtime sugars recently have been [phone]  Family medical social history as been reviewed her previous progress note on 12/20/2013.  Medications have been reviewed per MAR.  Review of systems essentially unobtainable secondary to patient's aphasic status.  Physical exam.  Temperature is 98.1 pulse 88 respirations 20 blood pressure 136/68 O2 saturation is 97% on room air.  In general this is a frail elderly male in no distress sitting comfortably in his wheelchair.  His skin is warm and dry.  Heart is regular rate and rhythm without murmur gallop or rub.  Chest is clear to auscultation with poor  respiratory effort  Musculoskeletal.  -appears to have less edema and pain of his right wrist than what I saw last week even with flexion at the wrist he is not grimacing like he did last week  Labs.  12/20/2013.  WBC 9.2 hemoglobin 9.3 platelets 3:30.  Sodium 145 potassium 4.6 BUN 40 creatinine 2.09.  Assessment and plan.  #1-right wrist pain-this appears to be improved on colchicine and it appears the Ultram was quite beneficial this morning-nursing is asked to make this routine to make sure he gets it-Will write order to make tramadol 50 mg routine every morning and continue the when necessary doses.  #2-diabetes type 2-morning sugars appear to be okay although appears to be more elevated recently in later day periods will write order for 2 units of NovoLog at noon and supper time if blood sugar is greater than 150--and patient eats greater than 50%  [id number]

## 2013-12-22 LAB — GLUCOSE, CAPILLARY
GLUCOSE-CAPILLARY: 152 mg/dL — AB (ref 70–99)
GLUCOSE-CAPILLARY: 178 mg/dL — AB (ref 70–99)
GLUCOSE-CAPILLARY: 121 mg/dL — AB (ref 70–99)
Glucose-Capillary: 129 mg/dL — ABNORMAL HIGH (ref 70–99)

## 2013-12-23 LAB — GLUCOSE, CAPILLARY
GLUCOSE-CAPILLARY: 151 mg/dL — AB (ref 70–99)
GLUCOSE-CAPILLARY: 132 mg/dL — AB (ref 70–99)
Glucose-Capillary: 188 mg/dL — ABNORMAL HIGH (ref 70–99)
Glucose-Capillary: 187 mg/dL — ABNORMAL HIGH (ref 70–99)

## 2013-12-24 LAB — GLUCOSE, CAPILLARY
GLUCOSE-CAPILLARY: 288 mg/dL — AB (ref 70–99)
GLUCOSE-CAPILLARY: 132 mg/dL — AB (ref 70–99)
Glucose-Capillary: 84 mg/dL (ref 70–99)
Glucose-Capillary: 131 mg/dL — ABNORMAL HIGH (ref 70–99)

## 2013-12-25 LAB — GLUCOSE, CAPILLARY
GLUCOSE-CAPILLARY: 111 mg/dL — AB (ref 70–99)
GLUCOSE-CAPILLARY: 145 mg/dL — AB (ref 70–99)
Glucose-Capillary: 109 mg/dL — ABNORMAL HIGH (ref 70–99)

## 2013-12-26 LAB — GLUCOSE, CAPILLARY
Glucose-Capillary: 107 mg/dL — ABNORMAL HIGH (ref 70–99)
Glucose-Capillary: 74 mg/dL (ref 70–99)
Glucose-Capillary: 206 mg/dL — ABNORMAL HIGH (ref 70–99)
Glucose-Capillary: 90 mg/dL (ref 70–99)

## 2013-12-27 LAB — GLUCOSE, CAPILLARY
GLUCOSE-CAPILLARY: 53 mg/dL — AB (ref 70–99)
GLUCOSE-CAPILLARY: 91 mg/dL (ref 70–99)
GLUCOSE-CAPILLARY: 178 mg/dL — AB (ref 70–99)
GLUCOSE-CAPILLARY: 172 mg/dL — AB (ref 70–99)
Glucose-Capillary: 279 mg/dL — ABNORMAL HIGH (ref 70–99)
Glucose-Capillary: 136 mg/dL — ABNORMAL HIGH (ref 70–99)

## 2013-12-28 ENCOUNTER — Encounter: Payer: Self-pay | Admitting: *Deleted

## 2013-12-28 LAB — GLUCOSE, CAPILLARY
GLUCOSE-CAPILLARY: 90 mg/dL (ref 70–99)
Glucose-Capillary: 179 mg/dL — ABNORMAL HIGH (ref 70–99)
Glucose-Capillary: 183 mg/dL — ABNORMAL HIGH (ref 70–99)
Glucose-Capillary: 117 mg/dL — ABNORMAL HIGH (ref 70–99)

## 2013-12-29 LAB — GLUCOSE, CAPILLARY
GLUCOSE-CAPILLARY: 215 mg/dL — AB (ref 70–99)
Glucose-Capillary: 172 mg/dL — ABNORMAL HIGH (ref 70–99)
Glucose-Capillary: 175 mg/dL — ABNORMAL HIGH (ref 70–99)
Glucose-Capillary: 218 mg/dL — ABNORMAL HIGH (ref 70–99)

## 2013-12-30 LAB — GLUCOSE, CAPILLARY
Glucose-Capillary: 100 mg/dL — ABNORMAL HIGH (ref 70–99)
Glucose-Capillary: 105 mg/dL — ABNORMAL HIGH (ref 70–99)
Glucose-Capillary: 196 mg/dL — ABNORMAL HIGH (ref 70–99)
Glucose-Capillary: 218 mg/dL — ABNORMAL HIGH (ref 70–99)

## 2013-12-31 LAB — GLUCOSE, CAPILLARY
GLUCOSE-CAPILLARY: 143 mg/dL — AB (ref 70–99)
GLUCOSE-CAPILLARY: 204 mg/dL — AB (ref 70–99)
GLUCOSE-CAPILLARY: 188 mg/dL — AB (ref 70–99)
Glucose-Capillary: 116 mg/dL — ABNORMAL HIGH (ref 70–99)

## 2014-01-01 LAB — GLUCOSE, CAPILLARY
GLUCOSE-CAPILLARY: 121 mg/dL — AB (ref 70–99)
Glucose-Capillary: 85 mg/dL (ref 70–99)
Glucose-Capillary: 96 mg/dL (ref 70–99)
Glucose-Capillary: 221 mg/dL — ABNORMAL HIGH (ref 70–99)
Glucose-Capillary: 313 mg/dL — ABNORMAL HIGH (ref 70–99)

## 2014-01-02 LAB — GLUCOSE, CAPILLARY
GLUCOSE-CAPILLARY: 84 mg/dL (ref 70–99)
Glucose-Capillary: 166 mg/dL — ABNORMAL HIGH (ref 70–99)
Glucose-Capillary: 219 mg/dL — ABNORMAL HIGH (ref 70–99)
Glucose-Capillary: 154 mg/dL — ABNORMAL HIGH (ref 70–99)

## 2014-01-03 LAB — GLUCOSE, CAPILLARY
Glucose-Capillary: 81 mg/dL (ref 70–99)
Glucose-Capillary: 134 mg/dL — ABNORMAL HIGH (ref 70–99)
Glucose-Capillary: 141 mg/dL — ABNORMAL HIGH (ref 70–99)
Glucose-Capillary: 119 mg/dL — ABNORMAL HIGH (ref 70–99)

## 2014-01-04 LAB — GLUCOSE, CAPILLARY
GLUCOSE-CAPILLARY: 219 mg/dL — AB (ref 70–99)
Glucose-Capillary: 80 mg/dL (ref 70–99)
Glucose-Capillary: 113 mg/dL — ABNORMAL HIGH (ref 70–99)
Glucose-Capillary: 120 mg/dL — ABNORMAL HIGH (ref 70–99)

## 2014-01-05 LAB — GLUCOSE, CAPILLARY
GLUCOSE-CAPILLARY: 76 mg/dL (ref 70–99)
Glucose-Capillary: 158 mg/dL — ABNORMAL HIGH (ref 70–99)
Glucose-Capillary: 154 mg/dL — ABNORMAL HIGH (ref 70–99)
Glucose-Capillary: 199 mg/dL — ABNORMAL HIGH (ref 70–99)

## 2014-01-06 LAB — GLUCOSE, CAPILLARY
GLUCOSE-CAPILLARY: 184 mg/dL — AB (ref 70–99)
Glucose-Capillary: 88 mg/dL (ref 70–99)
Glucose-Capillary: 113 mg/dL — ABNORMAL HIGH (ref 70–99)
Glucose-Capillary: 144 mg/dL — ABNORMAL HIGH (ref 70–99)

## 2014-01-07 LAB — GLUCOSE, CAPILLARY
GLUCOSE-CAPILLARY: 82 mg/dL (ref 70–99)
GLUCOSE-CAPILLARY: 96 mg/dL (ref 70–99)
Glucose-Capillary: 93 mg/dL (ref 70–99)
Glucose-Capillary: 242 mg/dL — ABNORMAL HIGH (ref 70–99)
Glucose-Capillary: 97 mg/dL (ref 70–99)

## 2014-01-08 LAB — GLUCOSE, CAPILLARY
GLUCOSE-CAPILLARY: 124 mg/dL — AB (ref 70–99)
GLUCOSE-CAPILLARY: 180 mg/dL — AB (ref 70–99)
Glucose-Capillary: 104 mg/dL — ABNORMAL HIGH (ref 70–99)

## 2014-01-09 LAB — GLUCOSE, CAPILLARY
GLUCOSE-CAPILLARY: 82 mg/dL (ref 70–99)
Glucose-Capillary: 157 mg/dL — ABNORMAL HIGH (ref 70–99)
Glucose-Capillary: 117 mg/dL — ABNORMAL HIGH (ref 70–99)
Glucose-Capillary: 178 mg/dL — ABNORMAL HIGH (ref 70–99)

## 2014-01-10 LAB — GLUCOSE, CAPILLARY
GLUCOSE-CAPILLARY: 124 mg/dL — AB (ref 70–99)
Glucose-Capillary: 81 mg/dL (ref 70–99)
Glucose-Capillary: 140 mg/dL — ABNORMAL HIGH (ref 70–99)

## 2014-01-11 LAB — GLUCOSE, CAPILLARY
GLUCOSE-CAPILLARY: 109 mg/dL — AB (ref 70–99)
GLUCOSE-CAPILLARY: 84 mg/dL (ref 70–99)
GLUCOSE-CAPILLARY: 169 mg/dL — AB (ref 70–99)
Glucose-Capillary: 150 mg/dL — ABNORMAL HIGH (ref 70–99)

## 2014-01-12 LAB — GLUCOSE, CAPILLARY
GLUCOSE-CAPILLARY: 131 mg/dL — AB (ref 70–99)
GLUCOSE-CAPILLARY: 62 mg/dL — AB (ref 70–99)
Glucose-Capillary: 206 mg/dL — ABNORMAL HIGH (ref 70–99)
Glucose-Capillary: 64 mg/dL — ABNORMAL LOW (ref 70–99)

## 2014-01-13 LAB — GLUCOSE, CAPILLARY
GLUCOSE-CAPILLARY: 188 mg/dL — AB (ref 70–99)
GLUCOSE-CAPILLARY: 228 mg/dL — AB (ref 70–99)
Glucose-Capillary: 79 mg/dL (ref 70–99)
Glucose-Capillary: 137 mg/dL — ABNORMAL HIGH (ref 70–99)
Glucose-Capillary: 134 mg/dL — ABNORMAL HIGH (ref 70–99)
Glucose-Capillary: 71 mg/dL (ref 70–99)
Glucose-Capillary: 89 mg/dL (ref 70–99)
Glucose-Capillary: 183 mg/dL — ABNORMAL HIGH (ref 70–99)

## 2014-01-14 LAB — GLUCOSE, CAPILLARY
GLUCOSE-CAPILLARY: 230 mg/dL — AB (ref 70–99)
Glucose-Capillary: 89 mg/dL (ref 70–99)
Glucose-Capillary: 183 mg/dL — ABNORMAL HIGH (ref 70–99)
Glucose-Capillary: 156 mg/dL — ABNORMAL HIGH (ref 70–99)

## 2014-01-15 LAB — GLUCOSE, CAPILLARY
GLUCOSE-CAPILLARY: 142 mg/dL — AB (ref 70–99)
GLUCOSE-CAPILLARY: 245 mg/dL — AB (ref 70–99)
Glucose-Capillary: 109 mg/dL — ABNORMAL HIGH (ref 70–99)
Glucose-Capillary: 98 mg/dL (ref 70–99)

## 2014-01-16 LAB — GLUCOSE, CAPILLARY
GLUCOSE-CAPILLARY: 130 mg/dL — AB (ref 70–99)
GLUCOSE-CAPILLARY: 203 mg/dL — AB (ref 70–99)
Glucose-Capillary: 83 mg/dL (ref 70–99)
Glucose-Capillary: 156 mg/dL — ABNORMAL HIGH (ref 70–99)

## 2014-01-17 LAB — GLUCOSE, CAPILLARY
GLUCOSE-CAPILLARY: 161 mg/dL — AB (ref 70–99)
Glucose-Capillary: 101 mg/dL — ABNORMAL HIGH (ref 70–99)
Glucose-Capillary: 97 mg/dL (ref 70–99)
Glucose-Capillary: 105 mg/dL — ABNORMAL HIGH (ref 70–99)

## 2014-01-18 LAB — GLUCOSE, CAPILLARY
GLUCOSE-CAPILLARY: 128 mg/dL — AB (ref 70–99)
GLUCOSE-CAPILLARY: 152 mg/dL — AB (ref 70–99)
Glucose-Capillary: 153 mg/dL — ABNORMAL HIGH (ref 70–99)
Glucose-Capillary: 84 mg/dL (ref 70–99)
Glucose-Capillary: 138 mg/dL — ABNORMAL HIGH (ref 70–99)

## 2014-01-19 LAB — GLUCOSE, CAPILLARY
Glucose-Capillary: 95 mg/dL (ref 70–99)
Glucose-Capillary: 135 mg/dL — ABNORMAL HIGH (ref 70–99)

## 2014-01-20 LAB — GLUCOSE, CAPILLARY
GLUCOSE-CAPILLARY: 136 mg/dL — AB (ref 70–99)
Glucose-Capillary: 156 mg/dL — ABNORMAL HIGH (ref 70–99)
Glucose-Capillary: 100 mg/dL — ABNORMAL HIGH (ref 70–99)
Glucose-Capillary: 85 mg/dL (ref 70–99)
Glucose-Capillary: 169 mg/dL — ABNORMAL HIGH (ref 70–99)

## 2014-01-21 LAB — GLUCOSE, CAPILLARY
Glucose-Capillary: 150 mg/dL — ABNORMAL HIGH (ref 70–99)
Glucose-Capillary: 99 mg/dL (ref 70–99)
Glucose-Capillary: 117 mg/dL — ABNORMAL HIGH (ref 70–99)
Glucose-Capillary: 223 mg/dL — ABNORMAL HIGH (ref 70–99)
Glucose-Capillary: 131 mg/dL — ABNORMAL HIGH (ref 70–99)

## 2014-01-22 LAB — GLUCOSE, CAPILLARY
GLUCOSE-CAPILLARY: 171 mg/dL — AB (ref 70–99)
Glucose-Capillary: 107 mg/dL — ABNORMAL HIGH (ref 70–99)
Glucose-Capillary: 148 mg/dL — ABNORMAL HIGH (ref 70–99)
Glucose-Capillary: 171 mg/dL — ABNORMAL HIGH (ref 70–99)

## 2014-01-23 LAB — GLUCOSE, CAPILLARY
GLUCOSE-CAPILLARY: 211 mg/dL — AB (ref 70–99)
GLUCOSE-CAPILLARY: 252 mg/dL — AB (ref 70–99)
Glucose-Capillary: 130 mg/dL — ABNORMAL HIGH (ref 70–99)
Glucose-Capillary: 161 mg/dL — ABNORMAL HIGH (ref 70–99)
Glucose-Capillary: 138 mg/dL — ABNORMAL HIGH (ref 70–99)
Glucose-Capillary: 201 mg/dL — ABNORMAL HIGH (ref 70–99)

## 2014-01-24 LAB — GLUCOSE, CAPILLARY
GLUCOSE-CAPILLARY: 100 mg/dL — AB (ref 70–99)
Glucose-Capillary: 79 mg/dL (ref 70–99)
Glucose-Capillary: 107 mg/dL — ABNORMAL HIGH (ref 70–99)
Glucose-Capillary: 160 mg/dL — ABNORMAL HIGH (ref 70–99)

## 2014-01-25 LAB — GLUCOSE, CAPILLARY
Glucose-Capillary: 82 mg/dL (ref 70–99)
Glucose-Capillary: 181 mg/dL — ABNORMAL HIGH (ref 70–99)
Glucose-Capillary: 130 mg/dL — ABNORMAL HIGH (ref 70–99)

## 2014-01-26 LAB — GLUCOSE, CAPILLARY
GLUCOSE-CAPILLARY: 88 mg/dL (ref 70–99)
GLUCOSE-CAPILLARY: 114 mg/dL — AB (ref 70–99)
Glucose-Capillary: 122 mg/dL — ABNORMAL HIGH (ref 70–99)
Glucose-Capillary: 191 mg/dL — ABNORMAL HIGH (ref 70–99)

## 2014-01-27 LAB — GLUCOSE, CAPILLARY
Glucose-Capillary: 167 mg/dL — ABNORMAL HIGH (ref 70–99)
Glucose-Capillary: 82 mg/dL (ref 70–99)

## 2014-01-28 LAB — GLUCOSE, CAPILLARY
GLUCOSE-CAPILLARY: 144 mg/dL — AB (ref 70–99)
GLUCOSE-CAPILLARY: 140 mg/dL — AB (ref 70–99)
GLUCOSE-CAPILLARY: 122 mg/dL — AB (ref 70–99)
Glucose-Capillary: 114 mg/dL — ABNORMAL HIGH (ref 70–99)
Glucose-Capillary: 162 mg/dL — ABNORMAL HIGH (ref 70–99)

## 2014-01-29 LAB — GLUCOSE, CAPILLARY
Glucose-Capillary: 79 mg/dL (ref 70–99)
Glucose-Capillary: 152 mg/dL — ABNORMAL HIGH (ref 70–99)
Glucose-Capillary: 220 mg/dL — ABNORMAL HIGH (ref 70–99)
Glucose-Capillary: 104 mg/dL — ABNORMAL HIGH (ref 70–99)

## 2014-01-30 LAB — GLUCOSE, CAPILLARY
GLUCOSE-CAPILLARY: 211 mg/dL — AB (ref 70–99)
Glucose-Capillary: 145 mg/dL — ABNORMAL HIGH (ref 70–99)
Glucose-Capillary: 82 mg/dL (ref 70–99)
Glucose-Capillary: 118 mg/dL — ABNORMAL HIGH (ref 70–99)

## 2014-01-31 LAB — GLUCOSE, CAPILLARY: Glucose-Capillary: 74 mg/dL (ref 70–99)

## 2014-02-01 LAB — GLUCOSE, CAPILLARY: GLUCOSE-CAPILLARY: 97 mg/dL (ref 70–99)

## 2014-02-02 LAB — GLUCOSE, CAPILLARY
Glucose-Capillary: 169 mg/dL — ABNORMAL HIGH (ref 70–99)
Glucose-Capillary: 97 mg/dL (ref 70–99)
Glucose-Capillary: 106 mg/dL — ABNORMAL HIGH (ref 70–99)
Glucose-Capillary: 167 mg/dL — ABNORMAL HIGH (ref 70–99)
Glucose-Capillary: 212 mg/dL — ABNORMAL HIGH (ref 70–99)

## 2014-02-03 LAB — GLUCOSE, CAPILLARY
GLUCOSE-CAPILLARY: 159 mg/dL — AB (ref 70–99)
GLUCOSE-CAPILLARY: 142 mg/dL — AB (ref 70–99)

## 2014-02-04 LAB — GLUCOSE, CAPILLARY
GLUCOSE-CAPILLARY: 122 mg/dL — AB (ref 70–99)
Glucose-Capillary: 94 mg/dL (ref 70–99)

## 2014-02-05 LAB — GLUCOSE, CAPILLARY
Glucose-Capillary: 70 mg/dL (ref 70–99)
Glucose-Capillary: 232 mg/dL — ABNORMAL HIGH (ref 70–99)

## 2014-02-06 LAB — GLUCOSE, CAPILLARY
Glucose-Capillary: 84 mg/dL (ref 70–99)
Glucose-Capillary: 171 mg/dL — ABNORMAL HIGH (ref 70–99)

## 2014-02-07 LAB — GLUCOSE, CAPILLARY
GLUCOSE-CAPILLARY: 135 mg/dL — AB (ref 70–99)
Glucose-Capillary: 115 mg/dL — ABNORMAL HIGH (ref 70–99)

## 2014-02-08 LAB — GLUCOSE, CAPILLARY
GLUCOSE-CAPILLARY: 90 mg/dL (ref 70–99)
Glucose-Capillary: 104 mg/dL — ABNORMAL HIGH (ref 70–99)

## 2014-02-09 LAB — GLUCOSE, CAPILLARY
Glucose-Capillary: 78 mg/dL (ref 70–99)
Glucose-Capillary: 210 mg/dL — ABNORMAL HIGH (ref 70–99)

## 2014-02-10 LAB — GLUCOSE, CAPILLARY: Glucose-Capillary: 190 mg/dL — ABNORMAL HIGH (ref 70–99)

## 2014-02-11 LAB — GLUCOSE, CAPILLARY
GLUCOSE-CAPILLARY: 112 mg/dL — AB (ref 70–99)
Glucose-Capillary: 123 mg/dL — ABNORMAL HIGH (ref 70–99)

## 2014-02-12 LAB — GLUCOSE, CAPILLARY: Glucose-Capillary: 204 mg/dL — ABNORMAL HIGH (ref 70–99)

## 2014-02-13 LAB — GLUCOSE, CAPILLARY
Glucose-Capillary: 177 mg/dL — ABNORMAL HIGH (ref 70–99)
Glucose-Capillary: 137 mg/dL — ABNORMAL HIGH (ref 70–99)

## 2014-02-14 LAB — GLUCOSE, CAPILLARY
GLUCOSE-CAPILLARY: 109 mg/dL — AB (ref 70–99)
GLUCOSE-CAPILLARY: 121 mg/dL — AB (ref 70–99)
GLUCOSE-CAPILLARY: 144 mg/dL — AB (ref 70–99)
Glucose-Capillary: 116 mg/dL — ABNORMAL HIGH (ref 70–99)

## 2014-02-15 LAB — GLUCOSE, CAPILLARY
GLUCOSE-CAPILLARY: 116 mg/dL — AB (ref 70–99)
Glucose-Capillary: 145 mg/dL — ABNORMAL HIGH (ref 70–99)

## 2014-02-16 LAB — GLUCOSE, CAPILLARY
Glucose-Capillary: 91 mg/dL (ref 70–99)
Glucose-Capillary: 135 mg/dL — ABNORMAL HIGH (ref 70–99)

## 2014-02-17 LAB — GLUCOSE, CAPILLARY
GLUCOSE-CAPILLARY: 101 mg/dL — AB (ref 70–99)
GLUCOSE-CAPILLARY: 232 mg/dL — AB (ref 70–99)

## 2014-02-18 LAB — GLUCOSE, CAPILLARY: GLUCOSE-CAPILLARY: 91 mg/dL (ref 70–99)

## 2014-02-19 LAB — GLUCOSE, CAPILLARY
GLUCOSE-CAPILLARY: 103 mg/dL — AB (ref 70–99)
Glucose-Capillary: 220 mg/dL — ABNORMAL HIGH (ref 70–99)

## 2014-02-20 LAB — GLUCOSE, CAPILLARY
GLUCOSE-CAPILLARY: 73 mg/dL (ref 70–99)
Glucose-Capillary: 130 mg/dL — ABNORMAL HIGH (ref 70–99)
Glucose-Capillary: 109 mg/dL — ABNORMAL HIGH (ref 70–99)

## 2014-02-21 LAB — GLUCOSE, CAPILLARY
Glucose-Capillary: 65 mg/dL — ABNORMAL LOW (ref 70–99)
Glucose-Capillary: 103 mg/dL — ABNORMAL HIGH (ref 70–99)

## 2014-02-22 LAB — GLUCOSE, CAPILLARY: Glucose-Capillary: 97 mg/dL (ref 70–99)

## 2014-02-23 LAB — GLUCOSE, CAPILLARY
Glucose-Capillary: 146 mg/dL — ABNORMAL HIGH (ref 70–99)
Glucose-Capillary: 74 mg/dL (ref 70–99)
Glucose-Capillary: 232 mg/dL — ABNORMAL HIGH (ref 70–99)

## 2014-02-24 LAB — GLUCOSE, CAPILLARY
GLUCOSE-CAPILLARY: 100 mg/dL — AB (ref 70–99)
GLUCOSE-CAPILLARY: 186 mg/dL — AB (ref 70–99)

## 2014-02-25 LAB — GLUCOSE, CAPILLARY
Glucose-Capillary: 111 mg/dL — ABNORMAL HIGH (ref 70–99)
Glucose-Capillary: 120 mg/dL — ABNORMAL HIGH (ref 70–99)

## 2014-02-26 LAB — GLUCOSE, CAPILLARY
GLUCOSE-CAPILLARY: 193 mg/dL — AB (ref 70–99)
Glucose-Capillary: 117 mg/dL — ABNORMAL HIGH (ref 70–99)

## 2014-02-27 LAB — GLUCOSE, CAPILLARY
Glucose-Capillary: 90 mg/dL (ref 70–99)
Glucose-Capillary: 93 mg/dL (ref 70–99)

## 2014-02-28 LAB — GLUCOSE, CAPILLARY
Glucose-Capillary: 87 mg/dL (ref 70–99)
Glucose-Capillary: 159 mg/dL — ABNORMAL HIGH (ref 70–99)

## 2014-03-01 LAB — GLUCOSE, CAPILLARY
GLUCOSE-CAPILLARY: 118 mg/dL — AB (ref 70–99)
GLUCOSE-CAPILLARY: 153 mg/dL — AB (ref 70–99)

## 2014-03-02 LAB — GLUCOSE, CAPILLARY: GLUCOSE-CAPILLARY: 77 mg/dL (ref 70–99)

## 2014-03-03 LAB — GLUCOSE, CAPILLARY
GLUCOSE-CAPILLARY: 163 mg/dL — AB (ref 70–99)
Glucose-Capillary: 138 mg/dL — ABNORMAL HIGH (ref 70–99)

## 2014-03-04 LAB — GLUCOSE, CAPILLARY
GLUCOSE-CAPILLARY: 108 mg/dL — AB (ref 70–99)
Glucose-Capillary: 133 mg/dL — ABNORMAL HIGH (ref 70–99)

## 2014-03-05 LAB — GLUCOSE, CAPILLARY
GLUCOSE-CAPILLARY: 85 mg/dL (ref 70–99)
Glucose-Capillary: 139 mg/dL — ABNORMAL HIGH (ref 70–99)

## 2014-03-06 LAB — GLUCOSE, CAPILLARY
GLUCOSE-CAPILLARY: 83 mg/dL (ref 70–99)
GLUCOSE-CAPILLARY: 141 mg/dL — AB (ref 70–99)

## 2014-03-07 LAB — GLUCOSE, CAPILLARY
Glucose-Capillary: 71 mg/dL (ref 70–99)
Glucose-Capillary: 174 mg/dL — ABNORMAL HIGH (ref 70–99)

## 2014-03-09 LAB — GLUCOSE, CAPILLARY
GLUCOSE-CAPILLARY: 115 mg/dL — AB (ref 70–99)
GLUCOSE-CAPILLARY: 126 mg/dL — AB (ref 70–99)
Glucose-Capillary: 97 mg/dL (ref 70–99)
Glucose-Capillary: 74 mg/dL (ref 70–99)

## 2014-03-10 LAB — GLUCOSE, CAPILLARY
Glucose-Capillary: 118 mg/dL — ABNORMAL HIGH (ref 70–99)
Glucose-Capillary: 138 mg/dL — ABNORMAL HIGH (ref 70–99)

## 2014-03-11 ENCOUNTER — Non-Acute Institutional Stay (SKILLED_NURSING_FACILITY): Payer: Commercial Managed Care - HMO | Admitting: Internal Medicine

## 2014-03-11 DIAGNOSIS — N179 Acute kidney failure, unspecified: Secondary | ICD-10-CM

## 2014-03-11 DIAGNOSIS — I63512 Cerebral infarction due to unspecified occlusion or stenosis of left middle cerebral artery: Secondary | ICD-10-CM

## 2014-03-11 DIAGNOSIS — E119 Type 2 diabetes mellitus without complications: Secondary | ICD-10-CM

## 2014-03-11 DIAGNOSIS — M79641 Pain in right hand: Secondary | ICD-10-CM

## 2014-03-11 DIAGNOSIS — N189 Chronic kidney disease, unspecified: Secondary | ICD-10-CM

## 2014-03-11 DIAGNOSIS — I6350 Cerebral infarction due to unspecified occlusion or stenosis of unspecified cerebral artery: Secondary | ICD-10-CM

## 2014-03-11 DIAGNOSIS — I10 Essential (primary) hypertension: Secondary | ICD-10-CM

## 2014-03-11 DIAGNOSIS — M79609 Pain in unspecified limb: Secondary | ICD-10-CM

## 2014-03-11 LAB — GLUCOSE, CAPILLARY
Glucose-Capillary: 143 mg/dL — ABNORMAL HIGH (ref 70–99)
Glucose-Capillary: 112 mg/dL — ABNORMAL HIGH (ref 70–99)

## 2014-03-11 NOTE — Progress Notes (Signed)
Patient ID: Brady Frazier, male   DOB: 18-Sep-1941, 73 y.o.   MRN: [id number]   This is an acute visit.  Level of care skilled.  [facility]   Chief complaint-medical management of CVA late effect-diabetes type 2-hypertension-question pseudogout-depression-   History of present illness.  Patient is a 73 year old male with a history of CVA status post intracerebral hemorrhage- who was admitted to the hospital from this facility on March 1 secondary to hypoxia  This was diagnosed as secondary to pneumonia-there was bilateral infiltrates doubtful aspiration improved on antibiotics  This appears to have been stable recently .  Patient does have a history of embolic stroke in May of 2014 complicated with an intracerebral hemorrhage status post TPA-he continues with right-sided hemiparesis and is essentially nonverbal.--Although he does speak some and actually did today   Is also a type II diabetic --he is on Lantus 5 units a day this was decreased during his hospitalization secondary to low blood sugars-blood sugars in the morning appeared to be variable ranging from the 70s-to 138.  At at bedtime blood sugars run largely in the lower-mid 100s .  Also a history of acute on chronic renal failure however this appears to be stable with a recent creatinine of 2.04 on March 30 2 BUN of 24. Her  Patient also has a history of quite significant right hand pain this was thought possibly gout and his hydrochlorothiazide was discontinued in the hospital--this has been assessed by our service and he does continue on colchicine twice a day apparently this is getting significant relief he is also on tramadol routinely in the morning as well as when necessary throughout the day  Patient's BNP was also elevated on admission to the hospital but there was no clinical evidence to suggest an acute CHF episode-x-ray findings strongly suggested pneumonia as well as his clinical presentation.--Again this appears to have  been stable for some time   .  Family medical social history hasbeen reviewed  Past Medical History   Diagnosis  Date   .  Diabetes mellitus    .  Hypertension    .  Stroke    .  Kidney disease     Past Surgical History   Procedure  Laterality  Date   .  Stomach surgery     .  Tee without cardioversion  N/A  02/25/2013     Procedure: TRANSESOPHAGEAL ECHOCARDIOGRAM (TEE); Surgeon: Lewayne BuntingBrian S Crenshaw, MD; [facility]; Service: Cardiovascular; Laterality: N/A; Rosann Auerbachtrish Fawn Kirk/ja     Medications have been reviewed.  Tylenol 325 mg-take 650 mg every 4 hours when necessary pain or fever.  Norvasc 5 mg daily.  Lipitor 20 mg daily. .  Colchicine 0.6 mg twice a day.  Duonebs every 6 hours.  Lantus 5 units each bedtime.  Zoloft 50 mg daily.  Tramadol 50 mg  Every morning and every 6 hours when necessary  Hospital studies.  12/12/2013-chest x-ray-bilateral airspace lung opacities reflecting multifocal pneumonia or asymmetric edema.  12/13/2013.  Blood culture no growth determined after 3 days .  Family medical social history quite limited not available from any current source-- he has been a resident of this facility for some time i--does have a supportive family-wife who visits often .  Review of systems -- essentially unobtainable from patient per nursing-no shortness of breath or congestion noted no complaints of chest pain nursing feels he is essentially at his baseline --he does continue to complain at times of right hand pain at times  with significant palpation but this has improved .  Physical exam.   Temperature 97.2 pulse 69 respirations 20 blood pressure 132/64 weight is 144  In general this is a frail elderly male in no distress . Sitting comfortably in his wheelchair His skin is warm and dry   Eyes pupils appear reactive to light sclerae and conjunctivae clear.   Oropharynx from what I can tell is clear although he did not open his mouth very wide .  Chest is clear to  auscultation with reduced breath sounds no labored breathing .  Heart is regular rate and rhythm without murmur gallop or rub he does not have significant edema .  Abdomen soft nontender with positive bowel sounds.   Muscle skeletal he has right-sided hemiparesis-- right hand area there is some tenderness  to palpation but this is improved from previous exams--did not note any acute deformities  radial pulse is intact he has some general lower extremity stiffness and. contractures--  Neurologic he does have again right upper extremity paralysis --  Psych-he does not really speak significantly appears to have severe dementia.   Labs.  01/10/2014.  Sodium 143 potassium 4.4 BUN 24 creatinine 2.04.  12/20/2013.  WBC 9.2 hemoglobin 9.3 platelets 3:30.    12/16/2013.  Sodium 145 potassium 4.2 BUN 40 creatinine 2.4.  WBC 8.6 hemoglobin 8.6 platelets 255  11/24/2013.  Hemoglobin A1c 6.8.  Liver function tests within normal limits except albumin of 2.6 uric acid was 7.6.   Marland Kitchen  Assessment and plan.   #1-pneumonia with history of acute hypoxic respiratory failure-patient appears to be back at his -has nebulizers every 6 hours when necessary baseline--   #2 history of CVA-continues with right-sided hemiparesis this has been quite significant but appears to be at his baseline. --Continue supportive care. He also is on a statin --will update liver function tests #3 diabetes type 2- Continue to monitor he is on low dose Lantus-recent hemoglobin A1c satisfactory we'll update this--   .  #4-history of right hand pain-this is thought possibly to be gout or pseudogout he is on a course of colchicine this appears to be helping as well as the tramadol routinely in the morning and when necessary throughout the day every 6 hours--we'll update uric acid level   5-history of chronic renal insufficiency-this appears to be back at baseline Will recheck this  #7-anemia-most likely chronic disease  this will have to be updated as well e  #8-hypertension-we'll continue to monitor this appears stable he is on Norvasc  #9-depression-he continues on Zoloft   [phone]

## 2014-03-12 LAB — GLUCOSE, CAPILLARY: GLUCOSE-CAPILLARY: 96 mg/dL (ref 70–99)

## 2014-03-13 ENCOUNTER — Encounter: Payer: Self-pay | Admitting: Internal Medicine

## 2014-03-13 LAB — GLUCOSE, CAPILLARY: Glucose-Capillary: 81 mg/dL (ref 70–99)

## 2014-03-14 LAB — GLUCOSE, CAPILLARY
GLUCOSE-CAPILLARY: 167 mg/dL — AB (ref 70–99)
GLUCOSE-CAPILLARY: 97 mg/dL (ref 70–99)
Glucose-Capillary: 111 mg/dL — ABNORMAL HIGH (ref 70–99)

## 2014-03-15 LAB — GLUCOSE, CAPILLARY
Glucose-Capillary: 119 mg/dL — ABNORMAL HIGH (ref 70–99)
Glucose-Capillary: 219 mg/dL — ABNORMAL HIGH (ref 70–99)
Glucose-Capillary: 190 mg/dL — ABNORMAL HIGH (ref 70–99)

## 2014-03-16 LAB — GLUCOSE, CAPILLARY: Glucose-Capillary: 151 mg/dL — ABNORMAL HIGH (ref 70–99)

## 2014-03-17 LAB — GLUCOSE, CAPILLARY: Glucose-Capillary: 186 mg/dL — ABNORMAL HIGH (ref 70–99)

## 2014-03-18 LAB — GLUCOSE, CAPILLARY
GLUCOSE-CAPILLARY: 187 mg/dL — AB (ref 70–99)
GLUCOSE-CAPILLARY: 87 mg/dL (ref 70–99)
Glucose-Capillary: 130 mg/dL — ABNORMAL HIGH (ref 70–99)

## 2014-03-19 LAB — GLUCOSE, CAPILLARY
Glucose-Capillary: 119 mg/dL — ABNORMAL HIGH (ref 70–99)
Glucose-Capillary: 221 mg/dL — ABNORMAL HIGH (ref 70–99)

## 2014-03-20 LAB — GLUCOSE, CAPILLARY
Glucose-Capillary: 87 mg/dL (ref 70–99)
Glucose-Capillary: 233 mg/dL — ABNORMAL HIGH (ref 70–99)

## 2014-03-21 LAB — GLUCOSE, CAPILLARY
GLUCOSE-CAPILLARY: 50 mg/dL — AB (ref 70–99)
GLUCOSE-CAPILLARY: 71 mg/dL (ref 70–99)
GLUCOSE-CAPILLARY: 106 mg/dL — AB (ref 70–99)

## 2014-03-22 LAB — GLUCOSE, CAPILLARY
GLUCOSE-CAPILLARY: 85 mg/dL (ref 70–99)
Glucose-Capillary: 90 mg/dL (ref 70–99)

## 2014-03-23 LAB — GLUCOSE, CAPILLARY
GLUCOSE-CAPILLARY: 82 mg/dL (ref 70–99)
GLUCOSE-CAPILLARY: 208 mg/dL — AB (ref 70–99)

## 2014-03-24 LAB — GLUCOSE, CAPILLARY
GLUCOSE-CAPILLARY: 107 mg/dL — AB (ref 70–99)
GLUCOSE-CAPILLARY: 172 mg/dL — AB (ref 70–99)
Glucose-Capillary: 94 mg/dL (ref 70–99)

## 2014-03-25 LAB — GLUCOSE, CAPILLARY
Glucose-Capillary: 52 mg/dL — ABNORMAL LOW (ref 70–99)
Glucose-Capillary: 129 mg/dL — ABNORMAL HIGH (ref 70–99)

## 2014-03-26 LAB — GLUCOSE, CAPILLARY
GLUCOSE-CAPILLARY: 63 mg/dL — AB (ref 70–99)
GLUCOSE-CAPILLARY: 148 mg/dL — AB (ref 70–99)

## 2014-03-27 LAB — GLUCOSE, CAPILLARY
GLUCOSE-CAPILLARY: 156 mg/dL — AB (ref 70–99)
Glucose-Capillary: 100 mg/dL — ABNORMAL HIGH (ref 70–99)

## 2014-03-28 LAB — GLUCOSE, CAPILLARY: Glucose-Capillary: 78 mg/dL (ref 70–99)

## 2014-03-29 LAB — GLUCOSE, CAPILLARY
Glucose-Capillary: 141 mg/dL — ABNORMAL HIGH (ref 70–99)
Glucose-Capillary: 152 mg/dL — ABNORMAL HIGH (ref 70–99)

## 2014-03-30 LAB — GLUCOSE, CAPILLARY
GLUCOSE-CAPILLARY: 81 mg/dL (ref 70–99)
Glucose-Capillary: 129 mg/dL — ABNORMAL HIGH (ref 70–99)
Glucose-Capillary: 110 mg/dL — ABNORMAL HIGH (ref 70–99)

## 2014-03-31 LAB — GLUCOSE, CAPILLARY
Glucose-Capillary: 114 mg/dL — ABNORMAL HIGH (ref 70–99)
Glucose-Capillary: 174 mg/dL — ABNORMAL HIGH (ref 70–99)

## 2014-04-01 LAB — GLUCOSE, CAPILLARY
GLUCOSE-CAPILLARY: 97 mg/dL (ref 70–99)
Glucose-Capillary: 111 mg/dL — ABNORMAL HIGH (ref 70–99)

## 2014-04-02 LAB — GLUCOSE, CAPILLARY
Glucose-Capillary: 137 mg/dL — ABNORMAL HIGH (ref 70–99)
Glucose-Capillary: 207 mg/dL — ABNORMAL HIGH (ref 70–99)

## 2014-04-03 LAB — GLUCOSE, CAPILLARY: GLUCOSE-CAPILLARY: 215 mg/dL — AB (ref 70–99)

## 2014-04-04 LAB — GLUCOSE, CAPILLARY: GLUCOSE-CAPILLARY: 112 mg/dL — AB (ref 70–99)

## 2014-04-05 LAB — GLUCOSE, CAPILLARY
Glucose-Capillary: 82 mg/dL (ref 70–99)
Glucose-Capillary: 133 mg/dL — ABNORMAL HIGH (ref 70–99)

## 2014-04-06 LAB — GLUCOSE, CAPILLARY
Glucose-Capillary: 108 mg/dL — ABNORMAL HIGH (ref 70–99)
Glucose-Capillary: 163 mg/dL — ABNORMAL HIGH (ref 70–99)
Glucose-Capillary: 242 mg/dL — ABNORMAL HIGH (ref 70–99)

## 2014-04-07 LAB — GLUCOSE, CAPILLARY
GLUCOSE-CAPILLARY: 135 mg/dL — AB (ref 70–99)
GLUCOSE-CAPILLARY: 199 mg/dL — AB (ref 70–99)

## 2014-04-08 LAB — GLUCOSE, CAPILLARY
GLUCOSE-CAPILLARY: 82 mg/dL (ref 70–99)
Glucose-Capillary: 115 mg/dL — ABNORMAL HIGH (ref 70–99)

## 2014-04-09 LAB — GLUCOSE, CAPILLARY: Glucose-Capillary: 86 mg/dL (ref 70–99)

## 2014-04-10 LAB — GLUCOSE, CAPILLARY
GLUCOSE-CAPILLARY: 90 mg/dL (ref 70–99)
Glucose-Capillary: 269 mg/dL — ABNORMAL HIGH (ref 70–99)
Glucose-Capillary: 101 mg/dL — ABNORMAL HIGH (ref 70–99)

## 2014-04-11 LAB — GLUCOSE, CAPILLARY: Glucose-Capillary: 130 mg/dL — ABNORMAL HIGH (ref 70–99)

## 2014-04-12 LAB — GLUCOSE, CAPILLARY: GLUCOSE-CAPILLARY: 124 mg/dL — AB (ref 70–99)

## 2014-04-13 LAB — GLUCOSE, CAPILLARY: Glucose-Capillary: 79 mg/dL (ref 70–99)

## 2014-04-14 LAB — GLUCOSE, CAPILLARY
GLUCOSE-CAPILLARY: 112 mg/dL — AB (ref 70–99)
Glucose-Capillary: 190 mg/dL — ABNORMAL HIGH (ref 70–99)
Glucose-Capillary: 85 mg/dL (ref 70–99)
Glucose-Capillary: 166 mg/dL — ABNORMAL HIGH (ref 70–99)
Glucose-Capillary: 204 mg/dL — ABNORMAL HIGH (ref 70–99)

## 2014-04-15 LAB — GLUCOSE, CAPILLARY: GLUCOSE-CAPILLARY: 81 mg/dL (ref 70–99)

## 2014-04-16 LAB — GLUCOSE, CAPILLARY
Glucose-Capillary: 114 mg/dL — ABNORMAL HIGH (ref 70–99)
Glucose-Capillary: 101 mg/dL — ABNORMAL HIGH (ref 70–99)
Glucose-Capillary: 134 mg/dL — ABNORMAL HIGH (ref 70–99)

## 2014-04-17 LAB — GLUCOSE, CAPILLARY: Glucose-Capillary: 80 mg/dL (ref 70–99)

## 2014-04-18 LAB — GLUCOSE, CAPILLARY: Glucose-Capillary: 177 mg/dL — ABNORMAL HIGH (ref 70–99)

## 2014-04-19 LAB — GLUCOSE, CAPILLARY
GLUCOSE-CAPILLARY: 53 mg/dL — AB (ref 70–99)
GLUCOSE-CAPILLARY: 125 mg/dL — AB (ref 70–99)
Glucose-Capillary: 229 mg/dL — ABNORMAL HIGH (ref 70–99)
Glucose-Capillary: 87 mg/dL (ref 70–99)
Glucose-Capillary: 177 mg/dL — ABNORMAL HIGH (ref 70–99)

## 2014-04-20 LAB — GLUCOSE, CAPILLARY: Glucose-Capillary: 152 mg/dL — ABNORMAL HIGH (ref 70–99)

## 2014-04-21 LAB — GLUCOSE, CAPILLARY
GLUCOSE-CAPILLARY: 98 mg/dL (ref 70–99)
GLUCOSE-CAPILLARY: 138 mg/dL — AB (ref 70–99)
Glucose-Capillary: 115 mg/dL — ABNORMAL HIGH (ref 70–99)

## 2014-04-22 LAB — GLUCOSE, CAPILLARY: Glucose-Capillary: 94 mg/dL (ref 70–99)

## 2014-04-23 LAB — GLUCOSE, CAPILLARY
GLUCOSE-CAPILLARY: 211 mg/dL — AB (ref 70–99)
Glucose-Capillary: 123 mg/dL — ABNORMAL HIGH (ref 70–99)

## 2014-04-24 LAB — GLUCOSE, CAPILLARY
GLUCOSE-CAPILLARY: 75 mg/dL (ref 70–99)
Glucose-Capillary: 157 mg/dL — ABNORMAL HIGH (ref 70–99)

## 2014-04-25 LAB — GLUCOSE, CAPILLARY
GLUCOSE-CAPILLARY: 103 mg/dL — AB (ref 70–99)
GLUCOSE-CAPILLARY: 176 mg/dL — AB (ref 70–99)
Glucose-Capillary: 183 mg/dL — ABNORMAL HIGH (ref 70–99)

## 2014-04-26 LAB — GLUCOSE, CAPILLARY: Glucose-Capillary: 135 mg/dL — ABNORMAL HIGH (ref 70–99)

## 2014-04-27 LAB — GLUCOSE, CAPILLARY
GLUCOSE-CAPILLARY: 183 mg/dL — AB (ref 70–99)
Glucose-Capillary: 236 mg/dL — ABNORMAL HIGH (ref 70–99)
Glucose-Capillary: 195 mg/dL — ABNORMAL HIGH (ref 70–99)
Glucose-Capillary: 257 mg/dL — ABNORMAL HIGH (ref 70–99)

## 2014-04-28 LAB — GLUCOSE, CAPILLARY: GLUCOSE-CAPILLARY: 212 mg/dL — AB (ref 70–99)

## 2014-04-29 LAB — GLUCOSE, CAPILLARY
Glucose-Capillary: 261 mg/dL — ABNORMAL HIGH (ref 70–99)
Glucose-Capillary: 206 mg/dL — ABNORMAL HIGH (ref 70–99)

## 2014-04-30 LAB — GLUCOSE, CAPILLARY
GLUCOSE-CAPILLARY: 219 mg/dL — AB (ref 70–99)
GLUCOSE-CAPILLARY: 219 mg/dL — AB (ref 70–99)
Glucose-Capillary: 123 mg/dL — ABNORMAL HIGH (ref 70–99)

## 2014-05-01 LAB — GLUCOSE, CAPILLARY
GLUCOSE-CAPILLARY: 143 mg/dL — AB (ref 70–99)
Glucose-Capillary: 168 mg/dL — ABNORMAL HIGH (ref 70–99)

## 2014-05-02 LAB — GLUCOSE, CAPILLARY
Glucose-Capillary: 126 mg/dL — ABNORMAL HIGH (ref 70–99)
Glucose-Capillary: 129 mg/dL — ABNORMAL HIGH (ref 70–99)

## 2014-05-03 LAB — GLUCOSE, CAPILLARY
GLUCOSE-CAPILLARY: 225 mg/dL — AB (ref 70–99)
Glucose-Capillary: 222 mg/dL — ABNORMAL HIGH (ref 70–99)

## 2014-05-04 ENCOUNTER — Non-Acute Institutional Stay (SKILLED_NURSING_FACILITY): Payer: Commercial Managed Care - HMO | Admitting: Internal Medicine

## 2014-05-04 ENCOUNTER — Encounter: Payer: Self-pay | Admitting: Internal Medicine

## 2014-05-04 DIAGNOSIS — M79609 Pain in unspecified limb: Secondary | ICD-10-CM

## 2014-05-04 DIAGNOSIS — I63512 Cerebral infarction due to unspecified occlusion or stenosis of left middle cerebral artery: Secondary | ICD-10-CM

## 2014-05-04 DIAGNOSIS — I10 Essential (primary) hypertension: Secondary | ICD-10-CM

## 2014-05-04 DIAGNOSIS — I6350 Cerebral infarction due to unspecified occlusion or stenosis of unspecified cerebral artery: Secondary | ICD-10-CM

## 2014-05-04 DIAGNOSIS — M79641 Pain in right hand: Secondary | ICD-10-CM

## 2014-05-04 DIAGNOSIS — E119 Type 2 diabetes mellitus without complications: Secondary | ICD-10-CM

## 2014-05-04 LAB — GLUCOSE, CAPILLARY
Glucose-Capillary: 132 mg/dL — ABNORMAL HIGH (ref 70–99)
Glucose-Capillary: 394 mg/dL — ABNORMAL HIGH (ref 70–99)

## 2014-05-04 IMAGING — CR DG CHEST 1V PORT
1 series · 1 of 1 positions shown · non-contrast
Comparison: 12/12/2013.

CLINICAL DATA: Hypoxia.

EXAM:
PORTABLE CHEST - 1 VIEW

[portable]
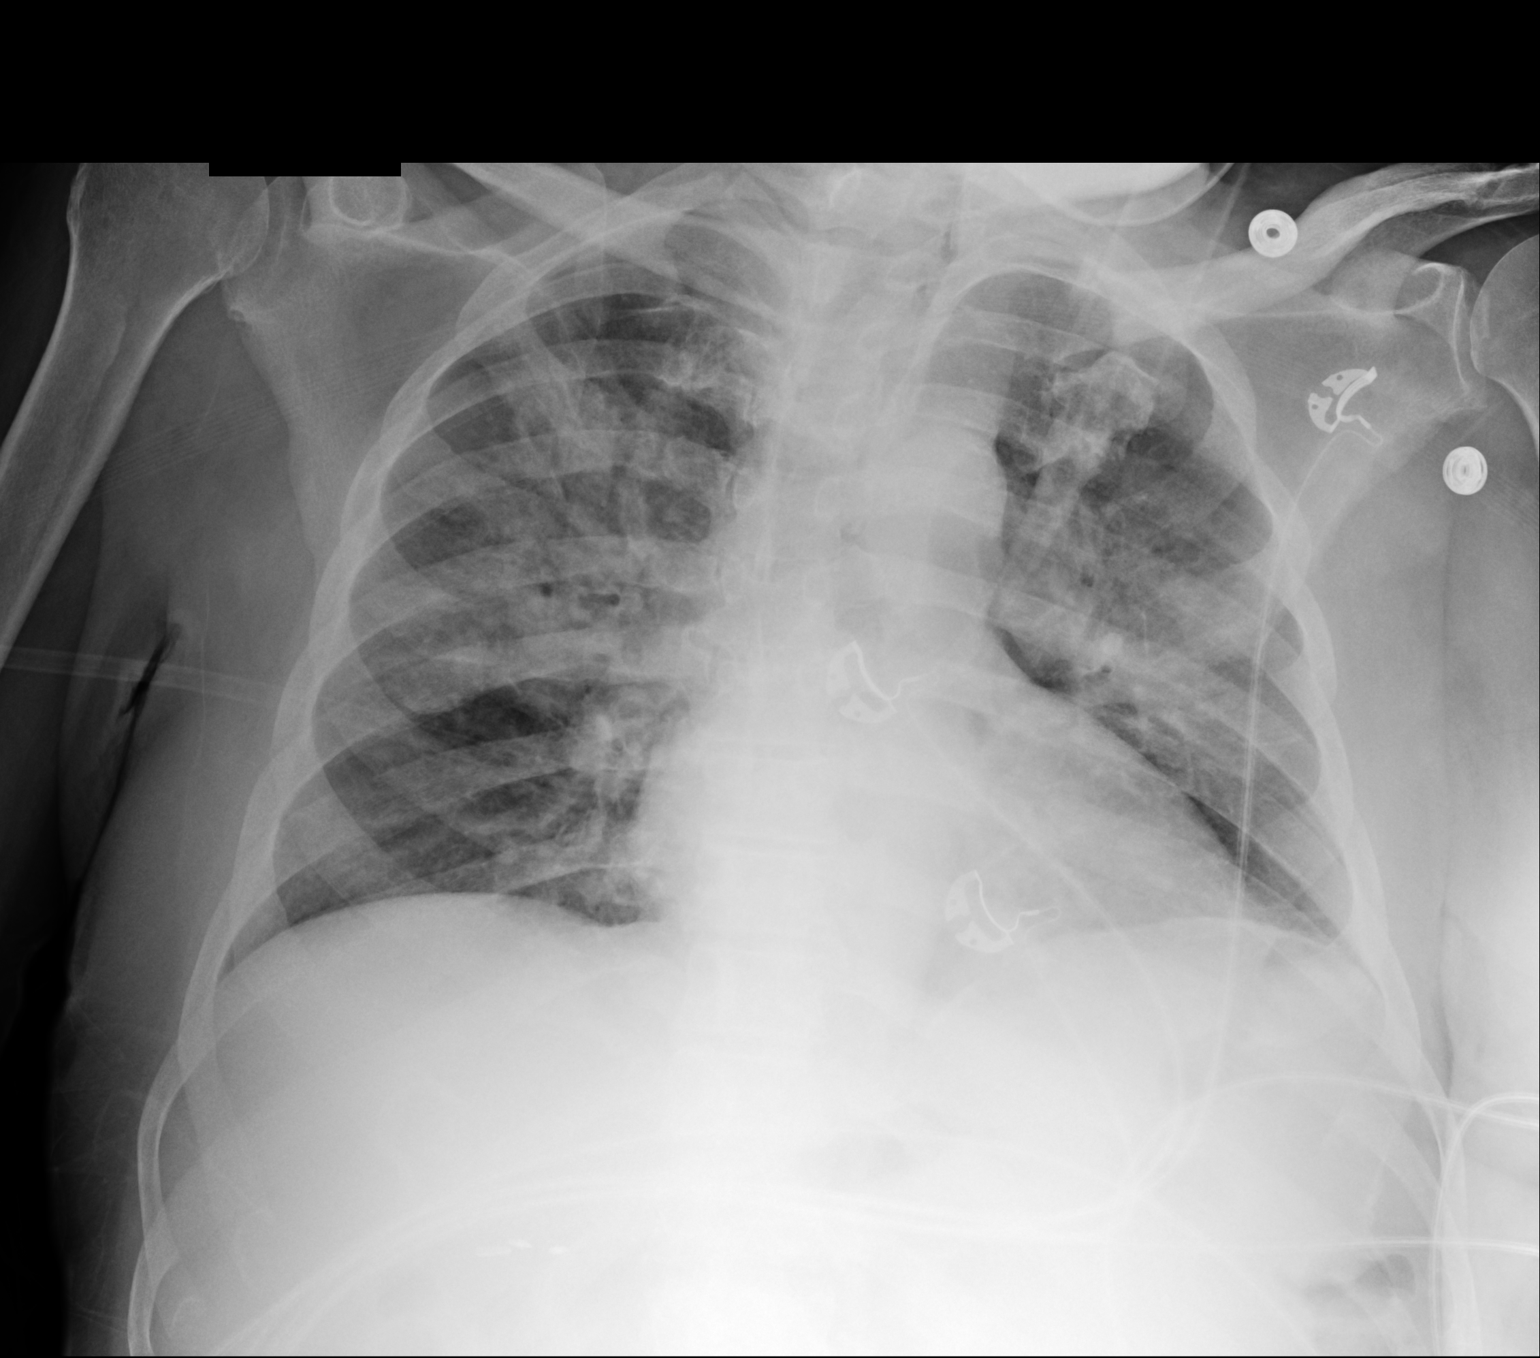

[1 of 1 positions shown; findings below may reference images not displayed]

FINDINGS: Bilateral perihilar infiltrates may represent infectious infiltrate
versus pulmonary edema. Clinical correlation recommended. Findings
have changed minimally since prior exam.

No gross pneumothorax.

Heart size top-normal.

Calcified aorta.
IMPRESSION: Bilateral perihilar infiltrates may represent infectious infiltrate
versus pulmonary edema. Clinical correlation recommended. Findings
have changed minimally since prior exam.

## 2014-05-04 NOTE — Progress Notes (Signed)
Patient ID: Brady Frazier, male   DOB: April 20, 1941, 73 y.o.   MRN: [id number]   This is a routine  visit.  Level of care skilled.  [facility]   Chief complaint-medical management of CVA late effect-diabetes type 2-hypertension-question pseudogout-depression-   History of present illness .  Patient is a 73 year old male with a history of CVA status post intracerebral hemorrhage- who was admitted to the hospital from this facility on March 1 secondary to hypoxia  This was diagnosed as secondary to pneumonia-there was bilateral infiltrates doubtful aspiration improved on antibiotics  This appears to have been stable recently  .  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  Later in the day average appears to be more in the mid higher 100s ranging from 138-236  -  .  Also a history of acute on chronic renal failure however this appears to be stable--creatinine last June  was 1.84 with a BUN of 26 we will update this   Patient also has a history of quite significant right hand pain this was thought possibly gout and his hydrochlorothiazide was discontinued in the hospital--this has been assessed by our service and he does continue on colchicine once a day apparently this is getting significant relief he is also on tramadol routinely in the morning as well as when necessary throughout the day  Patient's BNP was also elevated on admission to the hospital but there was no clinical evidence to suggest an  acute CHF episode-x-ray findings strongly suggested pneumonia as well as his clinical presentation.--Again this appears to have been stable for some time  .  Family medical social history hasbeen reviewed  Past Medical History   Diagnosis  Date   .  Diabetes mellitus    .  Hypertension    .  Stroke    .  Kidney disease     Past Surgical History   Procedure  Laterality  Date   .  Stomach surgery     .  Tee without cardioversion  N/A  02/25/2013     Procedure: TRANSESOPHAGEAL ECHOCARDIOGRAM (TEE); Surgeon: Lewayne BuntingBrian S Crenshaw, MD; [facility]; Service: Cardiovascular; Laterality: N/A; Rosann Auerbachtrish Fawn Kirk/ja     Medications have been reviewed.  Tylenol 325 mg-take 650 mg every 4 hours when necessary pain or fever.  Norvasc 5 mg daily.  Lipitor 20 mg daily. .  Colchicine 0.6 mg once a day.  Duonebs every 6 hours.  Lantus 5 units each bedtime.  Zoloft 50 mg daily.  Tramadol 50 mg Every morning and every 6 hours when necessary   Hospital studies.  12/12/2013-chest x-ray-bilateral airspace lung opacities reflecting multifocal pneumonia or asymmetric edema.  12/13/2013.  Blood culture no growth determined after 3 days  .  Family medical social history quite limited not available from any current source-- he has been a resident of this facility for some time i--does have a supportive family-wife who visits often  .  Review of systems -- essentially unobtainable from patient per nursing-no shortness of breath or  congestion noted no complaints of chest pain nursing feels he is essentially at his baseline --he does continue to complain at times of right hand pain at times with significant palpation but this has improved --according to nursing he does have a spotty appetite it appears he's lost about 5 pounds the past month  .  Physical exam  Temperature 98.7 pulse 81 respirations 19 blood pressure 121/63 IC occasional systolic spikes but I suspect this is due to agitation and is not consistent  past-- recent weight 139.2 this appears to be a loss of about 5 pounds since last month   In general this is a frail elderly male in no distress . Sitting comfortably in his wheelchair   His skin is warm and dry   Eyes pupils appear reactive to light sclerae and conjunctivae clear.   Oropharynx from what I can tell is clear although he did not open his mouth very wide  .  Chest is clear to auscultation with reduced breath sounds no labored breathing--poor respiratory effort  .  Heart is regular rate and rhythm without murmur gallop or rub he does not have significant edema  .  Abdomen soft nontender with positive bowel sounds.   Muscle skeletal he has right-sided hemiparesis-- right hand area there is some tenderness to palpation but this not appear to--did not note any acute deformities  radial pulse is intact he has some general lower extremity stiffness and. contractures-he has splints applied to both arms  -  Neurologic he does have again right upper extremity paralysis - -  Psych-he does not really speak significantly appears to have severe dementia.   Labs. 03/14/2014.  WBC 7.0 hemoglobin 10.5 platelets 267.  Sodium 143 potassium 4.5 BUN 26 creatinine 1.84.  Liver function tests within normal limits except albumin of 2.8.  Uric acid level 6.7.   hemoglobin A1c 5.8.    01/10/2014.  Sodium 143 potassium 4.4 BUN 24 creatinine 2.04.  12/20/2013.  WBC 9.2 hemoglobin 9.3 platelets 3:30.  12/16/2013.  Sodium 145 potassium 4.2 BUN 40 creatinine 2.4.  WBC 8.6 hemoglobin 8.6 platelets 255  11/24/2013.  Hemoglobin A1c 6.8.  Liver function tests within normal limits except albumin of 2.6 uric acid was 7.6.  Marland Kitchen  Assessment and plan.  #1-pneumonia with history of acute hypoxic respiratory failure-patient appears to be back at his -has nebulizers every 6 hours when necessary baseline--  #2 history of CVA-continues with right-sided hemiparesis this has been quite significant  but appears to be at his baseline. --Continue supportive care. He also is on a statin --will update a lipid panel recent liver function tests are unremarkable except for albumin of 2.8    #3 diabetes type 2-  Continue to monitor he is on low dose Lantus--- CBGs appear fairly satisfactory although variable hemoglobin A1c was satisfactory  .  #4-history of right hand pain-this is thought possibly to be gout or pseudogout he is on a course of colchicine this appears to be helping as well as the tramadol routinely in the morning and when necessary throughout the day every 6 hours--   5-history of chronic renal insufficiency-this appears to be  at baseline Will recheck this   #7-anemia-most likely chronic disease this will have to be updated as well --most recent lab actually showed improvement   #8-hypertension-we'll continue to monitor this appears stable he is on Norvasc   #9-depression-he continues on Zoloft  #10 weight loss-will monitor  weights q. weekly notify provider loss greater than  3 pounds-he is on Magic cup at lunch-will have dietary address this as well   [id number]

## 2014-05-05 LAB — GLUCOSE, CAPILLARY
GLUCOSE-CAPILLARY: 268 mg/dL — AB (ref 70–99)
Glucose-Capillary: 128 mg/dL — ABNORMAL HIGH (ref 70–99)

## 2014-05-06 LAB — GLUCOSE, CAPILLARY
GLUCOSE-CAPILLARY: 300 mg/dL — AB (ref 70–99)
Glucose-Capillary: 127 mg/dL — ABNORMAL HIGH (ref 70–99)

## 2014-05-07 LAB — GLUCOSE, CAPILLARY: GLUCOSE-CAPILLARY: 164 mg/dL — AB (ref 70–99)

## 2014-05-08 LAB — GLUCOSE, CAPILLARY: Glucose-Capillary: 147 mg/dL — ABNORMAL HIGH (ref 70–99)

## 2014-05-09 LAB — GLUCOSE, CAPILLARY
GLUCOSE-CAPILLARY: 317 mg/dL — AB (ref 70–99)
Glucose-Capillary: 325 mg/dL — ABNORMAL HIGH (ref 70–99)
Glucose-Capillary: 195 mg/dL — ABNORMAL HIGH (ref 70–99)

## 2014-05-10 ENCOUNTER — Emergency Department (HOSPITAL_COMMUNITY): Payer: Medicare HMO

## 2014-05-10 ENCOUNTER — Inpatient Hospital Stay (HOSPITAL_COMMUNITY)
Admission: EM | Admit: 2014-05-10 | Discharge: 2014-05-17 | DRG: 871 | Disposition: A | Discharge status: Skilled Nursing Facility | Payer: Medicare HMO | Attending: Internal Medicine | Admitting: Internal Medicine

## 2014-05-10 ENCOUNTER — Encounter (HOSPITAL_COMMUNITY): Payer: Self-pay | Admitting: Emergency Medicine

## 2014-05-10 ENCOUNTER — Ambulatory Visit (HOSPITAL_COMMUNITY): Payer: Medicare HMO

## 2014-05-10 ENCOUNTER — Non-Acute Institutional Stay (SKILLED_NURSING_FACILITY): Payer: Commercial Managed Care - HMO | Admitting: Internal Medicine

## 2014-05-10 DIAGNOSIS — R6520 Severe sepsis without septic shock: Secondary | ICD-10-CM

## 2014-05-10 DIAGNOSIS — R4182 Altered mental status, unspecified: Secondary | ICD-10-CM | POA: Diagnosis not present

## 2014-05-10 DIAGNOSIS — D72829 Elevated white blood cell count, unspecified: Secondary | ICD-10-CM

## 2014-05-10 DIAGNOSIS — M7552 Bursitis of left shoulder: Secondary | ICD-10-CM

## 2014-05-10 DIAGNOSIS — I129 Hypertensive chronic kidney disease with stage 1 through stage 4 chronic kidney disease, or unspecified chronic kidney disease: Secondary | ICD-10-CM | POA: Diagnosis present

## 2014-05-10 DIAGNOSIS — E1121 Type 2 diabetes mellitus with diabetic nephropathy: Secondary | ICD-10-CM

## 2014-05-10 DIAGNOSIS — I69320 Aphasia following cerebral infarction: Secondary | ICD-10-CM

## 2014-05-10 DIAGNOSIS — G92 Toxic encephalopathy: Secondary | ICD-10-CM | POA: Diagnosis present

## 2014-05-10 DIAGNOSIS — I69351 Hemiplegia and hemiparesis following cerebral infarction affecting right dominant side: Secondary | ICD-10-CM

## 2014-05-10 DIAGNOSIS — Q211 Atrial septal defect: Secondary | ICD-10-CM

## 2014-05-10 DIAGNOSIS — J9601 Acute respiratory failure with hypoxia: Secondary | ICD-10-CM

## 2014-05-10 DIAGNOSIS — N183 Chronic kidney disease, stage 3 unspecified: Secondary | ICD-10-CM | POA: Diagnosis present

## 2014-05-10 DIAGNOSIS — I10 Essential (primary) hypertension: Secondary | ICD-10-CM

## 2014-05-10 DIAGNOSIS — E440 Moderate protein-calorie malnutrition: Secondary | ICD-10-CM | POA: Diagnosis present

## 2014-05-10 DIAGNOSIS — M6281 Muscle weakness (generalized): Secondary | ICD-10-CM

## 2014-05-10 DIAGNOSIS — A419 Sepsis, unspecified organism: Principal | ICD-10-CM | POA: Diagnosis present

## 2014-05-10 DIAGNOSIS — R059 Cough, unspecified: Secondary | ICD-10-CM | POA: Insufficient documentation

## 2014-05-10 DIAGNOSIS — Q2112 Patent foramen ovale: Secondary | ICD-10-CM

## 2014-05-10 DIAGNOSIS — M25519 Pain in unspecified shoulder: Secondary | ICD-10-CM

## 2014-05-10 DIAGNOSIS — R05 Cough: Secondary | ICD-10-CM | POA: Insufficient documentation

## 2014-05-10 DIAGNOSIS — J189 Pneumonia, unspecified organism: Secondary | ICD-10-CM | POA: Insufficient documentation

## 2014-05-10 DIAGNOSIS — N189 Chronic kidney disease, unspecified: Secondary | ICD-10-CM

## 2014-05-10 DIAGNOSIS — Z66 Do not resuscitate: Secondary | ICD-10-CM | POA: Diagnosis present

## 2014-05-10 DIAGNOSIS — G929 Unspecified toxic encephalopathy: Secondary | ICD-10-CM | POA: Diagnosis present

## 2014-05-10 DIAGNOSIS — J9610 Chronic respiratory failure, unspecified whether with hypoxia or hypercapnia: Secondary | ICD-10-CM | POA: Diagnosis present

## 2014-05-10 DIAGNOSIS — E785 Hyperlipidemia, unspecified: Secondary | ICD-10-CM | POA: Diagnosis present

## 2014-05-10 DIAGNOSIS — R5381 Other malaise: Secondary | ICD-10-CM | POA: Insufficient documentation

## 2014-05-10 DIAGNOSIS — E1129 Type 2 diabetes mellitus with other diabetic kidney complication: Secondary | ICD-10-CM

## 2014-05-10 DIAGNOSIS — I77810 Thoracic aortic ectasia: Secondary | ICD-10-CM | POA: Insufficient documentation

## 2014-05-10 DIAGNOSIS — N390 Urinary tract infection, site not specified: Secondary | ICD-10-CM

## 2014-05-10 DIAGNOSIS — E119 Type 2 diabetes mellitus without complications: Secondary | ICD-10-CM | POA: Diagnosis present

## 2014-05-10 DIAGNOSIS — I69959 Hemiplegia and hemiparesis following unspecified cerebrovascular disease affecting unspecified side: Secondary | ICD-10-CM

## 2014-05-10 DIAGNOSIS — I63512 Cerebral infarction due to unspecified occlusion or stenosis of left middle cerebral artery: Secondary | ICD-10-CM

## 2014-05-10 DIAGNOSIS — N179 Acute kidney failure, unspecified: Secondary | ICD-10-CM | POA: Diagnosis present

## 2014-05-10 DIAGNOSIS — E875 Hyperkalemia: Secondary | ICD-10-CM

## 2014-05-10 DIAGNOSIS — E870 Hyperosmolality and hypernatremia: Secondary | ICD-10-CM | POA: Diagnosis present

## 2014-05-10 DIAGNOSIS — I69920 Aphasia following unspecified cerebrovascular disease: Secondary | ICD-10-CM

## 2014-05-10 DIAGNOSIS — R5383 Other fatigue: Secondary | ICD-10-CM

## 2014-05-10 DIAGNOSIS — N10 Acute tubulo-interstitial nephritis: Secondary | ICD-10-CM | POA: Diagnosis present

## 2014-05-10 DIAGNOSIS — G9341 Metabolic encephalopathy: Secondary | ICD-10-CM

## 2014-05-10 DIAGNOSIS — Z794 Long term (current) use of insulin: Secondary | ICD-10-CM

## 2014-05-10 DIAGNOSIS — N058 Unspecified nephritic syndrome with other morphologic changes: Secondary | ICD-10-CM

## 2014-05-10 DIAGNOSIS — F0150 Vascular dementia without behavioral disturbance: Secondary | ICD-10-CM | POA: Diagnosis present

## 2014-05-10 DIAGNOSIS — M79641 Pain in right hand: Secondary | ICD-10-CM

## 2014-05-10 LAB — CBC WITH DIFFERENTIAL/PLATELET
Basophils Absolute: 0 10*3/uL (ref 0.0–0.1)
Basophils Relative: 0 % (ref 0–1)
Eosinophils Absolute: 0.1 10*3/uL (ref 0.0–0.7)
Eosinophils Relative: 1 % (ref 0–5)
HEMATOCRIT: 29.3 % — AB (ref 39.0–52.0)
Hemoglobin: 9.8 g/dL — ABNORMAL LOW (ref 13.0–17.0)
LYMPHS PCT: 8 % — AB (ref 12–46)
Lymphs Abs: 1.2 10*3/uL (ref 0.7–4.0)
MCH: 29.3 pg (ref 26.0–34.0)
MCHC: 33.4 g/dL (ref 30.0–36.0)
MCV: 87.5 fL (ref 78.0–100.0)
MONO ABS: 1.2 10*3/uL — AB (ref 0.1–1.0)
Monocytes Relative: 8 % (ref 3–12)
NEUTROS ABS: 12.9 10*3/uL — AB (ref 1.7–7.7)
Neutrophils Relative %: 83 % — ABNORMAL HIGH (ref 43–77)
Platelets: 356 10*3/uL (ref 150–400)
RBC: 3.35 MIL/uL — ABNORMAL LOW (ref 4.22–5.81)
RDW: 13.1 % (ref 11.5–15.5)
WBC: 15.4 10*3/uL — AB (ref 4.0–10.5)

## 2014-05-10 LAB — URINALYSIS, ROUTINE W REFLEX MICROSCOPIC
BILIRUBIN URINE: NEGATIVE
Glucose, UA: >1000 mg/dL — AB
KETONES UR: NEGATIVE mg/dL
Leukocytes, UA: NEGATIVE
NITRITE: NEGATIVE
PH: 5.5 (ref 5.0–8.0)
PROTEIN: 100 mg/dL — AB
Specific Gravity, Urine: 1.015 (ref 1.005–1.030)
Urobilinogen, UA: 0.2 mg/dL (ref 0.0–1.0)

## 2014-05-10 LAB — I-STAT CG4 LACTIC ACID, ED: Lactic Acid, Venous: 1.94 mmol/L (ref 0.5–2.2)

## 2014-05-10 LAB — COMPREHENSIVE METABOLIC PANEL
ALT: 32 U/L (ref 0–53)
AST: 25 U/L (ref 0–37)
Albumin: 2.1 g/dL — ABNORMAL LOW (ref 3.5–5.2)
Alkaline Phosphatase: 82 U/L (ref 39–117)
Anion gap: 11 (ref 5–15)
BILIRUBIN TOTAL: 0.2 mg/dL — AB (ref 0.3–1.2)
BUN: 53 mg/dL — ABNORMAL HIGH (ref 6–23)
CHLORIDE: 110 meq/L (ref 96–112)
CO2: 24 meq/L (ref 19–32)
CREATININE: 2.38 mg/dL — AB (ref 0.50–1.35)
Calcium: 8.5 mg/dL (ref 8.4–10.5)
GFR, EST AFRICAN AMERICAN: 29 mL/min — AB (ref >90)
GFR, EST NON AFRICAN AMERICAN: 25 mL/min — AB (ref >90)
GLUCOSE: 493 mg/dL — AB (ref 70–99)
Potassium: 5.4 mEq/L — ABNORMAL HIGH (ref 3.7–5.3)
Sodium: 145 mEq/L (ref 137–147)
Total Protein: 6.9 g/dL (ref 6.0–8.3)

## 2014-05-10 LAB — PROTIME-INR
INR: 1.3 (ref 0.00–1.49)
Prothrombin Time: 16.2 seconds — ABNORMAL HIGH (ref 11.6–15.2)

## 2014-05-10 LAB — GLUCOSE, CAPILLARY
GLUCOSE-CAPILLARY: 195 mg/dL — AB (ref 70–99)
Glucose-Capillary: 259 mg/dL — ABNORMAL HIGH (ref 70–99)
Glucose-Capillary: 446 mg/dL — ABNORMAL HIGH (ref 70–99)

## 2014-05-10 LAB — URINE MICROSCOPIC-ADD ON

## 2014-05-10 LAB — PROCALCITONIN: PROCALCITONIN: 0.23 ng/mL

## 2014-05-10 MED ORDER — INSULIN ASPART 100 UNIT/ML ~~LOC~~ SOLN
10.0000 [IU] | Freq: Once | SUBCUTANEOUS | Status: AC
  Administered 2014-05-10: 10 [IU] via INTRAVENOUS
  Filled 2014-05-10: qty 1

## 2014-05-10 MED ORDER — DEXTROSE 5 % IV SOLN
1.0000 g | Freq: Once | INTRAVENOUS | Status: AC
  Administered 2014-05-10: 1 g via INTRAVENOUS
  Filled 2014-05-10: qty 10

## 2014-05-10 MED ORDER — SODIUM CHLORIDE 0.9 % IV SOLN
1000.0000 mL | INTRAVENOUS | Status: DC
  Administered 2014-05-10: 1000 mL via INTRAVENOUS

## 2014-05-10 MED ORDER — SODIUM CHLORIDE 0.9 % IV SOLN
1.0000 g | Freq: Once | INTRAVENOUS | Status: AC
  Administered 2014-05-10: 1 g via INTRAVENOUS
  Filled 2014-05-10: qty 10

## 2014-05-10 NOTE — ED Notes (Signed)
Mr. Dan HumphreysWalker family has given Tana ConchRon Flack [phone] permission to give information on his condition.  Call Tana Conchon Flack with any updates.

## 2014-05-10 NOTE — Progress Notes (Signed)
Patient ID: Caryl AspLenward Sokolov, male   DOB: 11/17/1940, 73 y.o.   MRN: [id number]   This is acute  visit.  Level of care skilled.  [facility]   Chief complaint. Acute visit secondary to question pneumonia-leukocytosis-renal insufficiency.  History of present illness. Patient is a 73 year old male with a history of CVA status post intracerebral hemorrhage-also some history of pneumonia with hypoxia.  He is also a type II diabetic currently on Lantus.  Routine labs have come back showing a mildly elevated white count of 12.7-also sodium of 149 and creatinine of 2.11-he does have some renal insufficiency with baseline creatinine more in the high ones.  A urine culture was ordered which appears to be negative chest x-ray showed lower lobe airspace opacities atelectasis versus aspiration or infiltrate According to nursing staff he is eating and drinking fairly poorly.--He is on the Pureed diet  In regards to renal function he does have baseline renal insufficiency as noted above-he is on colchicine for a history of gout empirically-- at this point will hold this secondary to his diminished renal function  Family medical social history as been reviewed also recently progress note 05/04/2014.  Medications have been reviewed per MAR.  Review of systems.  Socially and attainable secondary to severe dementia-per nursing staff is not eating and drinking very well he is not complaining of any pain or shortness of breath.  Physical exam.  Temperature 98.4 pulse 90 respirations 20 blood pressure 134/60-O2 saturation 94% on room air.  In general this is a frail elderly male in no distress lying in bed.  His skin is warm and dry.  Pupils appear reactive to light.  Oropharynx difficult exam since patient would not really open-mouth.  Chest clear to auscultation but poor respiratory effort no labored breathing.  Heart is regular rate and rhythm without murmur gallop or rub  He does not  appear to have significant lower extremity edema.  Abdomen is soft and nontender there are positive bowel sounds.  Muscle skeletal continues with right-sided hemiparesis this is baseline with general frailty.  Neurologic as stated above I do not see any focal changes from previous exams again with right-sided hemiparesis.  Psych speaks minimally has difficulty following simple verbal commands although this is not new.  Labs.  05/09/2014.  WBC 12.7 hemoglobin 9.9 platelets 371.  Sodium 149 potassium 4.7 BUN 47 creatinine 2.1.  Assessment plan.  #1-mild leukocytosis with x-ray suggesting possibly pneumonia-we'll treat empirically with Avelox 400 mg a day for 7 days-also will update CBC with differential and metabolic panel tomorrow to keep an eyeI on his white count and differential as well as obtaining update on his renal function.  #2 history of renal insufficiency with mildly elevated sodium-will write  order to encourage fluids strongly and we'll see what the lab tells us tomorrow-patient would be a challenging candidate for an IV I suspect the risk of him pulling this out would be quite high--Will hold colchicine for now as well   3-history of diabetes type 2 he is on low dose Lantus blood sugars have been elevated recently later in the day although morning sugars appeared average mid higher 100s fairly consistently-I do see fairly frequent 2--300s recently at night -- at this point will monitor the higher blood sugars at night may be due to an infectious process as noted above-we'll monitor this a.c. and at bedtime for now just to see how he runs throughout all day part  [id number]    .

## 2014-05-10 NOTE — ED Notes (Signed)
Pt having altered mental status, diaphoresis and fever.

## 2014-05-10 NOTE — ED Provider Notes (Signed)
CSN: [id number]     Arrival date & time 05/10/14  2057 History  This chart was scribed for Gerhard Munch, MD by Nicholos Johns, ED scribe. This patient was seen in room APA12/APA12 and the patient's care was started at 9:05 PM.   Chief Complaint  Patient presents with  . Altered Mental Status   LEVEL 5 CAVEAT  The history is provided by medical records. The history is limited by the condition of the patient. No language interpreter was used.   HPI Comments: Brady Frazier is a 73 y.o. male who presents to the Emergency Department for altered mental status. Pt presents with mouth in open position and unresponsive but awake. Per nurse, pt has fever and diaphoresis. Pt is DNR.  Past Medical History  Diagnosis Date  . Diabetes mellitus   . Hypertension   . Stroke   . Kidney disease   . Aphasia   . Hyperlipidemia   . Unspecified cerebral artery occlusion with cerebral infarction   . Chronic respiratory failure   . Pneumonia, organism unspecified   . Hemiplegia affecting dominant side, late effect of cerebrovascular disease   . Chronic kidney disease, unspecified   . Dysphagia, oropharyngeal phase   . Aphasia, late effect of cerebrovascular disease    Past Surgical History  Procedure Laterality Date  . Stomach surgery    . Tee without cardioversion N/A 02/25/2013    Procedure: TRANSESOPHAGEAL ECHOCARDIOGRAM (TEE);  Surgeon: Lewayne Bunting, MD;  Location: [facility] ENDOSCOPY;  Service: Cardiovascular;  Laterality: N/A;  Rosann Auerbach Fawn Kirk   No family history on file. History  Substance Use Topics  . Smoking status: Never Smoker   . Smokeless tobacco: Not on file  . Alcohol Use: No    Review of Systems  Unable to perform ROS  Allergies  Review of patient's allergies indicates no known allergies.  Home Medications   Prior to Admission medications   Medication Sig Start Date End Date Taking? Authorizing Provider  acetaminophen (TYLENOL) 325 MG tablet Take 650 mg by mouth every 4  (four) hours as needed. Pain/fever    Historical Provider, MD  amLODipine (NORVASC) 5 MG tablet Take 5 mg by mouth daily. For HTN    Historical Provider, MD  atorvastatin (LIPITOR) 20 MG tablet Take 20 mg by mouth daily. For hyperlipidemia    Historical Provider, MD  colchicine 0.6 MG tablet Take 0.6 mg by mouth 2 (two) times daily.    Historical Provider, MD  insulin glargine (LANTUS) 100 UNIT/ML injection Inject 0.05 mLs (5 Units total) into the skin at bedtime. 12/16/13   Standley Brooking, MD  ipratropium-albuterol (DUONEB) 0.5-2.5 (3) MG/3ML SOLN Take 3 mLs by nebulization every 6 (six) hours.    Historical Provider, MD  sertraline (ZOLOFT) 50 MG tablet Take 1 tablet (50 mg total) by mouth daily. 11/23/13   Maxwell Caul, MD  traMADol (ULTRAM) 50 MG tablet Take 50 mg by mouth every morning. Take one tablet every morning and every 6 hours when necessary. Hold for sedation or increased confusion 12/21/13   Kimber Relic, MD   Triage vitals: BP 138/71  Pulse 88  Temp(Src) 100.4 F (38 C) (Rectal)  SpO2 98%  Physical Exam  Nursing note and vitals reviewed. Constitutional: No distress.  HENT:  Head: Normocephalic and atraumatic.  Eyes: Conjunctivae are normal.  Neck: Neck supple.  Cardiovascular: Normal rate.   Pulmonary/Chest: Effort normal. No respiratory distress.  Neurological: He is alert.  Skin: Skin is warm and dry.  ED Course  Procedures (including critical care time)  COORDINATION OF CARE: 9:09 PM: Pt will have basic lab work and a CXR completed.   Labs Review Labs Reviewed  MRSA PCR SCREENING - Abnormal; Notable for the following:    MRSA by PCR POSITIVE (*)    All other components within normal limits  CBC WITH DIFFERENTIAL - Abnormal; Notable for the following:    WBC 15.4 (*)    RBC 3.35 (*)    Hemoglobin 9.8 (*)    HCT 29.3 (*)    Neutrophils Relative % 83 (*)    Neutro Abs 12.9 (*)    Lymphocytes Relative 8 (*)    Monocytes Absolute 1.2 (*)    All  other components within normal limits  COMPREHENSIVE METABOLIC PANEL - Abnormal; Notable for the following:    Potassium 5.4 (*)    Glucose, Bld 493 (*)    BUN 53 (*)    Creatinine, Ser 2.38 (*)    Albumin 2.1 (*)    Total Bilirubin 0.2 (*)    GFR calc non Af Amer 25 (*)    GFR calc Af Amer 29 (*)    All other components within normal limits  URINALYSIS, ROUTINE W REFLEX MICROSCOPIC - Abnormal; Notable for the following:    Glucose, UA >1000 (*)    Hgb urine dipstick SMALL (*)    Protein, ur 100 (*)    All other components within normal limits  PROTIME-INR - Abnormal; Notable for the following:    Prothrombin Time 16.2 (*)    All other components within normal limits  URINE MICROSCOPIC-ADD ON - Abnormal; Notable for the following:    Bacteria, UA MANY (*)    Casts HYALINE CASTS (*)    All other components within normal limits  BASIC METABOLIC PANEL - Abnormal; Notable for the following:    Sodium 152 (*)    Chloride 118 (*)    Glucose, Bld 164 (*)    BUN 45 (*)    Creatinine, Ser 2.18 (*)    GFR calc non Af Amer 28 (*)    GFR calc Af Amer 33 (*)    All other components within normal limits  CBC - Abnormal; Notable for the following:    WBC 16.8 (*)    RBC 3.34 (*)    Hemoglobin 9.7 (*)    HCT 29.3 (*)    All other components within normal limits  GLUCOSE, CAPILLARY - Abnormal; Notable for the following:    Glucose-Capillary 214 (*)    All other components within normal limits  GLUCOSE, CAPILLARY - Abnormal; Notable for the following:    Glucose-Capillary 143 (*)    All other components within normal limits  GLUCOSE, CAPILLARY - Abnormal; Notable for the following:    Glucose-Capillary 117 (*)    All other components within normal limits  GLUCOSE, CAPILLARY - Abnormal; Notable for the following:    Glucose-Capillary 109 (*)    All other components within normal limits  BASIC METABOLIC PANEL - Abnormal; Notable for the following:    Sodium 148 (*)    Chloride 113 (*)     Glucose, Bld 153 (*)    BUN 40 (*)    Creatinine, Ser 2.10 (*)    GFR calc non Af Amer 29 (*)    GFR calc Af Amer 34 (*)    All other components within normal limits  GLUCOSE, CAPILLARY - Abnormal; Notable for the following:    Glucose-Capillary 149 (*)  All other components within normal limits  CULTURE, BLOOD (ROUTINE X 2)  CULTURE, BLOOD (ROUTINE X 2)  URINE CULTURE  URINE CULTURE  PROCALCITONIN  CBC WITH DIFFERENTIAL  COMPREHENSIVE METABOLIC PANEL  I-STAT CG4 LACTIC ACID, ED    Imaging Review Dg Chest 1 View  05/10/2014   CLINICAL DATA:  Cough, weakness.  EXAM: CHEST - 1 VIEW  COMPARISON:  12/14/1948  FINDINGS: Aortic dilatation and tortuosity. Heart size normal range. Elevated hemidiaphragms obscure the lung bases. Gaseous distention of stomach. Surgical clips right upper quadrant. Mild lower lobe airspace opacities. No overt pleural effusion. No pneumothorax. Multilevel degenerative changes.  IMPRESSION: Hypoaeration with hemidiaphragm elevation and lower lobe airspace opacities; atelectasis, aspiration, or infiltrate.  Tortuosity and dilatation of the thoracic aorta.  Nonspecific gaseous distention of stomach.   Electronically Signed   By: Jearld Lesch M.D.   On: 05/10/2014 09:19     EKG Interpretation   Date/Time:  Tuesday May 10 2014 23:26:02 EDT Ventricular Rate:  81 PR Interval:  224 QRS Duration: 85 QT Interval:  387 QTC Calculation: 449 R Axis:   -15 Text Interpretation:  Sinus rhythm Atrial premature complex Prolonged PR  interval Abnormal R-wave progression, early transition Left ventricular  hypertrophy Sinus rhythm Premature atrial complexes Artifact Left  ventricular hypertrophy Abnormal ekg Confirmed by Gerhard Munch  MD  [phone]) on 05/11/2014 6:10:46 PM     On repeat exam the patient remains in similar condition, non-participatory. MDM   Final diagnoses:  Hyperkalemia  Leukocytosis  Altered mental status, unspecified altered mental status  type   patient presents from nursing facility with concerns of altered mental status.  Given the patient's acute stroke, his baseline aphasia, this seems difficult to ascertain.  However, the patient was non-ambulatory throughout, and overtly had evidence of infection.  Patient had leukocytosis, and mild fever, but no evidence of pneumonia, and bacteria in his urine. Patient did have hyperkalemia, and given the concern for altered mental status, the aforementioned complaints, he was admitted for further evaluation and management  I personally performed the services described in this documentation, which was scribed in my presence. The recorded information has been reviewed and is accurate.     Gerhard Munch, MD 05/11/14 [phone]

## 2014-05-10 NOTE — H&P (Signed)
PCP:   Terald SleeperOBSON,MICHAEL GAVIN, MD   Chief Complaint:  confusion  HPI: 73 yo male h/o cva s/p tpa converted to hemorrhage with resulting aphasia and rt sided hemiparesis at his baseline comes in with report of ams by [facility] today.  No other report from snf except AMS.  Found to have fever here and uti.  He has had no n/v/d while in the ED.  He responds to painful stimuli, but not to voice , unsure what his baseline is.  He cannot answer questions.  Has [facility] DNR paperwork with him.  Review of Systems:  Unobtainable from pt  Past Medical History: Past Medical History  Diagnosis Date  . Diabetes mellitus   . Hypertension   . Stroke   . Kidney disease   . Aphasia   . Hyperlipidemia   . Unspecified cerebral artery occlusion with cerebral infarction   . Chronic respiratory failure   . Pneumonia, organism unspecified   . Hemiplegia affecting dominant side, late effect of cerebrovascular disease   . Chronic kidney disease, unspecified   . Dysphagia, oropharyngeal phase   . Aphasia, late effect of cerebrovascular disease    Past Surgical History  Procedure Laterality Date  . Stomach surgery    . Tee without cardioversion N/A 02/25/2013    Procedure: TRANSESOPHAGEAL ECHOCARDIOGRAM (TEE);  Surgeon: Lewayne BuntingBrian S Crenshaw, MD;  [facility];  Service: Cardiovascular;  Laterality: N/A;  Rosann Auerbachtrish Fawn Kirk/ja    Medications: Prior to Admission medications   Medication Sig Start Date End Date Taking? Authorizing Provider  Amino Acids-Protein Hydrolys (FEEDING SUPPLEMENT, PRO-STAT SUGAR FREE 64,) LIQD Take 30 mLs by mouth 2 (two) times daily between meals.   Yes Historical Provider, MD  amLODipine (NORVASC) 5 MG tablet Take 5 mg by mouth daily. For HTN   Yes Historical Provider, MD  atorvastatin (LIPITOR) 20 MG tablet Take 20 mg by mouth daily. For hyperlipidemia   Yes Historical Provider, MD  colchicine 0.6 MG tablet Take 0.6 mg by mouth daily.    Yes Historical Provider, MD  insulin aspart  (NOVOLOG) 100 UNIT/ML injection Inject 4 Units into the skin once.   Yes Historical Provider, MD  insulin glargine (LANTUS) 100 UNIT/ML injection Inject 0.05 mLs (5 Units total) into the skin at bedtime. 12/16/13  Yes Standley Brookinganiel P Goodrich, MD  magnesium hydroxide (MILK OF MAGNESIA) 400 MG/5ML suspension Take 30 mLs by mouth daily as needed for mild constipation.   Yes Historical Provider, MD  moxifloxacin (AVELOX) 400 MG tablet Take 400 mg by mouth daily at 8 pm. Course to start on 05/10/2014 and [facility]2014 [facility]   Yes Historical Provider, MD  sertraline (ZOLOFT) 50 MG tablet Take 1 tablet (50 mg total) by mouth daily. 11/23/13  Yes Maxwell CaulMichael G Robson, MD  traMADol (ULTRAM) 50 MG tablet Take 50 mg by mouth every morning. Take one tablet every morning and every 6 hours when necessary. Hold for sedation or increased confusion 12/21/13  Yes Kimber RelicArthur G Green, MD  acetaminophen (TYLENOL) 325 MG tablet Take 650 mg by mouth every 4 (four) hours as needed. Pain/fever    Historical Provider, MD  ipratropium-albuterol (DUONEB) 0.5-2.5 (3) MG/3ML SOLN Take 3 mLs by nebulization every 6 (six) hours.    Historical Provider, MD    Allergies:  No Known Allergies  Social History:  reports that he has never smoked. He does not have any smokeless tobacco history on file. He reports that he does not drink alcohol or use illicit drugs.  Family History: History reviewed. No pertinent family history.  Physical Exam: Filed Vitals:   05/10/14 2124 05/10/14 2130 05/10/14 2200 05/10/14 2230  BP:  144/70 152/64 148/66  Pulse:    84  Temp:      TempSrc:      Resp:  18 20 18   SpO2: 98%   100%   General appearance: alert, cachectic, no distress and slowed mentation Head: Normocephalic, without obvious abnormality, atraumatic Eyes: negative Nose: Nares normal. Septum midline. Mucosa normal. No drainage or sinus tenderness. Neck: no JVD and supple, symmetrical, trachea midline Lungs: clear to auscultation  bilaterally Heart: regular rate and rhythm, S1, S2 normal, no murmur, click, rub or gallop Abdomen: soft, non-tender; bowel sounds normal; no masses,  no organomegaly Extremities: extremities normal, atraumatic, no cyanosis or edema Pulses: 2+ and symmetric Skin: Skin color, texture, turgor normal. No rashes or lesions Neurologic: contracted rt sided hemiparesis  Labs on Admission:   Recent Labs  05/10/14 2124  NA 145  K 5.4*  CL 110  CO2 24  GLUCOSE 493*  BUN 53*  CREATININE 2.38*  CALCIUM 8.5    Recent Labs  05/10/14 2124  AST 25  ALT 32  ALKPHOS 82  BILITOT 0.2*  PROT 6.9  ALBUMIN 2.1*    Recent Labs  05/10/14 2124  WBC 15.4*  NEUTROABS 12.9*  HGB 9.8*  HCT 29.3*  MCV 87.5  PLT 356    Radiological Exams on Admission: Dg Chest 1 View  05/10/2014   CLINICAL DATA:  Cough, weakness.  EXAM: CHEST - 1 VIEW  COMPARISON:  12/14/1948  FINDINGS: Aortic dilatation and tortuosity. Heart size normal range. Elevated hemidiaphragms obscure the lung bases. Gaseous distention of stomach. Surgical clips right upper quadrant. Mild lower lobe airspace opacities. No overt pleural effusion. No pneumothorax. Multilevel degenerative changes.  IMPRESSION: Hypoaeration with hemidiaphragm elevation and lower lobe airspace opacities; atelectasis, aspiration, or infiltrate.  Tortuosity and dilatation of the thoracic aorta.  Nonspecific gaseous distention of stomach.   Electronically Signed   By: Jearld Lesch M.D.   On: 05/10/2014 09:19   Ct Head Wo Contrast  05/10/2014   CLINICAL DATA:  Altered mental status. Unresponsive but appears awake. Diaphoresis and fever.  EXAM: CT HEAD WITHOUT CONTRAST  TECHNIQUE: Contiguous axial images were obtained from the base of the skull through the vertex without intravenous contrast.  COMPARISON:  MRI brain 02/22/2013.  CT head 02/22/2013.  FINDINGS: Since the previous study, the extensive intraparenchymal and intraventricular hemorrhage has resolved.  There is now diffuse cerebral atrophy. Prominent ventricular dilatation. Low-attenuation changes in the posterior deep white matter with extension to the cortical surface in the occipital regions bilaterally, right frontal, and left parietal regions. Old encephalomalacia also demonstrated in the left cerebellum. These changes corresponding areas of abnormality on the prior study. No mass effect or midline shift today. No abnormal extra-axial fluid collections. Basal cisterns are not effaced. No evidence of acute intracranial hemorrhage. No depressed skull fractures. Mucosal thickening and retention cysts in the paranasal sinuses.  IMPRESSION: No acute intracranial abnormalities. Old infarcts and encephalomalacia in the cerebral hemispheres bilaterally and in the left cerebellum. Ventricular dilatation bilaterally. Chronic atrophy.   Electronically Signed   By: Burman Nieves M.D.   On: 05/10/2014 23:06   Dg Chest Port 1 View  05/10/2014   CLINICAL DATA:  Altered mental status. Patient is unresponsive of awake.  EXAM: PORTABLE CHEST - 1 VIEW  COMPARISON:  05/10/2014  FINDINGS: Shallow inspiration. Heart size and pulmonary  vascularity are normal for technique. No focal airspace disease or consolidation in the lungs. No blunting of costophrenic angles. No pneumothorax.  IMPRESSION: Shallow inspiration.  No evidence of active pulmonary disease.   Electronically Signed   By: Burman Nieves M.D.   On: 05/10/2014 21:52    Assessment/Plan  73 yo male with debilitating recent cva comes in with ams probably from uti  Principal Problem:   UTI (lower urinary tract infection)-  Place on iv rocephin.  cx pending  Active Problems:   Cerebral parenchymal hemorrhage, post tpa   Essential hypertension, benign   DM type 2 (diabetes mellitus, type 2)-  ssi   Leukocytosis, unspecified   Hyperkalemia- asked to admit for his mild hyperkalemia, will provide ivf overnight and repeat in am.  Cr at his baseline.    Aphasia as late effect of cerebrovascular accident   Hemiparesis affecting right side as late effect of stroke  obs on med bed.  DNR confirmed by paperwork from [facility].  Geovanie Winnett A 05/10/2014, 11:34 PM

## 2014-05-11 ENCOUNTER — Encounter (HOSPITAL_COMMUNITY): Payer: Self-pay | Admitting: *Deleted

## 2014-05-11 LAB — CBC
HCT: 29.3 % — ABNORMAL LOW (ref 39.0–52.0)
Hemoglobin: 9.7 g/dL — ABNORMAL LOW (ref 13.0–17.0)
MCH: 29 pg (ref 26.0–34.0)
MCHC: 33.1 g/dL (ref 30.0–36.0)
MCV: 87.7 fL (ref 78.0–100.0)
PLATELETS: 339 10*3/uL (ref 150–400)
RBC: 3.34 MIL/uL — ABNORMAL LOW (ref 4.22–5.81)
RDW: 13.1 % (ref 11.5–15.5)
WBC: 16.8 10*3/uL — ABNORMAL HIGH (ref 4.0–10.5)

## 2014-05-11 LAB — GLUCOSE, CAPILLARY
GLUCOSE-CAPILLARY: 214 mg/dL — AB (ref 70–99)
Glucose-Capillary: 437 mg/dL — ABNORMAL HIGH (ref 70–99)
Glucose-Capillary: 143 mg/dL — ABNORMAL HIGH (ref 70–99)
Glucose-Capillary: 117 mg/dL — ABNORMAL HIGH (ref 70–99)
Glucose-Capillary: 109 mg/dL — ABNORMAL HIGH (ref 70–99)
Glucose-Capillary: 149 mg/dL — ABNORMAL HIGH (ref 70–99)

## 2014-05-11 LAB — BASIC METABOLIC PANEL
Anion gap: 11 (ref 5–15)
Anion gap: 10 (ref 5–15)
BUN: 45 mg/dL — AB (ref 6–23)
BUN: 40 mg/dL — ABNORMAL HIGH (ref 6–23)
CALCIUM: 8.5 mg/dL (ref 8.4–10.5)
CO2: 23 mEq/L (ref 19–32)
CO2: 25 mEq/L (ref 19–32)
Calcium: 8.7 mg/dL (ref 8.4–10.5)
Chloride: 118 mEq/L — ABNORMAL HIGH (ref 96–112)
Chloride: 113 mEq/L — ABNORMAL HIGH (ref 96–112)
Creatinine, Ser: 2.18 mg/dL — ABNORMAL HIGH (ref 0.50–1.35)
Creatinine, Ser: 2.1 mg/dL — ABNORMAL HIGH (ref 0.50–1.35)
GFR calc Af Amer: 33 mL/min — ABNORMAL LOW (ref >90)
GFR calc Af Amer: 34 mL/min — ABNORMAL LOW (ref >90)
GFR calc non Af Amer: 28 mL/min — ABNORMAL LOW (ref >90)
GFR calc non Af Amer: 29 mL/min — ABNORMAL LOW (ref >90)
GLUCOSE: 164 mg/dL — AB (ref 70–99)
Glucose, Bld: 153 mg/dL — ABNORMAL HIGH (ref 70–99)
Potassium: 4.6 mEq/L (ref 3.7–5.3)
Potassium: 5 mEq/L (ref 3.7–5.3)
SODIUM: 152 meq/L — AB (ref 137–147)
Sodium: 148 mEq/L — ABNORMAL HIGH (ref 137–147)

## 2014-05-11 LAB — MRSA PCR SCREENING: MRSA BY PCR: POSITIVE — AB

## 2014-05-11 MED ORDER — SODIUM CHLORIDE 0.9 % IV SOLN: INTRAVENOUS | Status: DC

## 2014-05-11 MED ORDER — DEXTROSE 5 % IV SOLN
1.0000 g | INTRAVENOUS | Status: DC
  Administered 2014-05-11 – 2014-05-13 (×3): 1 g via INTRAVENOUS
  Filled 2014-05-11 (×5): qty 10

## 2014-05-11 MED ORDER — ONDANSETRON HCL 4 MG/2ML IJ SOLN: 4.0000 mg | Freq: Four times a day (QID) | INTRAMUSCULAR | Status: DC | PRN

## 2014-05-11 MED ORDER — ACETAMINOPHEN 325 MG PO TABS
650.0000 mg | ORAL_TABLET | Freq: Four times a day (QID) | ORAL | Status: DC | PRN
  Administered 2014-05-14 – 2014-05-17 (×3): 650 mg via ORAL
  Filled 2014-05-11 (×4): qty 2

## 2014-05-11 MED ORDER — DEXTROSE 5 % IV SOLN
INTRAVENOUS | Status: DC
  Administered 2014-05-11 – 2014-05-12 (×3): via INTRAVENOUS

## 2014-05-11 MED ORDER — BIOTENE DRY MOUTH MT LIQD
15.0000 mL | Freq: Two times a day (BID) | OROMUCOSAL | Status: DC
  Administered 2014-05-11 – 2014-05-17 (×11): 15 mL via OROMUCOSAL

## 2014-05-11 MED ORDER — INSULIN GLARGINE 100 UNIT/ML ~~LOC~~ SOLN
5.0000 [IU] | Freq: Every day | SUBCUTANEOUS | Status: DC
  Administered 2014-05-11 – 2014-05-17 (×6): 5 [IU] via SUBCUTANEOUS
  Filled 2014-05-11 (×7): qty 0.05

## 2014-05-11 MED ORDER — ATORVASTATIN CALCIUM 20 MG PO TABS
20.0000 mg | ORAL_TABLET | Freq: Every evening | ORAL | Status: DC
  Administered 2014-05-11 – 2014-05-16 (×6): 20 mg via ORAL
  Filled 2014-05-11 (×6): qty 1

## 2014-05-11 MED ORDER — ONDANSETRON HCL 4 MG PO TABS: 4.0000 mg | ORAL_TABLET | Freq: Four times a day (QID) | ORAL | Status: DC | PRN

## 2014-05-11 MED ORDER — MUPIROCIN 2 % EX OINT
1.0000 "application " | TOPICAL_OINTMENT | Freq: Two times a day (BID) | CUTANEOUS | Status: AC
  Administered 2014-05-11 – 2014-05-16 (×10): 1 via NASAL
  Filled 2014-05-11 (×2): qty 22

## 2014-05-11 MED ORDER — INSULIN ASPART 100 UNIT/ML ~~LOC~~ SOLN
0.0000 [IU] | SUBCUTANEOUS | Status: DC
  Administered 2014-05-11: 1 [IU] via SUBCUTANEOUS
  Administered 2014-05-11 (×2): 3 [IU] via SUBCUTANEOUS
  Administered 2014-05-11: 1 [IU] via SUBCUTANEOUS
  Administered 2014-05-12: 7 [IU] via SUBCUTANEOUS
  Administered 2014-05-12: 2 [IU] via SUBCUTANEOUS
  Administered 2014-05-12: 3 [IU] via SUBCUTANEOUS
  Administered 2014-05-12: 2 [IU] via SUBCUTANEOUS

## 2014-05-11 MED ORDER — AMLODIPINE BESYLATE 5 MG PO TABS
5.0000 mg | ORAL_TABLET | Freq: Every day | ORAL | Status: DC
  Administered 2014-05-11 – 2014-05-17 (×7): 5 mg via ORAL
  Filled 2014-05-11 (×8): qty 1

## 2014-05-11 MED ORDER — CHLORHEXIDINE GLUCONATE CLOTH 2 % EX PADS
6.0000 | MEDICATED_PAD | Freq: Every day | CUTANEOUS | Status: AC
  Administered 2014-05-11 – 2014-05-15 (×3): 6 via TOPICAL

## 2014-05-11 MED ORDER — ACETAMINOPHEN 650 MG RE SUPP: 650.0000 mg | Freq: Four times a day (QID) | RECTAL | Status: DC | PRN

## 2014-05-11 MED ORDER — SERTRALINE HCL 50 MG PO TABS
50.0000 mg | ORAL_TABLET | Freq: Every day | ORAL | Status: DC
  Administered 2014-05-11 – 2014-05-17 (×7): 50 mg via ORAL
  Filled 2014-05-11 (×8): qty 1

## 2014-05-11 NOTE — Progress Notes (Signed)
Note: This document was prepared with digital dictation and possible smart phrase technology. Any transcriptional errors that result from this process are unintentional.   Caryl AspLenward Buboltz [id number] DOB: [birth year] DOA: 05/10/2014 PCP: Terald SleeperOBSON,MICHAEL GAVIN, MD  Brief narrative: 73 y/o ? 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  At baseline HEENT she rates diet, transfers from wheelchair to bed, and is total care and has been this way since last significant stroke  Past medical history-As per Problem list Chart reviewed as below- Reviewed  Consultants:  None  Procedures:  None  Antibiotics:  Rocephin 7/28   Subjective  Alert, still confused according to son and not at baseline No nausea no vomiting no chest pain reported by family Wife states that he has no coughing when she feeds him He tolerated minimal diet today No overnight fever   Objective    Interim History:   Telemetry: None   Objective: Filed Vitals:   05/10/14 2330 05/11/14 0000 05/11/14 0058 05/11/14 0601  BP: 138/76 142/67 168/77 171/71  Pulse:   87 65  Temp:   98.3 F (36.8 C) 98.1 F (36.7 C)  TempSrc:   Oral Oral  Resp: 16 17 20 20   Weight:    67.1 kg (147 lb 14.9 oz)  SpO2:   100% 100%    Intake/Output Summary (Last 24 hours) at 05/11/14 1254 Last data filed at 05/10/14 2158  Gross per 24 hour  Intake      0 ml  Output    600 ml  Net   -600 ml    Exam:  General: Alert not really verbal at baseline and similar now Cardiovascular: S1-S2 no murmur rub or gallop Respiratory: Clinically clear Abdomen: Soft nontender Skin no decubiti noted at present Neuro at baseline bedbound nonverbal state. Flexure contractures noted bilaterally reflexes are risk  Data  Reviewed: Basic Metabolic Panel:  Recent Labs Lab 05/10/14 2124 05/11/14 0548  NA 145 152*  K 5.4* 4.6  CL 110 118*  CO2 24 23  GLUCOSE 493* 164*  BUN 53* 45*  CREATININE 2.38* 2.18*  CALCIUM 8.5 8.5   Liver Function Tests:  Recent Labs Lab 05/10/14 2124  AST 25  ALT 32  ALKPHOS 82  BILITOT 0.2*  PROT 6.9  ALBUMIN 2.1*   No results found for this basename: LIPASE, AMYLASE,  in the last 168 hours No results found for this basename: AMMONIA,  in the last 168 hours CBC:  Recent Labs Lab 05/10/14 2124 05/11/14 0548  WBC 15.4* 16.8*  NEUTROABS 12.9*  --   HGB 9.8* 9.7*  HCT 29.3* 29.3*  MCV 87.5 87.7  PLT 356 339   Cardiac Enzymes: No results found for this basename: CKTOTAL, CKMB, CKMBINDEX, TROPONINI,  in the last 168 hours BNP: No components found with this basename: POCBNP,  CBG:  Recent Labs Lab 05/10/14 2022 05/11/14 0214 05/11/14 0544 05/11/14 0737 05/11/14 1108  GLUCAP 437* 214* 143* 117* 109*    Recent Results (from the past 240 hour(s))  CULTURE, BLOOD (ROUTINE X 2)     Status: None   Collection Time    05/10/14  9:24 PM      Result Value Ref Range Status   Specimen Description BLOOD LEFT FOREARM   Final   Special Requests BOTTLES DRAWN AEROBIC ONLY 4CC  Final   Culture NO GROWTH 1 DAY   Final   Report Status PENDING   Incomplete  CULTURE, BLOOD (ROUTINE X 2)     Status: None   Collection Time    05/10/14  9:24 PM      Result Value Ref Range Status   Specimen Description BLOOD RIGHT FOREARM   Final   Special Requests     Final   Value: BOTTLES DRAWN AEROBIC AND ANAEROBIC AEB 4CC ANA 6CC   Culture NO GROWTH 1 DAY   Final   Report Status PENDING   Incomplete  MRSA PCR SCREENING     Status: Abnormal   Collection Time    05/11/14  2:00 AM      Result Value Ref Range Status   MRSA by PCR POSITIVE (*) NEGATIVE Final   Comment:            The GeneXpert MRSA Assay (FDA     approved for NASAL specimens     only), is one component of a      comprehensive MRSA colonization     surveillance program. It is not     intended to diagnose MRSA     infection nor to guide or     monitor treatment for     MRSA infections.     RESULT CALLED TO, READ BACK BY AND VERIFIED WITH:     GAVIN B AT 0424 ON [id number] BY FORSYTH K     Studies:              All Imaging reviewed and is as per above notation   Scheduled Meds: . antiseptic oral rinse  15 mL Mouth Rinse BID  . cefTRIAXone (ROCEPHIN)  IV  1 g Intravenous Q24H  . Chlorhexidine Gluconate Cloth  6 each Topical Q0600  . insulin aspart  0-9 Units Subcutaneous 6 times per day  . mupirocin ointment  1 application Nasal BID   Continuous Infusions: . dextrose 50 mL/hr at 05/11/14 0834     Assessment/Plan: 1. Toxic metabolic encephalopathy secondary to sepsis from pyelonephritis vs. Hypernatremia-monitor. See below was being treated apparently at nursing home with moxifloxacin 400 daily from 7/28 2. Sepsis-pyelonephritis source, continue Rocephin IV, await cultures. CBC plus differential a.m. if any overt cough with feeding will ask speech therapy for input 3. Hypernatremia-likely secondary to volume losses as well as poor appetite. Continue D5 50 cc per hour. Recheck Chem-7 every 12 4. Hypertension-poorly controlled. continue amlodipine 5 mg daily 5. Diabetes mellitus-continue Lantus 5 units every afternoon and monitor a.m. CBG 6. 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

## 2014-05-11 NOTE — Progress Notes (Signed)
Utilization Review Completed.Brady Frazier T7/29/2015  

## 2014-05-11 NOTE — Clinical Social Work Psychosocial (Signed)
    Clinical Social Work Department BRIEF PSYCHOSOCIAL ASSESSMENT 05/11/2014  Patient:  Brady Frazier,Brady Frazier     Account Number:  [id number]     Admit date:  05/10/2014  Clinical Social Worker:  Santa GeneraUNNINGHAM,ANNE, CLINICAL SOCIAL WORKER  Date/Time:  05/11/2014 10:00 AM  Referred by:  Physician  Date Referred:  05/11/2014 Referred for  SNF Placement   Other Referral:   Interview type:  Other - See comment Other interview type:   Spoke w [facility], unable to reach wife.  Patient has aphasia.    PSYCHOSOCIAL DATA Living Status:  FACILITY Admitted from facility[facility]  [facility] Level of care:  Skilled Nursing Facility Primary support name:  Carloyn MannerBrenda Franzen Primary support relationship to patient:  SPOUSE Degree of support available:    CURRENT CONCERNS Current Concerns  Post-Acute Placement   Other Concerns:    SOCIAL WORK ASSESSMENT / PLAN CSW unable to assess patient directly, oriented to self only per medical record, unable to reach wife  by phone. CSW will continue to attempt to contact wife.  [facility], [facility].  Facility says he is long term placement, requires total assist w all ADLs.  [facility] is not able to guarantee a bed at discharge, states family has declined to pay bed hold fee and will offer bed at dc pending bed availability at the time.  CSW will continue to try to reach wife to communicate above information.  Wife will be encouraged to choose alternative SNF in case [facility] is not able to accommodate patient at discharge.   Assessment/plan status:  Psychosocial Support/Ongoing Assessment of Needs Other assessment/ plan:   Information/referral to community resources:   SNF list    PATIENT'S/FAMILY'S RESPONSE TO PLAN OF CARE: Unable to assess, will update when CSW can speak w wife.        Santa GeneraAnne Cunningham, LCSW Clinical Social Worker [phone])

## 2014-05-11 NOTE — Progress Notes (Signed)
INITIAL NUTRITION ASSESSMENT  DOCUMENTATION CODES Per approved criteria  -Non-severe (moderate) malnutrition in the context of chronic illness   INTERVENTION: Magic cup TID with meals, each supplement provides 290 kcal and 9 grams of protein   NUTRITION DIAGNOSIS: Inadequate oral intake related to altered mental status, dysphagia as evidenced by  Nursing report of pt holding food in mouth, meal intake records, hypernatremia  and unplanned wt loss.  Goal: Pt to meet >/= 90% of their estimated nutrition needs    Monitor:  Po intake, labs and wt trends   Reason for Assessment: Low Braden Score  73 y.o. male  Admitting Dx: UTI (lower urinary tract infection)  ASSESSMENT: Pt has hx of stroke, hypertension and diabetes. He has aphasia. He presented to ED with fever and UTI.  Pt has recent poor appetite and po intake. He needs assistance with meals and [facility] staff reports pt refuses feeding at times. His usual diet is Pureed with thin liquids.  Pt weight has been trending down since his stroke in May of 2014.  Total weight loss of 36#, 20% past year and 2 months.  Pt unable to participate in physical exam. Observed mild temporal, clavicle  Wasting.  Height: Ht Readings from Last 1 Encounters:  12/13/13  (1.727 m)    Weight: Wt Readings from Last 1 Encounters:  05/11/14 147 lb 14.9 oz (67.1 kg)    Ideal Body Weight: 154# (70 kg)  % Ideal Body Weight: 96%  Wt Readings from Last 10 Encounters:  05/11/14 147 lb 14.9 oz (67.1 kg)  12/13/13 150 lb 4.8 oz (68.176 kg)  02/23/13 184 lb 8.4 oz (83.7 kg)  02/23/13 184 lb 8.4 oz (83.7 kg)  10/01/12 191 lb 6.4 oz (86.818 kg)    Usual Body Weight: 140-145# past 90 days  % Usual Body Weight: 106%  BMI:  Body mass index is 22.5 kg/(m^2).normal range  Estimated Nutritional Needs: Kcal: 1900-2200 (to prevent further weight loss) Protein: 85-95 gr Fluid: 2000 ml daily  Skin: deep tissue injury to left heel  Diet Order:  Pureed diet with thin liquids  EDUCATION NEEDS: -Education not appropriate. Pt is unable to participate.   Intake/Output Summary (Last 24 hours) at 05/11/14 1159 Last data filed at 05/10/14 2158  Gross per 24 hour  Intake      0 ml  Output    600 ml  Net   -600 ml    Last BM: 05/10/14   Labs:   Recent Labs Lab 05/10/14 2124 05/11/14 0548  NA 145 152*  K 5.4* 4.6  CL 110 118*  CO2 24 23  BUN 53* 45*  CREATININE 2.38* 2.18*  CALCIUM 8.5 8.5  GLUCOSE 493* 164*    CBG (last 3)   Recent Labs  05/11/14 0544 05/11/14 0737 05/11/14 1108  GLUCAP 143* 117* 109*    Scheduled Meds: . antiseptic oral rinse  15 mL Mouth Rinse BID  . cefTRIAXone (ROCEPHIN)  IV  1 g Intravenous Q24H  . Chlorhexidine Gluconate Cloth  6 each Topical Q0600  . insulin aspart  0-9 Units Subcutaneous 6 times per day  . mupirocin ointment  1 application Nasal BID    Continuous Infusions: . dextrose 50 mL/hr at 05/11/14 4098    Past Medical History  Diagnosis Date  . Diabetes mellitus   . Hypertension   . Stroke   . Kidney disease   . Aphasia   . Hyperlipidemia   . Unspecified cerebral artery occlusion with cerebral infarction   .  Chronic respiratory failure   . Pneumonia, organism unspecified   . Hemiplegia affecting dominant side, late effect of cerebrovascular disease   . Chronic kidney disease, unspecified   . Dysphagia, oropharyngeal phase   . Aphasia, late effect of cerebrovascular disease     Past Surgical History  Procedure Laterality Date  . Stomach surgery    . Tee without cardioversion N/A 02/25/2013    Procedure: TRANSESOPHAGEAL ECHOCARDIOGRAM (TEE);  Surgeon: Lewayne Bunting, MD;  Location: [facility] ENDOSCOPY;  Service: Cardiovascular;  Laterality: N/A;  Rosann Auerbach Fawn Kirk    Royann Shivers MS,RD,CSG,LDN Office: [phone] Pager: [phone]

## 2014-05-12 DIAGNOSIS — A419 Sepsis, unspecified organism: Secondary | ICD-10-CM | POA: Diagnosis present

## 2014-05-12 DIAGNOSIS — F0150 Vascular dementia without behavioral disturbance: Secondary | ICD-10-CM | POA: Diagnosis present

## 2014-05-12 DIAGNOSIS — J9610 Chronic respiratory failure, unspecified whether with hypoxia or hypercapnia: Secondary | ICD-10-CM | POA: Diagnosis present

## 2014-05-12 DIAGNOSIS — R4182 Altered mental status, unspecified: Secondary | ICD-10-CM | POA: Diagnosis present

## 2014-05-12 DIAGNOSIS — E119 Type 2 diabetes mellitus without complications: Secondary | ICD-10-CM | POA: Diagnosis present

## 2014-05-12 DIAGNOSIS — G929 Unspecified toxic encephalopathy: Secondary | ICD-10-CM | POA: Diagnosis present

## 2014-05-12 DIAGNOSIS — E440 Moderate protein-calorie malnutrition: Secondary | ICD-10-CM | POA: Diagnosis present

## 2014-05-12 DIAGNOSIS — N183 Chronic kidney disease, stage 3 unspecified: Secondary | ICD-10-CM | POA: Diagnosis present

## 2014-05-12 DIAGNOSIS — G92 Toxic encephalopathy: Secondary | ICD-10-CM | POA: Diagnosis present

## 2014-05-12 DIAGNOSIS — N179 Acute kidney failure, unspecified: Secondary | ICD-10-CM | POA: Diagnosis present

## 2014-05-12 DIAGNOSIS — E870 Hyperosmolality and hypernatremia: Secondary | ICD-10-CM | POA: Diagnosis present

## 2014-05-12 DIAGNOSIS — Z66 Do not resuscitate: Secondary | ICD-10-CM | POA: Diagnosis present

## 2014-05-12 DIAGNOSIS — Z794 Long term (current) use of insulin: Secondary | ICD-10-CM | POA: Diagnosis not present

## 2014-05-12 DIAGNOSIS — I69920 Aphasia following unspecified cerebrovascular disease: Secondary | ICD-10-CM | POA: Diagnosis not present

## 2014-05-12 DIAGNOSIS — E785 Hyperlipidemia, unspecified: Secondary | ICD-10-CM | POA: Diagnosis present

## 2014-05-12 DIAGNOSIS — I129 Hypertensive chronic kidney disease with stage 1 through stage 4 chronic kidney disease, or unspecified chronic kidney disease: Secondary | ICD-10-CM | POA: Diagnosis present

## 2014-05-12 DIAGNOSIS — E875 Hyperkalemia: Secondary | ICD-10-CM | POA: Diagnosis present

## 2014-05-12 DIAGNOSIS — I69959 Hemiplegia and hemiparesis following unspecified cerebrovascular disease affecting unspecified side: Secondary | ICD-10-CM | POA: Diagnosis not present

## 2014-05-12 DIAGNOSIS — N10 Acute tubulo-interstitial nephritis: Secondary | ICD-10-CM | POA: Diagnosis present

## 2014-05-12 DIAGNOSIS — G9341 Metabolic encephalopathy: Secondary | ICD-10-CM | POA: Diagnosis present

## 2014-05-12 LAB — GLUCOSE, CAPILLARY
GLUCOSE-CAPILLARY: 225 mg/dL — AB (ref 70–99)
Glucose-Capillary: 230 mg/dL — ABNORMAL HIGH (ref 70–99)
Glucose-Capillary: 184 mg/dL — ABNORMAL HIGH (ref 70–99)
Glucose-Capillary: 160 mg/dL — ABNORMAL HIGH (ref 70–99)
Glucose-Capillary: 201 mg/dL — ABNORMAL HIGH (ref 70–99)
Glucose-Capillary: 317 mg/dL — ABNORMAL HIGH (ref 70–99)
Glucose-Capillary: 237 mg/dL — ABNORMAL HIGH (ref 70–99)

## 2014-05-12 LAB — COMPREHENSIVE METABOLIC PANEL
ALBUMIN: 2 g/dL — AB (ref 3.5–5.2)
ALT: 31 U/L (ref 0–53)
ANION GAP: 8 (ref 5–15)
AST: 32 U/L (ref 0–37)
Alkaline Phosphatase: 74 U/L (ref 39–117)
BILIRUBIN TOTAL: 0.2 mg/dL — AB (ref 0.3–1.2)
BUN: 39 mg/dL — AB (ref 6–23)
CHLORIDE: 114 meq/L — AB (ref 96–112)
CO2: 24 mEq/L (ref 19–32)
CREATININE: 2.06 mg/dL — AB (ref 0.50–1.35)
Calcium: 8.5 mg/dL (ref 8.4–10.5)
GFR calc non Af Amer: 30 mL/min — ABNORMAL LOW (ref >90)
GFR, EST AFRICAN AMERICAN: 35 mL/min — AB (ref >90)
GLUCOSE: 196 mg/dL — AB (ref 70–99)
Potassium: 4.6 mEq/L (ref 3.7–5.3)
Sodium: 146 mEq/L (ref 137–147)
Total Protein: 6.4 g/dL (ref 6.0–8.3)

## 2014-05-12 LAB — CBC WITH DIFFERENTIAL/PLATELET
BASOS PCT: 0 % (ref 0–1)
Basophils Absolute: 0 10*3/uL (ref 0.0–0.1)
Eosinophils Absolute: 0.2 10*3/uL (ref 0.0–0.7)
Eosinophils Relative: 1 % (ref 0–5)
HEMATOCRIT: 28.9 % — AB (ref 39.0–52.0)
Hemoglobin: 9.9 g/dL — ABNORMAL LOW (ref 13.0–17.0)
Lymphocytes Relative: 8 % — ABNORMAL LOW (ref 12–46)
Lymphs Abs: 1.2 10*3/uL (ref 0.7–4.0)
MCH: 29.6 pg (ref 26.0–34.0)
MCHC: 34.3 g/dL (ref 30.0–36.0)
MCV: 86.3 fL (ref 78.0–100.0)
MONO ABS: 1.3 10*3/uL — AB (ref 0.1–1.0)
MONOS PCT: 10 % (ref 3–12)
Neutro Abs: 11.4 10*3/uL — ABNORMAL HIGH (ref 1.7–7.7)
Neutrophils Relative %: 81 % — ABNORMAL HIGH (ref 43–77)
Platelets: 355 10*3/uL (ref 150–400)
RBC: 3.35 MIL/uL — ABNORMAL LOW (ref 4.22–5.81)
RDW: 13 % (ref 11.5–15.5)
WBC: 14.2 10*3/uL — ABNORMAL HIGH (ref 4.0–10.5)

## 2014-05-12 LAB — URINE CULTURE
CULTURE: NO GROWTH
Colony Count: NO GROWTH

## 2014-05-12 LAB — BASIC METABOLIC PANEL
ANION GAP: 10 (ref 5–15)
BUN: 39 mg/dL — ABNORMAL HIGH (ref 6–23)
CO2: 21 meq/L (ref 19–32)
Calcium: 8.1 mg/dL — ABNORMAL LOW (ref 8.4–10.5)
Chloride: 109 mEq/L (ref 96–112)
Creatinine, Ser: 1.97 mg/dL — ABNORMAL HIGH (ref 0.50–1.35)
GFR calc non Af Amer: 32 mL/min — ABNORMAL LOW (ref >90)
GFR, EST AFRICAN AMERICAN: 37 mL/min — AB (ref >90)
Glucose, Bld: 349 mg/dL — ABNORMAL HIGH (ref 70–99)
Potassium: 4.7 mEq/L (ref 3.7–5.3)
Sodium: 140 mEq/L (ref 137–147)

## 2014-05-12 MED ORDER — INSULIN ASPART 100 UNIT/ML ~~LOC~~ SOLN
2.0000 [IU] | Freq: Once | SUBCUTANEOUS | Status: AC
  Administered 2014-05-12: 2 [IU] via SUBCUTANEOUS

## 2014-05-12 MED ORDER — INSULIN ASPART 100 UNIT/ML ~~LOC~~ SOLN
0.0000 [IU] | Freq: Three times a day (TID) | SUBCUTANEOUS | Status: DC
  Administered 2014-05-13: 3 [IU] via SUBCUTANEOUS
  Administered 2014-05-13: 2 [IU] via SUBCUTANEOUS

## 2014-05-12 NOTE — Progress Notes (Signed)
Note: This document was prepared with digital dictation and possible smart phrase technology. Any transcriptional errors that result from this process are unintentional.   Brady Frazier ZOX:[id number] DOB: July 18, 1941 DOA: 05/10/2014 PCP: Terald Sleeper, MD  Brief narrative: 73 y/o ? 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  At baseline puree iet, transfers from wheelchair to bed, and is total care and has been this way since last significant stroke  Past medical history-As per Problem list Chart reviewed as below- Reviewed  Consultants:  None  Procedures:  None  Antibiotics:  Rocephin 7/28   Subjective  Alert, less confused eati9ng good acc to wife atr bedsdie. Still not compeltely responsive  Objective    Interim History:   Telemetry: None   Objective: Filed Vitals:   05/11/14 0058 05/11/14 0601 05/11/14 1446 05/12/14 0500  BP: 168/77 171/71 137/63 139/55  Pulse: 87 65 73 72  Temp: 98.3 F (36.8 C) 98.1 F (36.7 C) 98.1 F (36.7 C) 98.8 F (37.1 C)  TempSrc: Oral Oral Oral Oral  Resp: 20 20 20 20   Weight:  67.1 kg (147 lb 14.9 oz)    SpO2: 100% 100% 100% 100%    Intake/Output Summary (Last 24 hours) at 05/12/14 1232 Last data filed at 05/12/14 0930  Gross per 24 hour  Intake    940 ml  Output   1200 ml  Net   -260 ml    Exam:  General: Alert not really verbal at baseline and similar now Cardiovascular: S1-S2 no murmur rub or gallop Respiratory: Clinically clear Abdomen: Soft nontender Neuro at baseline bedbound nonverbal state. Flexure contractures noted bilaterally reflexes are risk Foot wound on RLE non-specific and not looking infected  Data Reviewed: Basic Metabolic Panel:  Recent Labs Lab 05/10/14 2124  05/11/14 0548 05/11/14 1654 05/12/14 0513  NA 145 152* 148* 146  K 5.4* 4.6 5.0 4.6  CL 110 118* 113* 114*  CO2 24 23 25 24   GLUCOSE 493* 164* 153* 196*  BUN 53* 45* 40* 39*  CREATININE 2.38* 2.18* 2.10* 2.06*  CALCIUM 8.5 8.5 8.7 8.5   Liver Function Tests:  Recent Labs Lab 05/10/14 2124 05/12/14 0513  AST 25 32  ALT 32 31  ALKPHOS 82 74  BILITOT 0.2* 0.2*  PROT 6.9 6.4  ALBUMIN 2.1* 2.0*   No results found for this basename: LIPASE, AMYLASE,  in the last 168 hours No results found for this basename: AMMONIA,  in the last 168 hours CBC:  Recent Labs Lab 05/10/14 2124 05/11/14 0548 05/12/14 0513  WBC 15.4* 16.8* 14.2*  NEUTROABS 12.9*  --  11.4*  HGB 9.8* 9.7* 9.9*  HCT 29.3* 29.3* 28.9*  MCV 87.5 87.7 86.3  PLT 356 339 355   Cardiac Enzymes: No results found for this basename: CKTOTAL, CKMB, CKMBINDEX, TROPONINI,  in the last 168 hours BNP: No components found with this basename: POCBNP,  CBG:  Recent Labs Lab 05/11/14 2039 05/11/14 2357 05/12/14 0354 05/12/14 0725 05/12/14 1136  GLUCAP 230* 225* 184* 160* 201*    Recent Results (from the past 240 hour(s))  CULTURE, BLOOD (ROUTINE X 2)     Status: None   Collection Time    05/10/14  9:24 PM      Result Value Ref Range Status   Specimen Description BLOOD LEFT FOREARM  Final   Special Requests BOTTLES DRAWN AEROBIC ONLY 4CC   Final   Culture NO GROWTH 1 DAY   Final   Report Status PENDING   Incomplete  CULTURE, BLOOD (ROUTINE X 2)     Status: None   Collection Time    05/10/14  9:24 PM      Result Value Ref Range Status   Specimen Description BLOOD RIGHT FOREARM   Final   Special Requests     Final   Value: BOTTLES DRAWN AEROBIC AND ANAEROBIC AEB 4CC ANA 6CC   Culture NO GROWTH 1 DAY   Final   Report Status PENDING   Incomplete  MRSA PCR SCREENING     Status: Abnormal   Collection Time    05/11/14  2:00 AM      Result Value Ref Range Status   MRSA by PCR POSITIVE (*) NEGATIVE Final    Comment:            The GeneXpert MRSA Assay (FDA     approved for NASAL specimens     only), is one component of a     comprehensive MRSA colonization     surveillance program. It is not     intended to diagnose MRSA     infection nor to guide or     monitor treatment for     MRSA infections.     RESULT CALLED TO, READ BACK BY AND VERIFIED WITH:     GAVIN B AT 0424 ON [id number] BY FORSYTH K     Studies:              All Imaging reviewed and is as per above notation   Scheduled Meds: . amLODipine  5 mg Oral Daily  . antiseptic oral rinse  15 mL Mouth Rinse BID  . atorvastatin  20 mg Oral QPM  . cefTRIAXone (ROCEPHIN)  IV  1 g Intravenous Q24H  . Chlorhexidine Gluconate Cloth  6 each Topical Q0600  . insulin aspart  0-9 Units Subcutaneous 6 times per day  . insulin glargine  5 Units Subcutaneous QHS  . mupirocin ointment  1 application Nasal BID  . sertraline  50 mg Oral Daily   Continuous Infusions: . dextrose 50 mL/hr at 05/12/14 0525     Assessment/Plan:  1. Toxic metabolic encephalopathy secondary to sepsis from pyelonephritis vs. Hypernatremia-monitor. See below was being treated apparently at nursing home with moxifloxacin 400 daily from 7/28 2. Sepsis-has Foot wound but pyelonephritis probable source, continue Rocephin IV, await cultures still Pending. CBC plus differential a.m. Ate full plate today by wife-no overt concerns 3. Hyperkalemia-unclear etiology-recheck  Chem 7 am. 4. Hypernatremia-On admit 152--likely secondary to volume losses as well as poor appetite. Continue D5 50 cc per hour.  bmet q24-Sodium now 146 5. Hypertension-poorly controlled. continue amlodipine 5 mg daily 6. Diabetes mellitus-continue Lantus 5 units every afternoon and monitor a.m. CBG 7. 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

## 2014-05-12 NOTE — Clinical Social Work Note (Signed)
CSW informed son, Brady Frazier, of bed offers.  Family is strongly considering [facility] due to its proximity to [facility] [facility]  Children all live out of town, [place], [place] and [place].  SNF closer to ALF would allow wife to visit more frequently.  Per son, Arlys JohnBrian admissions/[name] is "working on all the paperwork."  [name], LCSW Clinical Social Worker [phone])

## 2014-05-12 NOTE — Clinical Social Work Note (Signed)
CSW spoke w wife at bedside, [facility], [facility].  Explained that SNF may require bed hold fee in order to hold patient's bed [facility], if bed hold is not paid, facility says they can accept patient pending bed availability on day of discharge.  Discussed options for alternative SNF placement including facilities in [place] and [place], gave wife SNF list.  Wife asked that CSW speak w son, Francis Dowseim Rehfeldt [phone]) to explain process.  CSW spoke w son by phone, he states he will call [facility] to discuss bed hold process and let CSW know if family wants bed search conducted in [place] for patient.  Santa GeneraAnne Cunningham, LCSW Clinical Social Worker [phone])

## 2014-05-13 DIAGNOSIS — J189 Pneumonia, unspecified organism: Secondary | ICD-10-CM

## 2014-05-13 DIAGNOSIS — N179 Acute kidney failure, unspecified: Secondary | ICD-10-CM

## 2014-05-13 DIAGNOSIS — A419 Sepsis, unspecified organism: Principal | ICD-10-CM

## 2014-05-13 DIAGNOSIS — N189 Chronic kidney disease, unspecified: Secondary | ICD-10-CM

## 2014-05-13 DIAGNOSIS — N10 Acute tubulo-interstitial nephritis: Secondary | ICD-10-CM

## 2014-05-13 DIAGNOSIS — G9341 Metabolic encephalopathy: Secondary | ICD-10-CM

## 2014-05-13 DIAGNOSIS — R6520 Severe sepsis without septic shock: Secondary | ICD-10-CM

## 2014-05-13 DIAGNOSIS — E440 Moderate protein-calorie malnutrition: Secondary | ICD-10-CM

## 2014-05-13 LAB — GLUCOSE, CAPILLARY
GLUCOSE-CAPILLARY: 234 mg/dL — AB (ref 70–99)
GLUCOSE-CAPILLARY: 242 mg/dL — AB (ref 70–99)
Glucose-Capillary: 158 mg/dL — ABNORMAL HIGH (ref 70–99)
Glucose-Capillary: 348 mg/dL — ABNORMAL HIGH (ref 70–99)
Glucose-Capillary: 333 mg/dL — ABNORMAL HIGH (ref 70–99)

## 2014-05-13 LAB — BASIC METABOLIC PANEL
ANION GAP: 11 (ref 5–15)
Anion gap: 10 (ref 5–15)
BUN: 34 mg/dL — AB (ref 6–23)
BUN: 35 mg/dL — AB (ref 6–23)
CALCIUM: 8.4 mg/dL (ref 8.4–10.5)
CHLORIDE: 103 meq/L (ref 96–112)
CO2: 24 meq/L (ref 19–32)
CO2: 22 meq/L (ref 19–32)
Calcium: 8.1 mg/dL — ABNORMAL LOW (ref 8.4–10.5)
Chloride: 109 mEq/L (ref 96–112)
Creatinine, Ser: 1.9 mg/dL — ABNORMAL HIGH (ref 0.50–1.35)
Creatinine, Ser: 1.9 mg/dL — ABNORMAL HIGH (ref 0.50–1.35)
GFR calc Af Amer: 38 mL/min — ABNORMAL LOW (ref >90)
GFR calc Af Amer: 38 mL/min — ABNORMAL LOW (ref >90)
GFR calc non Af Amer: 33 mL/min — ABNORMAL LOW (ref >90)
GFR calc non Af Amer: 33 mL/min — ABNORMAL LOW (ref >90)
GLUCOSE: 172 mg/dL — AB (ref 70–99)
Glucose, Bld: 354 mg/dL — ABNORMAL HIGH (ref 70–99)
Potassium: 5.2 mEq/L (ref 3.7–5.3)
Potassium: 4.8 mEq/L (ref 3.7–5.3)
Sodium: 143 mEq/L (ref 137–147)
Sodium: 136 mEq/L — ABNORMAL LOW (ref 137–147)

## 2014-05-13 MED ORDER — INSULIN ASPART 100 UNIT/ML ~~LOC~~ SOLN
0.0000 [IU] | Freq: Three times a day (TID) | SUBCUTANEOUS | Status: DC
  Administered 2014-05-13: 11 [IU] via SUBCUTANEOUS
  Administered 2014-05-14: 2 [IU] via SUBCUTANEOUS
  Administered 2014-05-14: 3 [IU] via SUBCUTANEOUS
  Administered 2014-05-15: 8 [IU] via SUBCUTANEOUS
  Administered 2014-05-15: 5 [IU] via SUBCUTANEOUS
  Administered 2014-05-16: 3 [IU] via SUBCUTANEOUS
  Administered 2014-05-16: 5 [IU] via SUBCUTANEOUS
  Administered 2014-05-16: 3 [IU] via SUBCUTANEOUS
  Administered 2014-05-17 (×2): 2 [IU] via SUBCUTANEOUS

## 2014-05-13 NOTE — Progress Notes (Signed)
Wife of patient experiencing frequent bowel movements. I educated her about the importance of washing her hands to prevent the possible spread of infection to the patient. Patient placed on Enteric precautions due to wife having frequent diarrhea. Social Worker notified that the [place] has no transportation [place] [facility]. Attempted to arrange transportation; wife prefers to stay with patient. Patient not having diarrhea at this time.

## 2014-05-13 NOTE — Clinical Social Work Note (Signed)
[name] has authorized SNF rehab for patient at [facility], patient can transfer when medically ready.  [name], LCSW Clinical Social Worker [phone])

## 2014-05-13 NOTE — Clinical Social Work Note (Signed)
[name], [name] admissions, [facility] necessary [facility].  Admission has been approved by insurance.  Patient can admit when medically ready.  Santa GeneraAnne Cunningham, LCSW Clinical Social Worker [phone])

## 2014-05-13 NOTE — Care Management Note (Addendum)
    Page 1 of 1   05/17/2014     9:48:37 AM CARE MANAGEMENT NOTE 05/17/2014  Patient:  Brady Frazier,Brady Frazier   Account Number:  [id number]  Date Initiated:  05/11/2014  Documentation initiated by:  Anibal HendersonBOLDEN,GENEVA  Subjective/Objective Assessment:   [facility] with  fever, UTI, hyperkalemia, AMS.     Action/Plan:   [facility], so CSW is looking for a bed at other SNFs   Anticipated DC Date:  05/13/2014   Anticipated DC Plan:  SKILLED NURSING FACILITY  In-house referral  Clinical Social Worker      DC Planning Services  CM consult      Choice offered to / List presented to:             Status of service:  In process, will continue to follow Medicare Important Message given?  YES (If response is "NO", the following Medicare IM given date fields will be blank) Date Medicare IM given:  05/13/2014 Medicare IM given by:  Kathyrn SheriffHILDRESS,JESSICA Date Additional Medicare IM given:  05/17/2014 Additional Medicare IM given by:  Sharrie RothmanAMMY C Vasilis Luhman  Discharge Disposition:  SKILLED NURSING FACILITY  Per UR Regulation:  Reviewed for med. necessity/level of care/duration of stay  If discussed at Long Length of Stay Meetings, dates discussed:    Comments:  05/17/14 0945 [name], RN BSN CM [facility]. CSW to arrange discharge to facility.  05/13/2014 1200 Patient being discharged to SNF. Discharge being arranged by CSW. Patient, pt's family and RN aware of discharge plan. No CM needs noted.

## 2014-05-13 NOTE — Evaluation (Addendum)
Physical Therapy Evaluation Patient Details Name: Savien Mamula MRN: [id number] DOB: 30-Oct-1940 Today's Date: 05/13/2014   History of Present Illness  73 yo male h/o cva s/p tpa converted to hemorrhage with resulting aphasia and rt sided hemiparesis at his baseline comes in with report of am[facility] today.  No other report from snf except AMS.  Found to have fever here and uti.  He has had no n/v/d while in the ED.  He responds to painful stimuli, but not to voice , unsure what his baseline is.  He cannot answer questions.  Has [facility] DNR paperwork with him.  Clinical Impression  Pt is a 73 year old male who presents to physical therapy with dx of UTI.   Pt is a poor historian and hx received from wife who is an ALF resident.  Pt is a LTC resident since his 3rd stroke in 2014.  Per family, pt was able to amb with RW for short distance and assist with bed mobility and transfers 3-4 months ago, however has progressed to total assist and use of hoyer lift (??) for transfers.  Per family, pt has arthritis in his hands which has limited his active participation in functional mobility skills.  Pt is currently W/C bound and is unable to self propel W/C.  During evaluation, pt required total assist for transfers and max assist for supine <-> sit transfers.  Pt with facial grimacing and moaning during movements, though unable to specify pain or location of pain.  Noted decreased knee extension (B), though family reports he is able to straighten his legs.  Recommend pt to continue with SNF for program for ROM, strengthening, and functional mobility skills; per family, pt will convert to LTC when appropriate.   No DME recommendations.      Follow Up Recommendations Other (comment) SNF for rehabilitation, then convert to LTC when appropraite    Equipment Recommendations  None recommended by PT       Precautions / Restrictions Precautions Precautions: Fall Restrictions Weight Bearing Restrictions: No       Mobility  Bed Mobility Overal bed mobility: Needs Assistance Bed Mobility: Supine to Sit;Sit to Supine;Rolling Rolling: Max assist   Supine to sit: Total assist Sit to supine: Total assist      Transfers                 General transfer comment: Per family, pt is total assist with transfers (possible use of Harrel Lemon for transfers in the past 3 months)  Ambulation/Gait             General Gait Details: Pt is non-ambulatory. Per family, pt is wheelchair bound, does not self propel W/C.        Balance Overall balance assessment: Needs assistance Sitting-balance support: Single extremity supported;Feet supported Sitting balance-Leahy Scale: Fair Sitting balance - Comments: Min assist to mod assist to maintain sitting at EOB secondary to anterior lean due to flexed posture                                     Pertinent Vitals/Pain PAINAD 3    Home Living Family/patient expects to be discharged to:: Skilled nursing facility                      Prior Function Level of Independence: Needs assistance   Gait / Transfers Assistance Needed: Per family, pt  has required total assist with bed mobility and transfers for the past 3-4 months since "arthritis" in hands increased and pt unable to use RW.  Possible use of hoyer for transfers per family.    ADL's / Homemaking Assistance Needed: Per family, pt required total assist with ADLs        Hand Dominance   Dominant Hand: Right    Extremity/Trunk Assessment               Lower Extremity Assessment: RLE deficits/detail;LLE deficits/detail RLE Deficits / Details: Noted decreased knee extension -60 degrees, unknown if this is contractures or resistance with mobility as pt was moaning during PROM testing.   Family does report pt is able to extend legs.  LLE Deficits / Details: Noted decreased knee extension -50 degrees, unknown if this is contractures or resistance with mobility as pt  was moaning during PROM testing.  Family does report pt is able to extend legs.       Communication      Cognition Arousal/Alertness: Lethargic Behavior During Therapy: WFL for tasks assessed/performed Overall Cognitive Status: History of cognitive impairments - at baseline                               Assessment/Plan    PT Assessment All further PT needs can be met in the next venue of care  PT Diagnosis Generalized weakness;Other (comment) (Muscle tightness vs. contractures)   PT Problem List Decreased range of motion;Decreased activity tolerance;Decreased mobility  PT Treatment Interventions     PT Goals (Current goals can be found in the Care Plan section) Acute Rehab PT Goals PT Goal Formulation: No goals set, d/c therapy    Frequency      End of Session Equipment Utilized During Treatment: Oxygen Activity Tolerance: Patient limited by pain;Other (comment) (Limited as pt did not actively participate) Patient left: in bed;with call bell/phone within reach;with family/visitor present           Time: 9485-4627 PT Time Calculation (min): 30 min   Charges:   PT Evaluation $Initial PT Evaluation Tier I: 1 Procedure      Juanita Devincent 05/13/2014, 8:57 AM  ADDENDUM : corrected hx

## 2014-05-13 NOTE — Progress Notes (Signed)
[facility] PROGRESS NOTE Interim History: 73 y/o ? 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 At baseline puree iet, transfers from wheelchair to bed, and is total care and has been this way since last significant stroke   Assessment/Plan: Encephalopathy, metabolic  - Multifactorial due to pyelonephritis and hypernatremia. - Ct head as below. - now improved. Pt recommended SNF.  Sepsis due to Pyelonephritis, acute - On IV rocephin 7.28.2015, UC and BC negative till date. - Was on Aveelox at facility, so UC will not be helpful. - Cont to improved Bp stable afebrile.  AKI on CKD stage III: - Baseline Cr. 1.9. On admission 2.7 improved with IV fluids.  Malnutrition of moderate degree - Ensure TID.  Hypernatremia/Hyperkalemia - Resolved with IV fluids.  Diabetes mellitus: - Continue Lantus 5 units every afternoon and monitor a.m.   Dementia: -continue sertraline 50 daily  Essential hypertension, benign - poorly controlled.   Code Status: DO NOT RESUSCITATE  Family Communication: Discussed with Wife at bedside  Disposition Plan: Inpatient    Consultants:  none  Procedures: CT head 7.29 : No acute intracranial abnormalities    Antibiotics: Rocephin 7.28.2015  HPI/Subjective: Pt complaining of pain all over. Mild improvement from admission.  Objective: Filed Vitals:   05/12/14 0500 05/12/14 1430 05/12/14 2157 05/13/14 0616  BP: 139/55 149/58 131/83 134/64  Pulse: 72 85 73 67  Temp: 98.8 F (37.1 C) 99.5 F (37.5 C) 98.1 F (36.7 C) 98.2 F (36.8 C)  TempSrc: Oral Axillary Axillary Axillary  Resp: 20 20 20 20   Weight:      SpO2: 100% 100% 100% 100%    Intake/Output Summary (Last 24 hours) at 05/13/14 1036 Last data filed at  05/13/14 1610  Gross per 24 hour  Intake    240 ml  Output   2001 ml  Net  -1761 ml   Filed Weights   05/11/14 0601  Weight: 67.1 kg (147 lb 14.9 oz)    Exam:  General: Alert, awake, oriented x1, in no acute distress.  HEENT: No bruits, no goiter.  Heart: Regular rate and rhythm, without murmurs, rubs, gallops.  Lungs: Good air movement, clear Abdomen: Soft, nontender, nondistended, positive bowel sounds.    Data Reviewed: Basic Metabolic Panel:  Recent Labs Lab 05/11/14 0548 05/11/14 1654 05/12/14 0513 05/12/14 1651 05/13/14 0706  NA 152* 148* 146 140 143  K 4.6 5.0 4.6 4.7 5.2  CL 118* 113* 114* 109 109  CO2 23 25 24 21 24   GLUCOSE 164* 153* 196* 349* 172*  BUN 45* 40* 39* 39* 34*  CREATININE 2.18* 2.10* 2.06* 1.97* 1.90*  CALCIUM 8.5 8.7 8.5 8.1* 8.4   Liver Function Tests:  Recent Labs Lab 05/10/14 2124 05/12/14 0513  AST 25 32  ALT 32 31  ALKPHOS 82 74  BILITOT 0.2* 0.2*  PROT 6.9 6.4  ALBUMIN 2.1* 2.0*   No results found for this basename: LIPASE, AMYLASE,  in the last 168 hours No results found for this basename: AMMONIA,  in the last 168 hours CBC:  Recent Labs Lab 05/10/14 2124 05/11/14 0548 05/12/14 0513  WBC 15.4* 16.8* 14.2*  NEUTROABS 12.9*  --  11.4*  HGB 9.8* 9.7* 9.9*  HCT 29.3* 29.3* 28.9*  MCV 87.5 87.7 86.3  PLT 356 339 355   Cardiac  Enzymes: No results found for this basename: CKTOTAL, CKMB, CKMBINDEX, TROPONINI,  in the last 168 hours BNP (last 3 results)  Recent Labs  12/12/13 2340  PROBNP 6530.0*   CBG:  Recent Labs Lab 05/12/14 0725 05/12/14 1136 05/12/14 1625 05/12/14 2110 05/13/14 0738  GLUCAP 160* 201* 317* 237* 158*    Recent Results (from the past 240 hour(s))  CULTURE, BLOOD (ROUTINE X 2)     Status: None   Collection Time    05/10/14  9:24 PM      Result Value Ref Range Status   Specimen Description BLOOD LEFT FOREARM   Final   Special Requests BOTTLES DRAWN AEROBIC ONLY 4CC   Final    Culture NO GROWTH 1 DAY   Final   Report Status PENDING   Incomplete  CULTURE, BLOOD (ROUTINE X 2)     Status: None   Collection Time    05/10/14  9:24 PM      Result Value Ref Range Status   Specimen Description BLOOD RIGHT FOREARM   Final   Special Requests     Final   Value: BOTTLES DRAWN AEROBIC AND ANAEROBIC AEB 4CC ANA 6CC   Culture NO GROWTH 1 DAY   Final   Report Status PENDING   Incomplete  URINE CULTURE     Status: None   Collection Time    05/10/14  9:56 PM      Result Value Ref Range Status   Specimen Description URINE, CATHETERIZED   Final   Special Requests NONE   Final   Culture  Setup Time     Final   Value: 05/11/2014 13:43     Performed at [facility]   Colony Count     Final   Value: NO GROWTH     [facility]formed at [facility]   Culture     Final   Value: NO GROWTH     Performed at [facility]   Report Status 05/12/2014 FINAL   Final  MRSA PCR SCREENING     Status: Abnormal   Collection Time    05/11/14  2:00 AM      Result Value Ref Range Status   MRSA by PCR POSITIVE (*) NEGATIVE Final   Comment:            The GeneXpert MRSA Assay (FDA     approved for NASAL specimens     only), is one component of a     comprehensive MRSA colonization     surveillance program. It is not     intended to diagnose MRSA     infection nor to guide or     monitor treatment for     MRSA infections.     RESULT CALLED TO, READ BACK BY AND VERIFIED WITH:     GAVIN B AT 0424 ON [id number] BY FORSYTH K     Studies: No results found.  Scheduled Meds: . amLODipine  5 mg Oral Daily  . antiseptic oral rinse  15 mL Mouth Rinse BID  . atorvastatin  20 mg Oral QPM  . cefTRIAXone (ROCEPHIN)  IV  1 g Intravenous Q24H  . Chlorhexidine Gluconate Cloth  6 each Topical Q0600  . insulin aspart  0-9 Units Subcutaneous TID WC  . insulin glargine  5 Units Subcutaneous QHS  . mupirocin ointment  1 application Nasal BID  . sertraline  50 mg Oral Daily   Continuous  Infusions: . dextrose 50 mL/hr at 05/12/14  2209     Marinda Elk  [facility] Pager [phone]. If 8PM-8AM, please contact night-coverage at [email or web address], password [facility] 05/13/2014, 10:36 AM  LOS: 3 days    **Disclaimer: This note may have been dictated with voice recognition software. Similar sounding words can inadvertently be transcribed and this note may contain transcription errors which may not have been corrected upon publication of note.**

## 2014-05-13 NOTE — Progress Notes (Signed)
M.D. notified that patient CBG was 348. M.D. Approved insulin order to be changed to moderate. Patient still receiving D5 @ 50. Will administer 11 units of insulin and continue to monitor patient. Roselie AwkwardGrissom,Ameliana Brashear A RN  4:50 PM 05/13/2014

## 2014-05-14 LAB — GLUCOSE, CAPILLARY
GLUCOSE-CAPILLARY: 158 mg/dL — AB (ref 70–99)
GLUCOSE-CAPILLARY: 123 mg/dL — AB (ref 70–99)
Glucose-Capillary: 137 mg/dL — ABNORMAL HIGH (ref 70–99)
Glucose-Capillary: 91 mg/dL (ref 70–99)

## 2014-05-14 LAB — BASIC METABOLIC PANEL
Anion gap: 9 (ref 5–15)
BUN: 32 mg/dL — AB (ref 6–23)
CO2: 23 mEq/L (ref 19–32)
Calcium: 8.2 mg/dL — ABNORMAL LOW (ref 8.4–10.5)
Chloride: 107 mEq/L (ref 96–112)
Creatinine, Ser: 1.84 mg/dL — ABNORMAL HIGH (ref 0.50–1.35)
GFR calc Af Amer: 40 mL/min — ABNORMAL LOW (ref >90)
GFR calc non Af Amer: 34 mL/min — ABNORMAL LOW (ref >90)
GLUCOSE: 170 mg/dL — AB (ref 70–99)
POTASSIUM: 4.5 meq/L (ref 3.7–5.3)
Sodium: 139 mEq/L (ref 137–147)

## 2014-05-14 MED ORDER — LEVOFLOXACIN 750 MG PO TABS
750.0000 mg | ORAL_TABLET | Freq: Every day | ORAL | Status: DC
  Administered 2014-05-14 – 2014-05-16 (×3): 750 mg via ORAL
  Filled 2014-05-14 (×3): qty 1

## 2014-05-14 MED ORDER — SULFAMETHOXAZOLE-TMP DS 800-160 MG PO TABS: 1.0000 | ORAL_TABLET | Freq: Two times a day (BID) | ORAL | Status: DC

## 2014-05-14 NOTE — Progress Notes (Signed)
[facility] PROGRESS NOTE Interim History: 73 y/o ? 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 At baseline puree iet, transfers from wheelchair to bed, and is total care and has been this way since last significant stroke   Assessment/Plan: Encephalopathy, metabolic  - Multifactorial due to pyelonephritis and hypernatremia. - Ct head as below. - now improved. Pt recommended SNF.  Sepsis due to Pyelonephritis, acute - On IV rocephin 7.28.2015, UC and BC negative till date. Changed to levaquin - Was on Aveelox at facility, so UC will not be helpful. - Cont to improved Bp stable afebrile. - ? Degree of dementia  AKI on CKD stage III: - DUE TO PRE-RENAL - AT Baseline Cr. 1.9. On admission 2.7 improved with IV fluids.  Malnutrition of moderate degree - Ensure TID.  Hypernatremia/Hyperkalemia - Resolved with IV fluids.  Diabetes mellitus: - Continue Lantus 5 units every afternoon and monitor a.m.   Dementia: -continue sertraline 50 daily  Essential hypertension, benign - poorly controlled.   Code Status: DO NOT RESUSCITATE  Family Communication: Discussed with Wife at bedside  Disposition Plan: Inpatient    Consultants:  none  Procedures: CT head 7.29 : No acute intracranial abnormalities    Antibiotics: Rocephin 7.28.2015  HPI/Subjective: Pt complaining of pain all over. More confused than the day before.   Objective: Filed Vitals:   05/13/14 0616 05/13/14 1353 05/13/14 2213 05/14/14 0655  BP: 134/64 128/42 141/61 133/64  Pulse: 67 80 73 69  Temp: 98.2 F (36.8 C) 98.1 F (36.7 C) 98.5 F (36.9 C) 98.6 F (37 C)  TempSrc: Axillary Oral Axillary Axillary  Resp: 20 20 20 20   Weight:      SpO2: 100% 100% 100% 100%     Intake/Output Summary (Last 24 hours) at 05/14/14 1020 Last data filed at 05/14/14 0600  Gross per 24 hour  Intake    855 ml  Output   1750 ml  Net   -895 ml   Filed Weights   05/11/14 0601  Weight: 67.1 kg (147 lb 14.9 oz)    Exam:  General: in no acute distress.  HEENT: No bruits, no goiter.  Heart: Regular rate and rhythm, without murmurs, rubs, gallops.  Lungs: Good air movement, clear Abdomen: Soft, nontender, nondistended, positive bowel sounds.    Data Reviewed: Basic Metabolic Panel:  Recent Labs Lab 05/12/14 0513 05/12/14 1651 05/13/14 0706 05/13/14 1702 05/14/14 0537  NA 146 140 143 136* 139  K 4.6 4.7 5.2 4.8 4.5  CL 114* 109 109 103 107  CO2 24 21 24 22 23   GLUCOSE 196* 349* 172* 354* 170*  BUN 39* 39* 34* 35* 32*  CREATININE 2.06* 1.97* 1.90* 1.90* 1.84*  CALCIUM 8.5 8.1* 8.4 8.1* 8.2*   Liver Function Tests:  Recent Labs Lab 05/10/14 2124 05/12/14 0513  AST 25 32  ALT 32 31  ALKPHOS 82 74  BILITOT 0.2* 0.2*  PROT 6.9 6.4  ALBUMIN 2.1* 2.0*   No results found for this basename: LIPASE, AMYLASE,  in the last 168 hours No results found for this basename: AMMONIA,  in the last 168 hours CBC:  Recent Labs Lab 05/10/14 2124 05/11/14 0548 05/12/14 0513  WBC 15.4* 16.8* 14.2*  NEUTROABS 12.9*  --  11.4*  HGB 9.8* 9.7* 9.9*  HCT 29.3* 29.3* 28.9*  MCV 87.5 87.7 86.3  PLT 356 339 355   Cardiac Enzymes: No results found for this basename: CKTOTAL, CKMB, CKMBINDEX, TROPONINI,  in the last 168 hours BNP (last 3 results)  Recent Labs  12/12/13 2340  PROBNP 6530.0*   CBG:  Recent Labs Lab 05/13/14 1135 05/13/14 1631 05/13/14 1854 05/13/14 2211 05/14/14 0734  GLUCAP 234* 348* 333* 242* 158*    Recent Results (from the past 240 hour(s))  CULTURE, BLOOD (ROUTINE X 2)     Status: None   Collection Time    05/10/14  9:24 PM      Result Value Ref Range Status   Specimen Description BLOOD LEFT FOREARM   Final   Special  Requests BOTTLES DRAWN AEROBIC ONLY 4CC   Final   Culture NO GROWTH 4 DAYS   Final   Report Status PENDING   Incomplete  CULTURE, BLOOD (ROUTINE X 2)     Status: None   Collection Time    05/10/14  9:24 PM      Result Value Ref Range Status   Specimen Description BLOOD RIGHT FOREARM   Final   Special Requests     Final   Value: BOTTLES DRAWN AEROBIC AND ANAEROBIC AEB=4CC ANA=6CC   Culture NO GROWTH 4 DAYS   Final   Report Status PENDING   Incomplete  URINE CULTURE     Status: None   Collection Time    05/10/14  9:56 PM      Result Value Ref Range Status   Specimen Description URINE, CATHETERIZED   Final   Special Requests NONE   Final   Culture  Setup Time     Final   Value: 05/11/2014 13:43     Performed at [facility]   Colony Count     Final   Value: NO GROWTH     [facility]formed at [facility]   Culture     Final   Value: NO GROWTH     Performed at [facility]   Report Status 05/12/2014 FINAL   Final  MRSA PCR SCREENING     Status: Abnormal   Collection Time    05/11/14  2:00 AM      Result Value Ref Range Status   MRSA by PCR POSITIVE (*) NEGATIVE Final   Comment:            The GeneXpert MRSA Assay (FDA     approved for NASAL specimens     only), is one component of a     comprehensive MRSA colonization     surveillance program. It is not     intended to diagnose MRSA     infection nor to guide or     monitor treatment for     MRSA infections.     RESULT CALLED TO, READ BACK BY AND VERIFIED WITH:     GAVIN B AT 0424 ON [id number] BY FORSYTH K     Studies: No results found.  Scheduled Meds: . amLODipine  5 mg Oral Daily  . antiseptic oral rinse  15 mL Mouth Rinse BID  . atorvastatin  20 mg Oral QPM  . cefTRIAXone (ROCEPHIN)  IV  1 g Intravenous Q24H  . Chlorhexidine Gluconate Cloth  6 each Topical Q0600  . insulin aspart  0-15 Units Subcutaneous TID WC  . insulin glargine  5 Units Subcutaneous QHS  . mupirocin ointment  1 application  Nasal BID  . sertraline  50 mg Oral Daily  Continuous Infusions: . dextrose 50 mL/hr at 05/12/14 2209     Marinda Elk  [facility] Pager [phone]. If 8PM-8AM, please contact night-coverage at [email or web address], password [facility] 05/14/2014, 10:20 AM  LOS: 4 days    **Disclaimer: This note may have been dictated with voice recognition software. Similar sounding words can inadvertently be transcribed and this note may contain transcription errors which may not have been corrected upon publication of note.**

## 2014-05-14 NOTE — Progress Notes (Signed)
05/14/14 1900 Patient refused lab draw for BMET this evening. Text-paged MD to notify. Earnstine RegalAshley Latifah Padin, RN

## 2014-05-15 LAB — BASIC METABOLIC PANEL
Anion gap: 11 (ref 5–15)
BUN: 37 mg/dL — AB (ref 6–23)
CHLORIDE: 103 meq/L (ref 96–112)
CO2: 21 mEq/L (ref 19–32)
Calcium: 8.2 mg/dL — ABNORMAL LOW (ref 8.4–10.5)
Creatinine, Ser: 2.04 mg/dL — ABNORMAL HIGH (ref 0.50–1.35)
GFR calc non Af Amer: 30 mL/min — ABNORMAL LOW (ref >90)
GFR, EST AFRICAN AMERICAN: 35 mL/min — AB (ref >90)
GLUCOSE: 323 mg/dL — AB (ref 70–99)
Potassium: 4.9 mEq/L (ref 3.7–5.3)
Sodium: 135 mEq/L — ABNORMAL LOW (ref 137–147)

## 2014-05-15 LAB — GLUCOSE, CAPILLARY
GLUCOSE-CAPILLARY: 240 mg/dL — AB (ref 70–99)
Glucose-Capillary: 102 mg/dL — ABNORMAL HIGH (ref 70–99)
Glucose-Capillary: 83 mg/dL (ref 70–99)
Glucose-Capillary: 299 mg/dL — ABNORMAL HIGH (ref 70–99)
Glucose-Capillary: 264 mg/dL — ABNORMAL HIGH (ref 70–99)

## 2014-05-15 LAB — CULTURE, BLOOD (ROUTINE X 2)
Culture: NO GROWTH
Culture: NO GROWTH

## 2014-05-15 LAB — URINE CULTURE: Colony Count: 30000

## 2014-05-15 NOTE — Progress Notes (Signed)
Patient refused labs this morning. Text-paged MD.

## 2014-05-15 NOTE — Progress Notes (Addendum)
[facility] PROGRESS NOTE Interim History: 73 y/o ? 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 At baseline puree iet, transfers from wheelchair to bed, and is total care and has been this way since last significant stroke   Assessment/Plan: Encephalopathy, metabolic  - Multifactorial due to pyelonephritis and hypernatremia. - Ct head as below. - Pt recommended SNF. - D/w with family they will like to consult PMT. - Family meeting and decided no feeding tube, no CT scan or further aggressive treatment.  Sepsis due to Pyelonephritis, acute - On IV rocephin 7.28.2015, UC and BC negative till date. Changed to levaquin has remained afebrile. - Was on Aveelox at facility, so UC will not be helpful. - Cont to improved Bp stable afebrile. - ? Degree of dementia  AKI on CKD stage III: - DUE TO PRE-RENAL - AT Baseline Cr. 1.9. On admission 2.7 improved with IV fluids.  Malnutrition of moderate degree - Ensure TID.  Hypernatremia/Hyperkalemia - Resolved with IV fluids.  Diabetes mellitus: - Continue Lantus 5 units every afternoon and monitor a.m.   Dementia: -continue sertraline 50 daily  Essential hypertension, benign - poorly controlled.   Code Status: DO NOT RESUSCITATE  Family Communication: Discussed with Wife at bedside  Disposition Plan: Inpatient    Consultants:  none  Procedures: CT head 7.29 : No acute intracranial abnormalities    Antibiotics: Rocephin 7.28.2015  HPI/Subjective: Alert today, relates pain improved.  Objective: Filed Vitals:   05/14/14 1823 05/14/14 1928 05/14/14 2248 05/15/14 0454  BP: 153/66  135/50 136/56  Pulse: 87  81 80  Temp: 99.8 F (37.7 C)  98.5 F (36.9 C) 98.9 F (37.2 C)  TempSrc: Axillary   Axillary Axillary  Resp: 20  20 20   Height:  5\' 8"  (1.727 m)    Weight:      SpO2: 97%  96% 96%    Intake/Output Summary (Last 24 hours) at 05/15/14 1050 Last data filed at 05/15/14 0800  Gross per 24 hour  Intake    480 ml  Output   1250 ml  Net   -770 ml   Filed Weights   05/11/14 0601 05/14/14 1131  Weight: 67.1 kg (147 lb 14.9 oz) 65 kg (143 lb 4.8 oz)    Exam:  General: in no acute distress. cachectic HEENT: No bruits, no goiter.  Heart: Regular rate and rhythm, without murmurs, rubs, gallops.  Lungs: Good air movement, clear Abdomen: Soft, nontender, nondistended, positive bowel sounds.    Data Reviewed: Basic Metabolic Panel:  Recent Labs Lab 05/12/14 0513 05/12/14 1651 05/13/14 0706 05/13/14 1702 05/14/14 0537  NA 146 140 143 136* 139  K 4.6 4.7 5.2 4.8 4.5  CL 114* 109 109 103 107  CO2 24 21 24 22 23   GLUCOSE 196* 349* 172* 354* 170*  BUN 39* 39* 34* 35* 32*  CREATININE 2.06* 1.97* 1.90* 1.90* 1.84*  CALCIUM 8.5 8.1* 8.4 8.1* 8.2*   Liver Function Tests:  Recent Labs Lab 05/10/14 2124 05/12/14 0513  AST 25 32  ALT 32 31  ALKPHOS 82 74  BILITOT 0.2* 0.2*  PROT 6.9 6.4  ALBUMIN 2.1* 2.0*   No results found for this basename: LIPASE, AMYLASE,  in the last 168 hours No results found for this basename: AMMONIA,  in the last 168 hours CBC:  Recent Labs Lab 05/10/14 2124 05/11/14 0548 05/12/14 0513  WBC 15.4* 16.8* 14.2*  NEUTROABS 12.9*  --  11.4*  HGB 9.8* 9.7* 9.9*  HCT 29.3* 29.3* 28.9*  MCV 87.5 87.7 86.3  PLT 356 339 355   Cardiac Enzymes: No results found for this basename: CKTOTAL, CKMB, CKMBINDEX, TROPONINI,  in the last 168 hours BNP (last 3 results)  Recent Labs  12/12/13 2340  PROBNP 6530.0*   CBG:  Recent Labs Lab 05/14/14 1042 05/14/14 1143 05/14/14 1631 05/14/14 2138 05/15/14 0750  GLUCAP 137* 123* 91 102* 83    Recent Results (from the past 240 hour(s))  CULTURE, BLOOD (ROUTINE X 2)     Status: None    Collection Time    05/10/14  9:24 PM      Result Value Ref Range Status   Specimen Description BLOOD LEFT FOREARM   Final   Special Requests BOTTLES DRAWN AEROBIC ONLY 4CC   Final   Culture NO GROWTH 5 DAYS   Final   Report Status 05/15/2014 FINAL   Final  CULTURE, BLOOD (ROUTINE X 2)     Status: None   Collection Time    05/10/14  9:24 PM      Result Value Ref Range Status   Specimen Description BLOOD RIGHT FOREARM   Final   Special Requests     Final   Value: BOTTLES DRAWN AEROBIC AND ANAEROBIC AEB=4CC ANA=6CC   Culture NO GROWTH 5 DAYS   Final   Report Status 05/15/2014 FINAL   Final  URINE CULTURE     Status: None   Collection Time    05/10/14  9:56 PM      Result Value Ref Range Status   Specimen Description URINE, CATHETERIZED   Final   Special Requests NONE   Final   Culture  Setup Time     Final   Value: 05/11/2014 13:43     Performed at [facility] Count     Final   Value: NO GROWTH     Performed at [facility]   Culture     Final   Value: NO GROWTH     Performed at [facility]   Report Status 05/12/2014 FINAL   Final  MRSA PCR SCREENING     Status: Abnormal   Collection Time    05/11/14  2:00 AM      Result Value Ref Range Status   MRSA by PCR POSITIVE (*) NEGATIVE Final   Comment:            The GeneXpert MRSA Assay (FDA     approved for NASAL specimens     only), is one component of a     comprehensive MRSA colonization     surveillance program. It is not     intended to diagnose MRSA     infection nor to guide or     monitor treatment for     MRSA infections.     RESULT CALLED TO, READ BACK BY AND VERIFIED WITH:     GAVIN B AT 0424 ON [id number] BY FORSYTH K  URINE CULTURE     Status: None   Collection Time    05/13/14  1:56 PM      Result Value Ref Range Status   Specimen Description URINE, RANDOM   Final   Special Requests NONE   Final   Culture  Setup Time     Final   Value:  05/14/2014 00:41     Performed at  [facility]   Colony Count     Final   Value: 30,000 COLONIES/ML     Performed at [facility]   Culture     Final   Value: DIPHTHEROIDS(CORYNEBACTERIUM SPECIES)     Note: Standardized susceptibility testing for this organism is not available.     Performed at [facility]   Report Status 05/15/2014 FINAL   Final     Studies: No results found.  Scheduled Meds: . amLODipine  5 mg Oral Daily  . antiseptic oral rinse  15 mL Mouth Rinse BID  . atorvastatin  20 mg Oral QPM  . insulin aspart  0-15 Units Subcutaneous TID WC  . insulin glargine  5 Units Subcutaneous QHS  . levofloxacin  750 mg Oral Daily  . mupirocin ointment  1 application Nasal BID  . sertraline  50 mg Oral Daily   Continuous Infusions:     Marinda ElkFELIZ ORTIZ, Brooklin Rieger  [facility] Pager [phone]. If 8PM-8AM, please contact night-coverage at [email or web address], password [email or web address] 05/15/2014, 10:50 AM  LOS: 5 days    **Disclaimer: This note may have been dictated with voice recognition software. Similar sounding words can inadvertently be transcribed and this note may contain transcription errors which may not have been corrected upon publication of note.**

## 2014-05-15 NOTE — Progress Notes (Signed)
Palliative consult received. At this time we are unable to provide formal palliative consultation at [facility]. If this patient is appropriate for Hospice services please place a care management or social [facility] so they can review her case and determine readiness and eligibility. We look forward to being able to serve [facility] palliative needs in the very near future. Please feel free to call our team at [phone] if we can answer questions or provide additional assistance.  Anderson MaltaElizabeth Golding, DO Palliative Medicine

## 2014-05-16 DIAGNOSIS — J9600 Acute respiratory failure, unspecified whether with hypoxia or hypercapnia: Secondary | ICD-10-CM

## 2014-05-16 LAB — GLUCOSE, CAPILLARY
GLUCOSE-CAPILLARY: 212 mg/dL — AB (ref 70–99)
Glucose-Capillary: 192 mg/dL — ABNORMAL HIGH (ref 70–99)
Glucose-Capillary: 183 mg/dL — ABNORMAL HIGH (ref 70–99)
Glucose-Capillary: 199 mg/dL — ABNORMAL HIGH (ref 70–99)

## 2014-05-16 LAB — BASIC METABOLIC PANEL
ANION GAP: 9 (ref 5–15)
Anion gap: 10 (ref 5–15)
BUN: 36 mg/dL — AB (ref 6–23)
BUN: 37 mg/dL — ABNORMAL HIGH (ref 6–23)
CALCIUM: 8.1 mg/dL — AB (ref 8.4–10.5)
CHLORIDE: 106 meq/L (ref 96–112)
CHLORIDE: 104 meq/L (ref 96–112)
CO2: 23 mEq/L (ref 19–32)
CO2: 24 meq/L (ref 19–32)
CREATININE: 2.06 mg/dL — AB (ref 0.50–1.35)
Calcium: 8.4 mg/dL (ref 8.4–10.5)
Creatinine, Ser: 2.15 mg/dL — ABNORMAL HIGH (ref 0.50–1.35)
GFR calc Af Amer: 35 mL/min — ABNORMAL LOW (ref >90)
GFR calc Af Amer: 33 mL/min — ABNORMAL LOW (ref >90)
GFR calc non Af Amer: 30 mL/min — ABNORMAL LOW (ref >90)
GFR calc non Af Amer: 29 mL/min — ABNORMAL LOW (ref >90)
GLUCOSE: 249 mg/dL — AB (ref 70–99)
Glucose, Bld: 217 mg/dL — ABNORMAL HIGH (ref 70–99)
POTASSIUM: 5 meq/L (ref 3.7–5.3)
Potassium: 4.6 mEq/L (ref 3.7–5.3)
SODIUM: 137 meq/L (ref 137–147)
Sodium: 139 mEq/L (ref 137–147)

## 2014-05-16 MED ORDER — LEVOFLOXACIN 750 MG PO TABS
750.0000 mg | ORAL_TABLET | ORAL | Status: DC
  Administered 2014-05-17: 750 mg via ORAL
  Filled 2014-05-16 (×2): qty 1

## 2014-05-16 NOTE — Progress Notes (Signed)
Patient ID: Brady Frazier, male   DOB: 1940/11/04, 73 y.o.   MRN: [id number] [facility] PROGRESS NOTE  Brady Frazier WUJ:[id number] DOB: 14-Dec-1940 DOA: 05/10/2014 PCP: Terald Sleeper, MD  Brief narrative: 43 -year-old male with past medical history of diabetes, dementia, hypertension, 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 CT head on the admission did not show acute intracranial findings but there was evidence of previous infarcts.  Assessment/Plan:   Principal problem: Acute metabolic encephalopathy  Multifactorial and likely includes hypernatremia, urinary tract infection / pyelonephritis, worsening dementia  CT head on the admission did not show acute intracranial findings but there was evidence of previous infarct.  Patient was started on Rocephin on the admission and this is now changed to Levaquin for treatment of UTI and pyelonephritis.  No significant changes in mental status.  Patient will go to skilled nursing facility on discharge. Social work Catering manager.  Active problems: Sepsis due to Pyelonephritis, acute   Initially on IV Rocephin. This was changed to Levaquin on 05/14/2014. Urine culture grew diphtheroid species but not significant amount. AKI on CKD stage III:   Likely prerenal, secondary to UTI and/or pyelonephritis.  Creatinine is worsening over past 48 hours.  Per family wishes no further aggressive workup. Malnutrition of moderate degree   Ensure TID.  Hypernatremia/Hyperkalemia   Resolved with IV fluids.  Diabetes mellitus:   Continue Lantus 5 units every afternoon and monitor a.m.   A1c 5 months ago 7.5 Dementia:   Continue sertraline 50 daily   Diabetes  Continue Lantus 5 units at bedtime, continue sliding scale insulin Essential hypertension, benign   Continue Norvasc 5 mg daily.   DVT prophylaxis: SCD's while pt is in hospital   Code Status: DNR/DNI  Family Communication: Family not at the bedside this am Disposition Plan: likely SNF on discharge. SW assisting D/C plan   Consultants:   None  Procedures/Studies:   CT head 7.29 : No acute intracranial abnormalities  Antibiotics:   Rocephin 05/13/2014 --> 05/14/2014   Levaquin 05/14/2014 -->   Manson Passey, MD  [facility] Pager [phone]  If 7PM-7AM, please contact night-coverage [email or web address] Password [email or web address] 05/16/2014, 1:17 PM   LOS: 6 days    HPI/Subjective: No acute overnight events.  Objective: Filed Vitals:   05/15/14 0454 05/15/14 1322 05/15/14 2219 05/16/14 0528  BP: 136/56 122/55 127/46 124/42  Pulse: 80 97 75 72  Temp: 98.9 F (37.2 C) 98.2 F (36.8 C) 99.8 F (37.7 C) 98.6 F (37 C)  TempSrc: Axillary Axillary Axillary Oral  Resp: 20 20 20 20   Height:      Weight:    68.1 kg (150 lb 2.1 oz)  SpO2: 96% 99% 99% 98%    Intake/Output Summary (Last 24 hours) at 05/16/14 1317 Last data filed at 05/16/14 1316  Gross per 24 hour  Intake    240 ml  Output   1000 ml  Net   -760 ml    Exam:   General:  Pt is sleeping, not in acute distress  Cardiovascular: Regular rate and rhythm, S1/S2 appreciated  Respiratory: Clear to auscultation bilaterally, no wheezing  Abdomen: Soft, non tender, non distended, bowel sounds present  Extremities: pulses DP and PT palpable bilaterally   Data Reviewed: Basic Metabolic Panel:  Recent Labs Lab 05/13/14 0706 05/13/14  1702 05/14/14 0537 05/15/14 1705 05/16/14 0525  NA 143 136* 139 135* 139  K 5.2 4.8 4.5 4.9 4.6  CL 109 103 107 103 106  CO2 24 22 23 21 23   GLUCOSE 172* 354* 170* 323* 217*  BUN 34* 35* 32* 37* 36*  CREATININE 1.90* 1.90* 1.84* 2.04* 2.06*  CALCIUM 8.4 8.1* 8.2*  8.2* 8.1*   Liver Function Tests:  Recent Labs Lab 05/10/14 2124 05/12/14 0513  AST 25 32  ALT 32 31  ALKPHOS 82 74  BILITOT 0.2* 0.2*  PROT 6.9 6.4  ALBUMIN 2.1* 2.0*   No results found for this basename: LIPASE, AMYLASE,  in the last 168 hours No results found for this basename: AMMONIA,  in the last 168 hours CBC:  Recent Labs Lab 05/10/14 2124 05/11/14 0548 05/12/14 0513  WBC 15.4* 16.8* 14.2*  NEUTROABS 12.9*  --  11.4*  HGB 9.8* 9.7* 9.9*  HCT 29.3* 29.3* 28.9*  MCV 87.5 87.7 86.3  PLT 356 339 355   Cardiac Enzymes: No results found for this basename: CKTOTAL, CKMB, CKMBINDEX, TROPONINI,  in the last 168 hours BNP: No components found with this basename: POCBNP,  CBG:  Recent Labs Lab 05/15/14 1118 05/15/14 1607 05/15/14 2149 05/16/14 0734 05/16/14 1106  GLUCAP 240* 299* 264* 192* 183*    Recent Results (from the past 240 hour(s))  CULTURE, BLOOD (ROUTINE X 2)     Status: None   Collection Time    05/10/14  9:24 PM      Result Value Ref Range Status   Specimen Description BLOOD LEFT FOREARM   Final   Special Requests BOTTLES DRAWN AEROBIC ONLY 4CC   Final   Culture NO GROWTH 5 DAYS   Final   Report Status 05/15/2014 FINAL   Final  CULTURE, BLOOD (ROUTINE X 2)     Status: None   Collection Time    05/10/14  9:24 PM      Result Value Ref Range Status   Specimen Description BLOOD RIGHT FOREARM   Final   Special Requests     Final   Value: BOTTLES DRAWN AEROBIC AND ANAEROBIC AEB=4CC ANA=6CC   Culture NO GROWTH 5 DAYS   Final   Report Status 05/15/2014 FINAL   Final  URINE CULTURE     Status: None   Collection Time    05/10/14  9:56 PM      Result Value Ref Range Status   Specimen Description URINE, CATHETERIZED   Final   Special Requests NONE   Final   Culture  Setup Time     Final   Value: 05/11/2014 13:43     Performed at [facility]   Colony Count     Final   Value: NO GROWTH     Performed at [facility]   Culture      Final   Value: NO GROWTH     Performed at [facility]   Report Status 05/12/2014 FINAL   Final  MRSA PCR SCREENING     Status: Abnormal   Collection Time    05/11/14  2:00 AM      Result Value Ref Range Status   MRSA by PCR POSITIVE (*) NEGATIVE Final   Comment:            The GeneXpert MRSA Assay (FDA     approved for NASAL specimens     only), is one component of a     comprehensive MRSA colonization  surveillance program. It is not     intended to diagnose MRSA     infection nor to guide or     monitor treatment for     MRSA infections.     RESULT CALLED TO, READ BACK BY AND VERIFIED WITH:     GAVIN B AT 0424 ON [id number] BY FORSYTH K  URINE CULTURE     Status: None   Collection Time    05/13/14  1:56 PM      Result Value Ref Range Status   Specimen Description URINE, RANDOM   Final   Special Requests NONE   Final   Culture  Setup Time     Final   Value: 05/14/2014 00:41     Performed at [facility] Count     Final   Value: 30,000 COLONIES/ML     Performed at [facility]   Culture     Final   Value: DIPHTHEROIDS(CORYNEBACTERIUM SPECIES)     Note: Standardized susceptibility testing for this organism is not available.     Performed at [facility]   Report Status 05/15/2014 FINAL   Final     Studies: No results found.  Scheduled Meds: . amLODipine  5 mg Oral Daily  . atorvastatin  20 mg Oral QPM  . insulin aspart  0-15 Units Subcutaneous TID WC  . insulin glargine  5 Units Subcutaneous QHS  . levofloxacin  750 mg Oral Q48H  . sertraline  50 mg Oral Daily

## 2014-05-16 NOTE — Progress Notes (Signed)
Pharmacy:  Estimated Creatinine Clearance: 30.3 ml/min (by C-G formula based on Cr of 2.06).   Plan: Levaquin adjusted to 750mg  every 48 hours for renal dysfunction.  Mady GemmaHayes, Alejandria Wessells R, [facility]  05/16/2014 10:00 AM

## 2014-05-16 NOTE — Progress Notes (Signed)
Patient said he was hurting. Attempted to crush medications and give to patient. He stated no multiple times even when prompted it was pain medication.

## 2014-05-16 NOTE — Progress Notes (Signed)
PT Cancellation Note  Patient Details Name: Brady Frazier MRN: [id number] DOB: 10/22/1940   Cancelled Treatment:    Reason Eval/Treat Not Completed: Other (comment)  Received new orders, and chart reviewed.  PT evaluation completed on 7/31, with recommendation for SNF placement.  No changes in condition reported by case management/RN.  Recommendation for SNF still appropriate at this time.  Acute PT to d/c from service.   Nivek Powley 05/16/2014, 3:23 PM

## 2014-05-17 DIAGNOSIS — E1129 Type 2 diabetes mellitus with other diabetic kidney complication: Secondary | ICD-10-CM

## 2014-05-17 DIAGNOSIS — N058 Unspecified nephritic syndrome with other morphologic changes: Secondary | ICD-10-CM

## 2014-05-17 LAB — BASIC METABOLIC PANEL
Anion gap: 10 (ref 5–15)
BUN: 36 mg/dL — AB (ref 6–23)
CO2: 25 mEq/L (ref 19–32)
CREATININE: 2.19 mg/dL — AB (ref 0.50–1.35)
Calcium: 8.6 mg/dL (ref 8.4–10.5)
Chloride: 107 mEq/L (ref 96–112)
GFR, EST AFRICAN AMERICAN: 32 mL/min — AB (ref >90)
GFR, EST NON AFRICAN AMERICAN: 28 mL/min — AB (ref >90)
GLUCOSE: 157 mg/dL — AB (ref 70–99)
Potassium: 5.1 mEq/L (ref 3.7–5.3)
Sodium: 142 mEq/L (ref 137–147)

## 2014-05-17 LAB — GLUCOSE, CAPILLARY
Glucose-Capillary: 146 mg/dL — ABNORMAL HIGH (ref 70–99)
Glucose-Capillary: 132 mg/dL — ABNORMAL HIGH (ref 70–99)

## 2014-05-17 MED ORDER — LEVOFLOXACIN 750 MG PO TABS: 750.0000 mg | ORAL_TABLET | ORAL | Status: AC

## 2014-05-17 MED ORDER — TRAMADOL HCL 50 MG PO TABS: 50.0000 mg | ORAL_TABLET | Freq: Every morning | ORAL | Status: AC

## 2014-05-17 NOTE — Progress Notes (Signed)
[id number] 1240 [facility] [place], left floor in stable condition via stretcher, EMS transport. IV site d/c'd and within normal limits prior to discharge. Discharge packet prepared by CSW, sent with patient. Called report to Alinda Doomshristina Smith, receiving nurse at facility. Earnstine RegalAshley [name] Zeitlin, RN

## 2014-05-17 NOTE — Progress Notes (Signed)
05/17/14 1117 Patient not wanting to eat earlier this morning with nurse tech assist. Pt ate applesauce cup with medications and then ate 75% of breakfast tray. Tolerated well. Earnstine RegalAshley Zarin Hagmann, RN

## 2014-05-17 NOTE — Clinical Social Work Note (Signed)
Patient ready for discharge today, will transfer to [facility] [place] [facility].  Dischage summary faxed to facility via [employer], FL2 reviewed w RN and updated as needed, discharge packet prepared and placed w shadow chart for transfer.  Son Jorja Loaim notified by phone, wife Steward DroneBrenda notified by contact w her ALF [facility]([place]), son also says he will contact patient's wife.  [facility] [place] agreeable to transfer, family agreeable, patient oriented to person only.  CSW signing off as no further SW needs identified.  Santa GeneraAnne Cunningham, LCSW Clinical Social Worker [phone])

## 2014-05-17 NOTE — Clinical Social Work Note (Signed)
EMS called, will arrive when available.  [facility] [place] ready for patient.  CSW signing off as no further SW needs identified.  Santa GeneraAnne Matilde Pottenger, LCSW Clinical Social Worker [phone])

## 2014-05-17 NOTE — Discharge Instructions (Signed)
Follow-up with hospice care

## 2014-05-17 NOTE — Discharge Summary (Signed)
Physician Discharge Summary  Brady Frazier ZOX:[id number] DOB: 02/25/1941 DOA: 05/10/2014  PCP: Terald Sleeper, MD  Admit date: 05/10/2014 Discharge date: 05/17/2014  Time spent: >35 minutes  Recommendations for Outpatient Follow-up:  F/u with hospice care   Discharge Diagnoses:  Principal Problem:   Pyelonephritis, acute Active Problems:   Essential hypertension, benign   DM type 2 (diabetes mellitus, type 2)   Severe sepsis   Hyperkalemia   Aphasia as late effect of cerebrovascular accident   Hemiparesis affecting right side as late effect of stroke   Malnutrition of moderate degree   Encephalopathy, metabolic   Discharge Condition: stable   Diet recommendation: comfort , DM  Filed Weights   05/11/14 0601 05/14/14 1131 05/16/14 0528  Weight: 67.1 kg (147 lb 14.9 oz) 65 kg (143 lb 4.8 oz) 68.1 kg (150 lb 2.1 oz)    History of present illness:  73 -year-old male with past medical history of diabetes, dementia, hypertension, 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 CT head on the admission did not show acute intracranial findings but there was evidence of previous infarcts.   Hospital Course:  Acute metabolic encephalopathy  Multifactorial and likely includes hypernatremia, urinary tract infection / pyelonephritis, worsening dementia  CT head on the admission did not show acute intracranial findings but there was evidence of previous infarct.  Patient was started on Rocephin on the admission and this is now changed to Levaquin for treatment of UTI and pyelonephritis.  No significant changes in mental status.  Active problems:  Sepsis due to Pyelonephritis, acute  Initially on IV Rocephin. This was changed to Levaquin on  05/14/2014. Urine culture grew diphtheroid species but not significant amount. To complete OP atx  AKI on CKD stage III:  Likely prerenal, secondary to UTI and/or pyelonephritis.  Creatinine is worsening over past 48 hours.  Per family wishes no further aggressive workup. Malnutrition of moderate degree  Ensure TID.  Hypernatremia/Hyperkalemia  Resolved with IV fluids.  Diabetes mellitus:  Continue Lantus; Ha1c  7.5 Dementia:  Continue sertraline 50 daily  Essential hypertension, benign  Continue Norvasc 5 mg daily.   Prognosis is poor due to advanced dementia, recurrent infections, h/o CVA, with intracranial bleed;  -Patient was evaluated by palliative care, and referred for hospice   Procedures:  none (i.e. Studies not automatically included, echos, thoracentesis, etc; not x-rays)  Consultations:  None   Discharge Exam: Filed Vitals:   05/17/14 0520  BP: 176/58  Pulse: 96  Temp: 99 F (37.2 C)  Resp: 18    General: alert, confused Cardiovascular: s1,s2 rrr Respiratory: CTA BL  Discharge Instructions  Discharge Instructions   Diet - low sodium heart healthy    Complete by:  As directed      Discharge instructions    Complete by:  As directed   Follow up with hospice care     Increase activity slowly    Complete by:  As directed             Medication List    STOP taking these medications       moxifloxacin 400 MG tablet  Commonly known as:  AVELOX      TAKE these medications       acetaminophen 325 MG tablet  Commonly known as:  TYLENOL  Take  650 mg by mouth every 4 (four) hours as needed. Pain/fever     amLODipine 5 MG tablet  Commonly known as:  NORVASC  Take 5 mg by mouth daily. For HTN     atorvastatin 20 MG tablet  Commonly known as:  LIPITOR  Take 20 mg by mouth daily. For hyperlipidemia     colchicine 0.6 MG tablet  Take 0.6 mg by mouth daily.     DUONEB 0.5-2.5 (3) MG/3ML Soln  Generic drug:  ipratropium-albuterol  Take 3 mLs  by nebulization every 6 (six) hours.     feeding supplement (PRO-STAT SUGAR FREE 64) Liqd  Take 30 mLs by mouth 2 (two) times daily between meals.     insulin aspart 100 UNIT/ML injection  Commonly known as:  novoLOG  Inject 4 Units into the skin once.     insulin glargine 100 UNIT/ML injection  Commonly known as:  LANTUS  Inject 0.05 mLs (5 Units total) into the skin at bedtime.     levofloxacin 750 MG tablet  Commonly known as:  LEVAQUIN  Take 1 tablet (750 mg total) by mouth every other day.     magnesium hydroxide 400 MG/5ML suspension  Commonly known as:  MILK OF MAGNESIA  Take 30 mLs by mouth daily as needed for mild constipation.     sertraline 50 MG tablet  Commonly known as:  ZOLOFT  Take 1 tablet (50 mg total) by mouth daily.     traMADol 50 MG tablet  Commonly known as:  ULTRAM  Take 50 mg by mouth every morning. Take one tablet every morning and every 6 hours when necessary. Hold for sedation or increased confusion       No Known Allergies     Follow-up Information   Follow up with Terald SleeperOBSON,MICHAEL GAVIN, MD In 1 week.   Specialty:  Internal Medicine   Contact information:   [address] [place] [address] [phone]        The results of significant diagnostics from this hospitalization (including imaging, microbiology, ancillary and laboratory) are listed below for reference.    Significant Diagnostic Studies: Dg Chest 1 View  05/10/2014   CLINICAL DATA:  Cough, weakness.  EXAM: CHEST - 1 VIEW  COMPARISON:  12/14/1948  FINDINGS: Aortic dilatation and tortuosity. Heart size normal range. Elevated hemidiaphragms obscure the lung bases. Gaseous distention of stomach. Surgical clips right upper quadrant. Mild lower lobe airspace opacities. No overt pleural effusion. No pneumothorax. Multilevel degenerative changes.  IMPRESSION: Hypoaeration with hemidiaphragm elevation and lower lobe airspace opacities; atelectasis, aspiration, or infiltrate.   Tortuosity and dilatation of the thoracic aorta.  Nonspecific gaseous distention of stomach.   Electronically Signed   By: Jearld LeschAndrew  DelGaizo M.D.   On: 05/10/2014 09:19   Ct Head Wo Contrast  05/10/2014   CLINICAL DATA:  Altered mental status. Unresponsive but appears awake. Diaphoresis and fever.  EXAM: CT HEAD WITHOUT CONTRAST  TECHNIQUE: Contiguous axial images were obtained from the base of the skull through the vertex without intravenous contrast.  COMPARISON:  MRI brain 02/22/2013.  CT head 02/22/2013.  FINDINGS: Since the previous study, the extensive intraparenchymal and intraventricular hemorrhage has resolved. There is now diffuse cerebral atrophy. Prominent ventricular dilatation. Low-attenuation changes in the posterior deep white matter with extension to the cortical surface in the occipital regions bilaterally, right frontal, and left parietal regions. Old encephalomalacia also demonstrated in the left cerebellum. These changes corresponding areas of abnormality on the prior study. No mass  effect or midline shift today. No abnormal extra-axial fluid collections. Basal cisterns are not effaced. No evidence of acute intracranial hemorrhage. No depressed skull fractures. Mucosal thickening and retention cysts in the paranasal sinuses.  IMPRESSION: No acute intracranial abnormalities. Old infarcts and encephalomalacia in the cerebral hemispheres bilaterally and in the left cerebellum. Ventricular dilatation bilaterally. Chronic atrophy.   Electronically Signed   By: Burman Nieves M.D.   On: 05/10/2014 23:06   Dg Chest Port 1 View  05/10/2014   CLINICAL DATA:  Altered mental status. Patient is unresponsive of awake.  EXAM: PORTABLE CHEST - 1 VIEW  COMPARISON:  05/10/2014  FINDINGS: Shallow inspiration. Heart size and pulmonary vascularity are normal for technique. No focal airspace disease or consolidation in the lungs. No blunting of costophrenic angles. No pneumothorax.  IMPRESSION: Shallow  inspiration.  No evidence of active pulmonary disease.   Electronically Signed   By: Burman Nieves M.D.   On: 05/10/2014 21:52    Microbiology: Recent Results (from the past 240 hour(s))  CULTURE, BLOOD (ROUTINE X 2)     Status: None   Collection Time    05/10/14  9:24 PM      Result Value Ref Range Status   Specimen Description BLOOD LEFT FOREARM   Final   Special Requests BOTTLES DRAWN AEROBIC ONLY 4CC   Final   Culture NO GROWTH 5 DAYS   Final   Report Status 05/15/2014 FINAL   Final  CULTURE, BLOOD (ROUTINE X 2)     Status: None   Collection Time    05/10/14  9:24 PM      Result Value Ref Range Status   Specimen Description BLOOD RIGHT FOREARM   Final   Special Requests     Final   Value: BOTTLES DRAWN AEROBIC AND ANAEROBIC AEB=4CC ANA=6CC   Culture NO GROWTH 5 DAYS   Final   Report Status 05/15/2014 FINAL   Final  URINE CULTURE     Status: None   Collection Time    05/10/14  9:56 PM      Result Value Ref Range Status   Specimen Description URINE, CATHETERIZED   Final   Special Requests NONE   Final   Culture  Setup Time     Final   Value: 05/11/2014 13:43     Performed at [facility] Count     Final   Value: NO GROWTH     Performed at [facility]   Culture     Final   Value: NO GROWTH     Performed at [facility]   Report Status 05/12/2014 FINAL   Final  MRSA PCR SCREENING     Status: Abnormal   Collection Time    05/11/14  2:00 AM      Result Value Ref Range Status   MRSA by PCR POSITIVE (*) NEGATIVE Final   Comment:            The GeneXpert MRSA Assay (FDA     approved for NASAL specimens     only), is one component of a     comprehensive MRSA colonization     surveillance program. It is not     intended to diagnose MRSA     infection nor to guide or     monitor treatment for     MRSA infections.     RESULT CALLED TO, READ BACK BY AND VERIFIED WITH:     GAVIN B AT  0424 ON [id number] BY FORSYTH K  URINE CULTURE      Status: None   Collection Time    05/13/14  1:56 PM      Result Value Ref Range Status   Specimen Description URINE, RANDOM   Final   Special Requests NONE   Final   Culture  Setup Time     Final   Value: 05/14/2014 00:41     Performed at [facility] Count     Final   Value: 30,000 COLONIES/ML     Performed at [facility]   Culture     Final   Value: DIPHTHEROIDS(CORYNEBACTERIUM SPECIES)     Note: Standardized susceptibility testing for this organism is not available.     Performed at [facility]   Report Status 05/15/2014 FINAL   Final     Labs: Basic Metabolic Panel:  Recent Labs Lab 05/14/14 0537 05/15/14 1705 05/16/14 0525 05/16/14 1700 05/17/14 0609  NA 139 135* 139 137 142  K 4.5 4.9 4.6 5.0 5.1  CL 107 103 106 104 107  CO2 23 21 23 24 25   GLUCOSE 170* 323* 217* 249* 157*  BUN 32* 37* 36* 37* 36*  CREATININE 1.84* 2.04* 2.06* 2.15* 2.19*  CALCIUM 8.2* 8.2* 8.1* 8.4 8.6   Liver Function Tests:  Recent Labs Lab 05/10/14 2124 05/12/14 0513  AST 25 32  ALT 32 31  ALKPHOS 82 74  BILITOT 0.2* 0.2*  PROT 6.9 6.4  ALBUMIN 2.1* 2.0*   No results found for this basename: LIPASE, AMYLASE,  in the last 168 hours No results found for this basename: AMMONIA,  in the last 168 hours CBC:  Recent Labs Lab 05/10/14 2124 05/11/14 0548 05/12/14 0513  WBC 15.4* 16.8* 14.2*  NEUTROABS 12.9*  --  11.4*  HGB 9.8* 9.7* 9.9*  HCT 29.3* 29.3* 28.9*  MCV 87.5 87.7 86.3  PLT 356 339 355   Cardiac Enzymes: No results found for this basename: CKTOTAL, CKMB, CKMBINDEX, TROPONINI,  in the last 168 hours BNP: BNP (last 3 results)  Recent Labs  12/12/13 2340  PROBNP 6530.0*   CBG:  Recent Labs Lab 05/16/14 0734 05/16/14 1106 05/16/14 1619 05/16/14 1945 05/17/14 0727  GLUCAP 192* 183* 212* 199* 146*       Signed:  Jonette Mate N  [facility] 05/17/2014, 9:18 AM

## 2014-05-17 NOTE — Clinical Social Work Placement (Signed)
    Clinical Social Work Department CLINICAL SOCIAL WORK PLACEMENT NOTE 05/17/2014  Patient:  Caryl AspWALKER,Kaelob  Account Number:  [id number] Admit date:  05/10/2014  Clinical Social Worker:  Santa GeneraANNE Akeria Hedstrom, CLINICAL SOCIAL WORKER  Date/time:  05/11/2014 11:00 AM  Clinical Social Work is seeking post-discharge placement for this patient at the following level of care:   SKILLED NURSING   (*CSW will update this form in Epic as items are completed)   05/12/2014  Patient/family provided with [facility] Department of Clinical Social Work's list of facilities offering this level of care within the geographic area requested by the patient (or if unable, by the patient's family).  05/11/2014  Patient/family informed of their freedom to choose among providers that offer the needed level of care, that participate in Medicare, Medicaid or managed care program needed by the patient, have an available bed and are willing to accept the patient.  05/11/2014  Patient/family informed of [facility]' ownership interest in [facility], as well as of the fact that they are under no obligation to receive care at this facility.  PASARR submitted to EDS on 05/11/2014 PASARR number received on 05/11/2014  FL2 transmitted to all facilities in geographic area requested by pt/family on  05/11/2014 FL2 transmitted to all facilities within larger geographic area on   Patient informed that his/her managed care company has contracts with or will negotiate with  certain facilities, including the following:     Patient/family informed of bed offers received:  05/12/2014 [facility] Physician recommends and patient chooses bed at    [facility] on  05/17/2014 Patient [facility] be [facility] Patient and family notified of transfer on 05/17/2014 Name of family member notified:  Carloyn MannerBrenda Escutia, wife and Francis Dowseim Motta,  son  The following physician request were entered in Epic:   Additional Comments: Patient has preexisting PASARR  Santa GeneraAnne Tina Temme, LCSW Clinical Social Worker [phone])

## 2014-05-17 NOTE — Progress Notes (Signed)
UR chart review completed.  

## 2014-06-14 DEATH — deceased

## 2014-09-28 IMAGING — CT CT HEAD W/O CM
1 series · 15 of 30 positions shown, 19 images · non-contrast
Comparison: MRI brain 02/22/2013.  CT head 02/22/2013.

CLINICAL DATA: Altered mental status. Unresponsive but appears
awake. Diaphoresis and fever.

EXAM:
CT HEAD WITHOUT CONTRAST
TECHNIQUE: Contiguous axial images were obtained from the base of the skull
through the vertex without intravenous contrast.

[Series 2: headseq 4.8 h37s · axial · 0.48mm/px · z∈[+119,+274]mm · 15 of 36 slices shown, 19 images]
[im 2/36  brain]
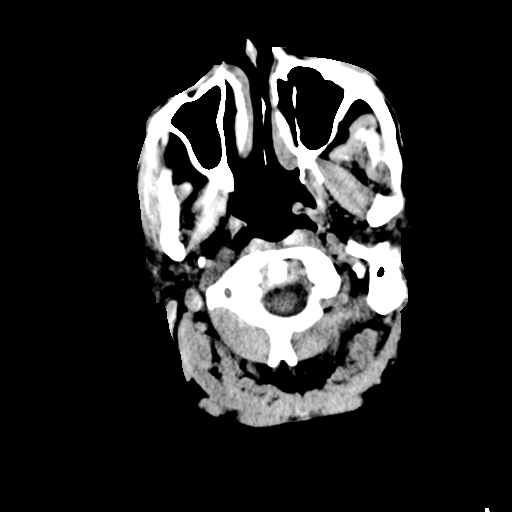
[im 2/36  bone]
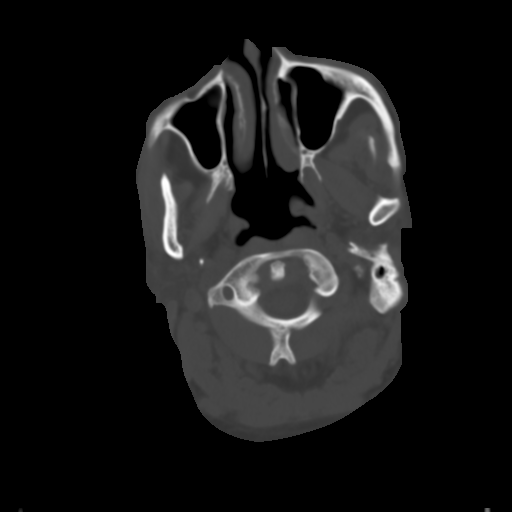
[im 4/36  brain]
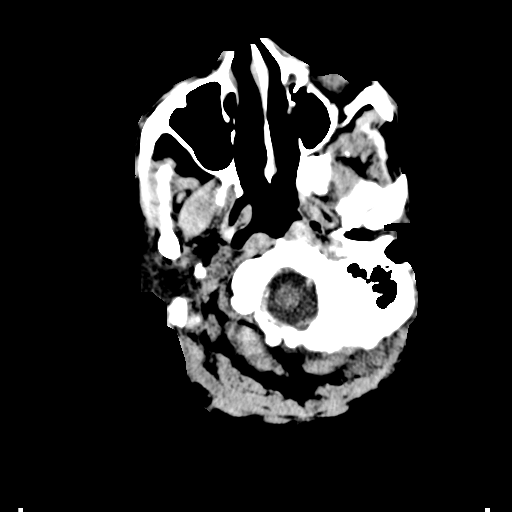
[im 7/36  brain]
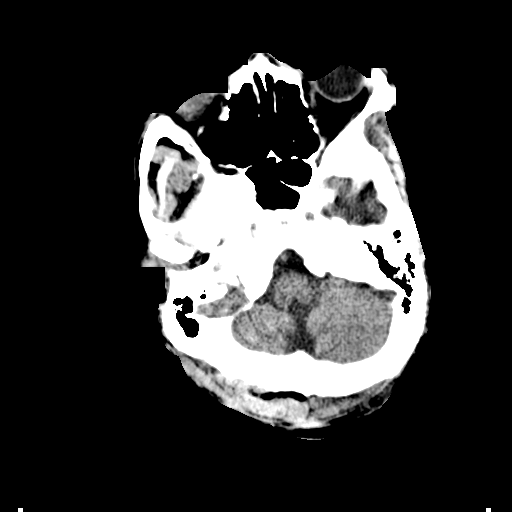
[im 9/36  brain]
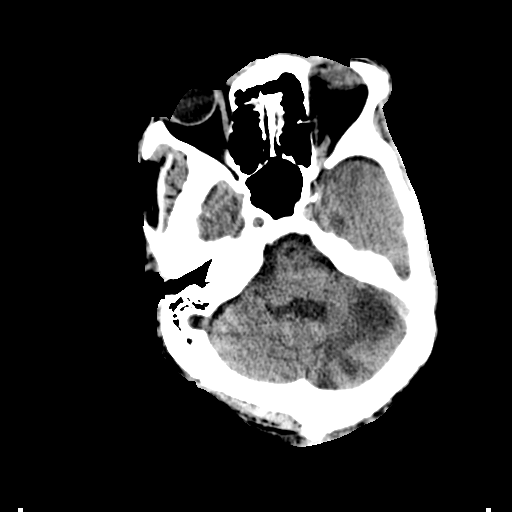
[im 11/36  brain]
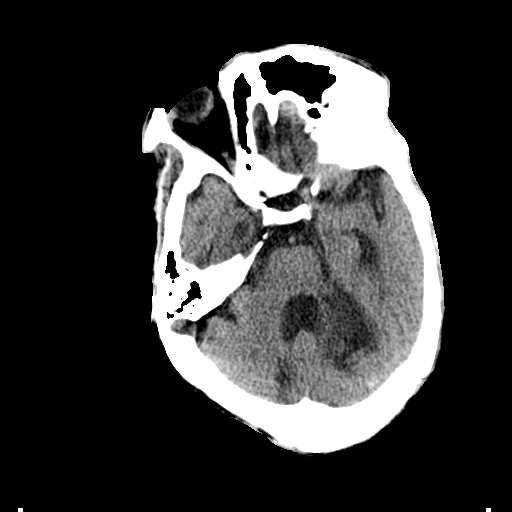
[im 11/36  bone]
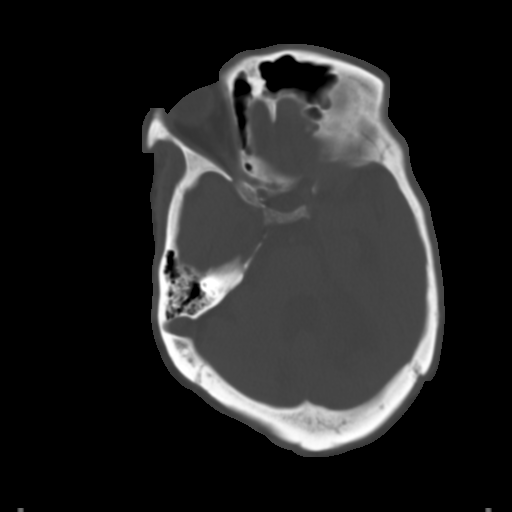
[im 14/36  brain]
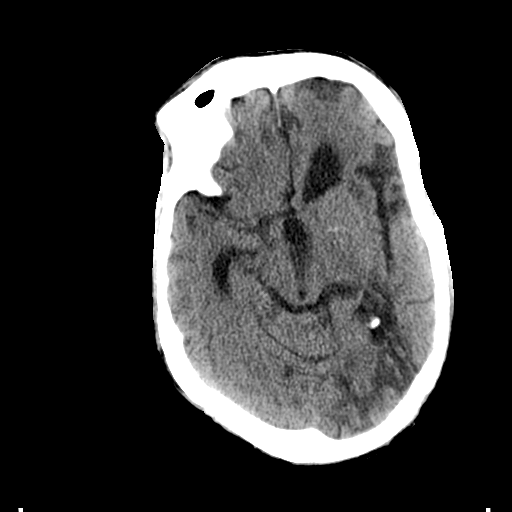
[im 16/36  brain]
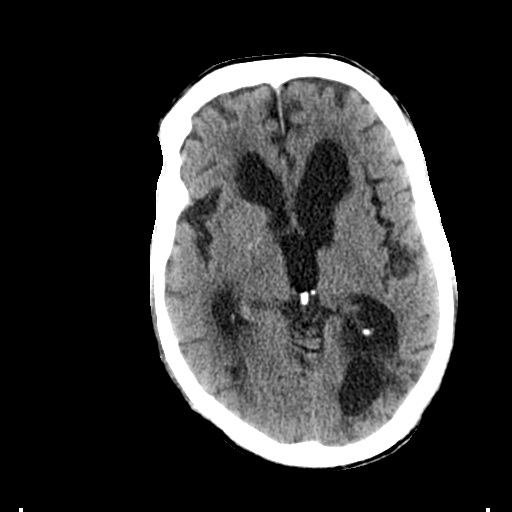
[im 19/36  brain]
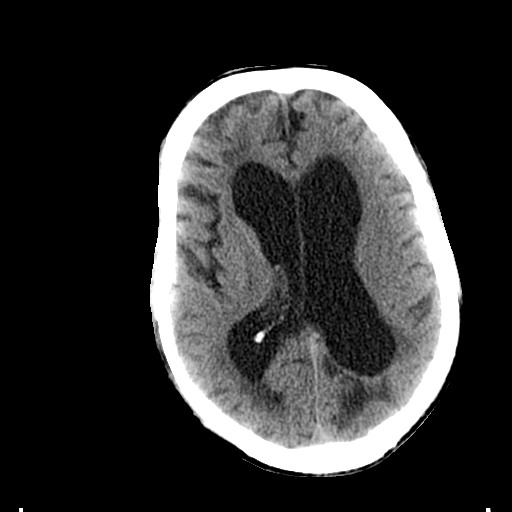
[im 20/36  brain]
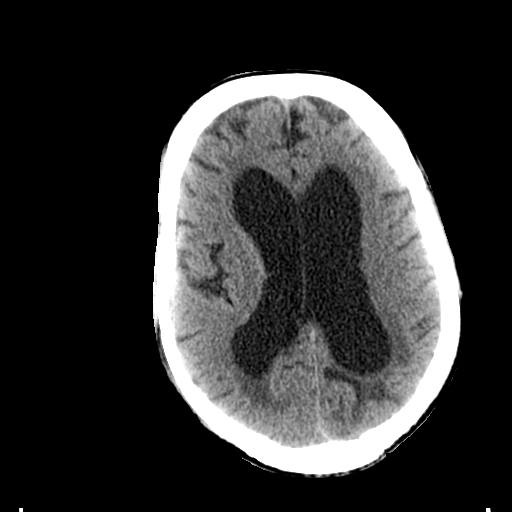
[im 20/36  bone]
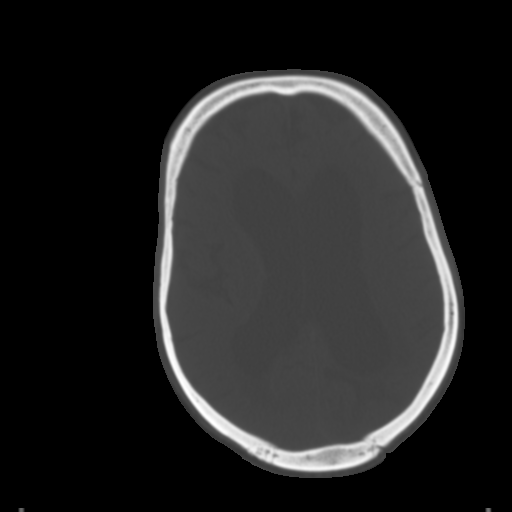
[im 22/36  brain]
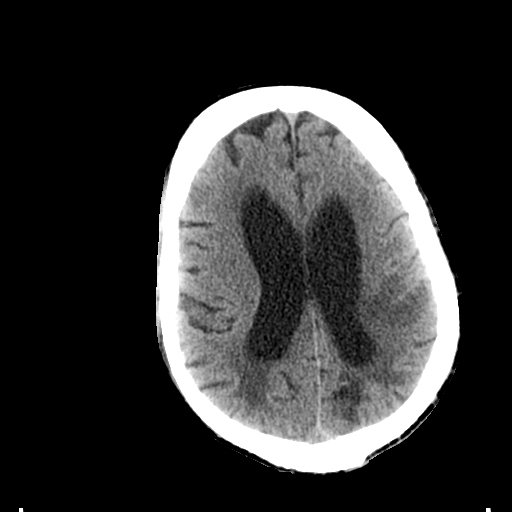
[im 25/36  brain]
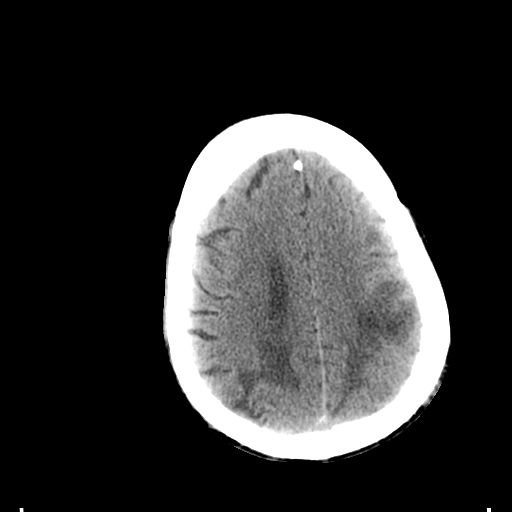
[im 27/36  brain]
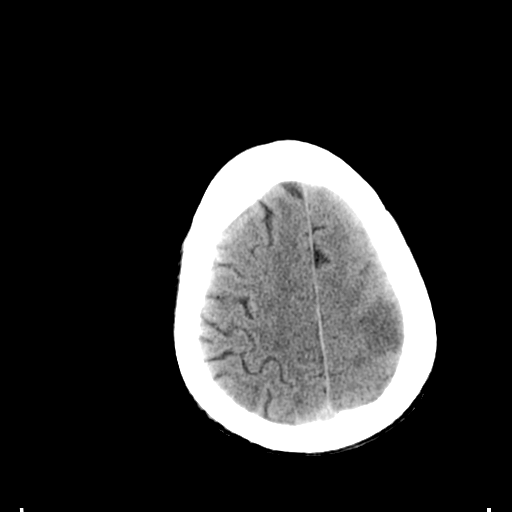
[im 29/36  brain]
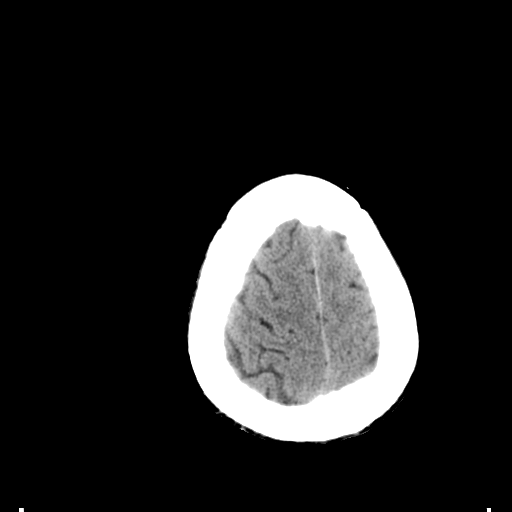
[im 29/36  bone]
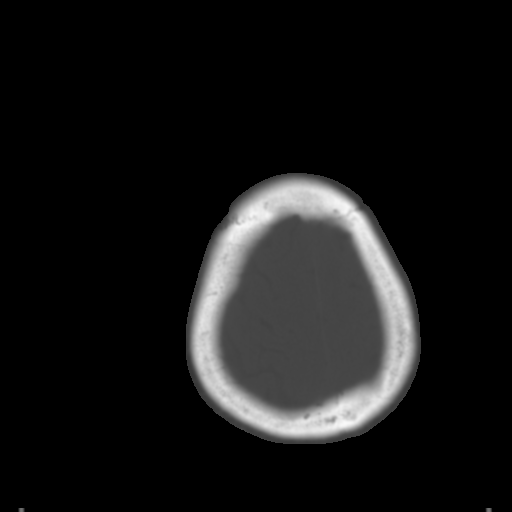
[im 32/36  brain]
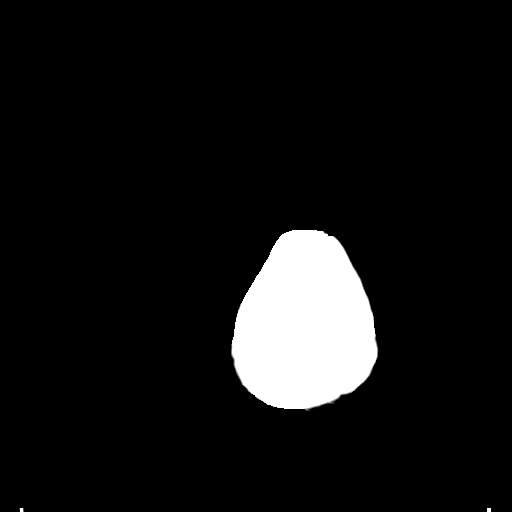
[im 34/36  brain]
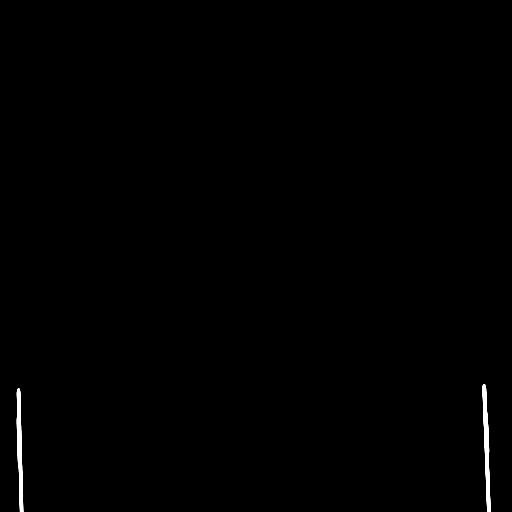

[15 of 30 positions shown; findings below may reference images not displayed]

FINDINGS: Since the previous study, the extensive intraparenchymal and
intraventricular hemorrhage has resolved. There is now diffuse
cerebral atrophy. Prominent ventricular dilatation. Low-attenuation
changes in the posterior deep white matter with extension to the
cortical surface in the occipital regions bilaterally, right
frontal, and left parietal regions. Old encephalomalacia also
demonstrated in the left cerebellum. These changes corresponding
areas of abnormality on the prior study. No mass effect or midline
shift today. No abnormal extra-axial fluid collections. Basal
cisterns are not effaced. No evidence of acute intracranial
hemorrhage. No depressed skull fractures. Mucosal thickening and
retention cysts in the paranasal sinuses.
IMPRESSION: No acute intracranial abnormalities. Old infarcts and
encephalomalacia in the cerebral hemispheres bilaterally and in the
left cerebellum. Ventricular dilatation bilaterally. Chronic
atrophy.
